# Patient Record
Sex: Male | Born: 1950
Health system: Southern US, Community
[De-identification: ages and names within clinical notes are randomized; demographics above are authoritative.]

## PROBLEM LIST (undated history)

## (undated) DIAGNOSIS — H409 Unspecified glaucoma: Secondary | ICD-10-CM

## (undated) DIAGNOSIS — T7840XA Allergy, unspecified, initial encounter: Secondary | ICD-10-CM

## (undated) DIAGNOSIS — M1990 Unspecified osteoarthritis, unspecified site: Secondary | ICD-10-CM

## (undated) DIAGNOSIS — I10 Essential (primary) hypertension: Secondary | ICD-10-CM

## (undated) DIAGNOSIS — H269 Unspecified cataract: Secondary | ICD-10-CM

## (undated) DIAGNOSIS — F1910 Other psychoactive substance abuse, uncomplicated: Secondary | ICD-10-CM

## (undated) DIAGNOSIS — G44009 Cluster headache syndrome, unspecified, not intractable: Secondary | ICD-10-CM

## (undated) DIAGNOSIS — N183 Chronic kidney disease, stage 3 (moderate): Secondary | ICD-10-CM

## (undated) HISTORY — DX: Allergy, unspecified, initial encounter: T78.40XA

## (undated) HISTORY — DX: Unspecified glaucoma: H40.9

## (undated) HISTORY — DX: Essential (primary) hypertension: I10

## (undated) HISTORY — DX: Other psychoactive substance abuse, uncomplicated: F19.10

## (undated) HISTORY — DX: Unspecified cataract: H26.9

## (undated) HISTORY — DX: Chronic kidney disease, stage 3 (moderate): N18.3

## (undated) HISTORY — DX: Unspecified osteoarthritis, unspecified site: M19.90

## (undated) HISTORY — PX: WISDOM TOOTH EXTRACTION: SHX21

---

## 2002-07-29 HISTORY — PX: OTHER SURGICAL HISTORY: SHX169

## 2008-04-22 ENCOUNTER — Ambulatory Visit: Payer: Self-pay | Admitting: Occupational Medicine

## 2008-04-22 DIAGNOSIS — I10 Essential (primary) hypertension: Secondary | ICD-10-CM | POA: Insufficient documentation

## 2008-04-22 HISTORY — DX: Essential (primary) hypertension: I10

## 2008-04-26 ENCOUNTER — Encounter (INDEPENDENT_AMBULATORY_CARE_PROVIDER_SITE_OTHER): Payer: Self-pay | Admitting: Occupational Medicine

## 2008-05-02 ENCOUNTER — Ambulatory Visit: Payer: Self-pay | Admitting: Family Medicine

## 2008-05-16 ENCOUNTER — Encounter: Payer: Self-pay | Admitting: Family Medicine

## 2008-05-17 LAB — CONVERTED CEMR LAB
Alkaline Phosphatase: 59 units/L (ref 39–117)
Cholesterol: 205 mg/dL — ABNORMAL HIGH (ref 0–200)
Glucose, Bld: 95 mg/dL (ref 70–99)
HDL: 39 mg/dL — ABNORMAL LOW (ref >39)
LDL Cholesterol: 137 mg/dL — ABNORMAL HIGH (ref 0–99)
LDL Goal: 130 mg/dL
Sodium: 140 meq/L (ref 135–145)
Total Bilirubin: 0.6 mg/dL (ref 0.3–1.2)
Total Protein: 7.5 g/dL (ref 6.0–8.3)
Triglycerides: 144 mg/dL (ref <150)
VLDL: 29 mg/dL (ref 0–40)

## 2009-04-26 ENCOUNTER — Ambulatory Visit: Payer: Self-pay | Admitting: Family Medicine

## 2009-04-27 LAB — CONVERTED CEMR LAB
BUN: 25 mg/dL — ABNORMAL HIGH (ref 6–23)
CO2: 21 meq/L (ref 19–32)
Calcium: 10 mg/dL (ref 8.4–10.5)
Chloride: 99 meq/L (ref 96–112)
Cholesterol: 208 mg/dL — ABNORMAL HIGH (ref 0–200)
Creatinine, Ser: 1.52 mg/dL — ABNORMAL HIGH (ref 0.40–1.50)
HDL: 44 mg/dL (ref >39)
LDL Cholesterol: 141 mg/dL — ABNORMAL HIGH (ref 0–99)
Total CHOL/HDL Ratio: 4.7
Triglycerides: 117 mg/dL (ref <150)

## 2009-05-04 ENCOUNTER — Encounter: Payer: Self-pay | Admitting: Family Medicine

## 2009-06-09 ENCOUNTER — Encounter: Payer: Self-pay | Admitting: Family Medicine

## 2009-06-09 HISTORY — PX: COLONOSCOPY: SHX174

## 2009-11-08 ENCOUNTER — Ambulatory Visit: Payer: Self-pay | Admitting: Family Medicine

## 2009-11-08 DIAGNOSIS — K219 Gastro-esophageal reflux disease without esophagitis: Secondary | ICD-10-CM

## 2009-11-08 HISTORY — DX: Gastro-esophageal reflux disease without esophagitis: K21.9

## 2010-08-28 NOTE — Assessment & Plan Note (Signed)
Summary: GERD   Vital Signs:  Patient profile:   60 year old male Height:      66.5 inches Weight:      209 pounds Pulse rate:   110 / minute BP sitting:   102 / 70  (left arm) Cuff size:   large  Vitals Entered By: Jose Waller (November 08, 2009 11:02 AM) CC: chest pain, heartburn, pain in left arm, sweat, nausea, flatulence happens alot after eating. Symptoms for 1 month now   Primary Care Provider:  Nani Gasser MD  CC:  chest pain, heartburn, pain in left arm, sweat, nausea, and flatulence happens alot after eating. Symptoms for 1 month now.  History of Present Illness: chest pain, heartburn, pain in left arm, sweat, nausea, flatulence happens alot after eating. Symptoms for 1 month now.  Cough when lasys down.  Occ gets pain down his Left arm.  Started a month ago.  Still very active and has not bothered him with activity. Often bothers him at night. No vomiting. Wrose  after eating pork skins last week.  Jose Waller had a cup of coffee to try to help himself burp.  Hx of reflux, but never this severe. Eats TUMS daily like they are candy. Do help but very brief. Also tried some Zantac but felt it didn't really work.  No hx of heart dz.    Current Medications (verified): 1)  Lisinopril-Hydrochlorothiazide 10-12.5 Mg Tabs (Lisinopril-Hydrochlorothiazide) .... Take One Tab Once Daily 2)  Pravastatin Sodium 20 Mg Tabs (Pravastatin Sodium) .... Take 1 Tablet By Mouth Once A Day At Bedtime  Allergies (verified): 1)  ! Prednisone  Comments:  Nurse/Medical Assistant: The patient's medications and allergies were reviewed with the patient and were updated in the Medication and Allergy Lists. Jose Waller (November 08, 2009 11:03 AM)  Family History: Reviewed history from 05/02/2008 and no changes required. alcholism,  Hi cholesterol  Father died Lung Ca, miner HTN Mom with dementia  Social History: Reviewed history from 04/26/2009 and no changes required. Married to Jose Waller with 2  kids. ON disability.   Former Smoker Alcohol use-no Regular exercise-yes  Physical Exam  General:  Well-developed,well-nourished,in no acute distress; alert,appropriate and cooperative throughout examination Head:  Normocephalic and atraumatic without obvious abnormalities. No apparent alopecia or balding. Mouth:  Oral mucosa and oropharynx without lesions or exudates.  Teeth in good repair. Neck:  No deformities, masses, or tenderness noted. Lungs:  Normal respiratory effort, chest expands symmetrically. Lungs are clear to auscultation, no crackles or wheezes. Heart:  Normal rate and regular rhythm. S1 and S2 normal without gallop, murmur, click, rub or other extra sounds. No carotid or abdominal bruits.   Abdomen:  Bowel sounds positive,abdomen soft and non-tender without masses, organomegaly or hernias noted. Skin:  no rashes.   Cervical Nodes:  No lymphadenopathy noted Psych:  Cognition and judgment appear intact. Alert and cooperative with normal attention span and concentration. No apparent delusions, illusions, hallucinations   Impression & Recommendations:  Problem # 1:  GERD (ICD-530.81) Unlikely cardiac but since was having pain radiating into his left arm did to an EKG. EKG shows rate 102 bpm, no acute changes.  Decreased amplitude in Lead 3, AVF.  This is reassuring.,  Discussed likley Severe GERD and his sxs are classic and eating certain foods make it worse.  He did feel relief after Gi cocktail here in teh office.  Dsicussed trial of PPI. Sample of Protonix given for 5 days and rx sent for omeprazole 40mg .  Dsicussed reflux measures. I not much better in one week then call back and will get labs and  consider stress test for his heart but this is less likely.  If does well on the PPI, then recommend tx for 8 weeks and then try to wean. Can use TUMS ore malloxx for acute reiief for the next few days.   His updated medication list for this problem includes:    Omeprazole 40 Mg  Cpdr (Omeprazole) .Marland Kitchen... Take 1 tablet by mouth once a day about 20-30 min before breakfast.  Problem # 2:  CHEST PAIN, ATYPICAL (ICD-786.59) See above.  Orders: EKG w/ Interpretation (93000)  Complete Medication List: 1)  Lisinopril-hydrochlorothiazide 10-12.5 Mg Tabs (Lisinopril-hydrochlorothiazide) .... Take one tab once daily 2)  Pravastatin Sodium 20 Mg Tabs (Pravastatin sodium) .... Take 1 tablet by mouth once a day at bedtime 3)  Omeprazole 40 Mg Cpdr (Omeprazole) .... Take 1 tablet by mouth once a day about 20-30 min before breakfast.  Patient Instructions: 1)  Avoid caffeine, greasy, spicey foods. Also avoid soda and carbonated beverages.  2)  Don't eat before bedtime.  Dont' overeat.  3)  Trial of omeprazole. CAll if not better in one week.  Prescriptions: OMEPRAZOLE 40 MG CPDR (OMEPRAZOLE) Take 1 tablet by mouth once a day about 20-30 min before breakfast.  #30 x 2   Entered and Authorized by:   Jose Gasser MD   Signed by:   Jose Gasser MD on 11/08/2009   Method used:   Electronically to        [facility] [phone]* (retail)       [address] [place], [place]  [address]       Ph: [phone]       Fax: [phone]   RxID:   [id number] LISINOPRIL-HYDROCHLOROTHIAZIDE 10-12.5 MG TABS (LISINOPRIL-HYDROCHLOROTHIAZIDE) take one tab once daily  #90 Each x 2   Entered by:   Jose Waller   Authorized by:   Jose Gasser MD   Signed by:   Jose Gasser MD on 11/08/2009   Method used:   Electronically to        [facility] [phone]* (retail)       [address] [place], [place]  [address]       Ph: [phone]       Fax: [phone]   RxID:   [id number] PRAVASTATIN SODIUM 20 MG TABS (PRAVASTATIN SODIUM) Take 1 tablet by mouth once a day at bedtime  #30 x 2   Entered by:   Jose Waller   Authorized by:   Jose Gasser MD   Signed by:   Jose Gasser MD on 11/08/2009   Method used:   Electronically to        [facility] [phone]* (retail)       [address] [place], [place]  [address]       Ph: [phone]       Fax: [phone]   RxID:   [id number]

## 2010-09-14 ENCOUNTER — Telehealth: Payer: Self-pay | Admitting: Family Medicine

## 2010-09-25 NOTE — Progress Notes (Signed)
Summary: Appt for bp med refill?  Phone Note Call from Patient Call back at [phone]   Caller: Patient Summary of Call: pt needs a refill for his bp meds, does he need to make an appt? Initial call taken by: Lannette Donath,  September 14, 2010 8:10 AM  Follow-up for Phone Call        Yes, will refill time one but needs to get in to office in the next couple of weeks.  Follow-up by: Nani Gasser MD,  September 14, 2010 1:08 PM  Additional Follow-up for Phone Call Additional follow up Details #1::        SW pt, advised meds are ready to be picked up, pt will CB to make appt, advised pt appt needs to be made for next refill Additional Follow-up by: Lannette Donath,  September 14, 2010 5:04 PM  New Problems: HYPERTENSION (ICD-401.1)   New Problems: HYPERTENSION (ICD-401.1)

## 2010-12-30 ENCOUNTER — Encounter: Payer: Self-pay | Admitting: Family Medicine

## 2010-12-31 ENCOUNTER — Ambulatory Visit (INDEPENDENT_AMBULATORY_CARE_PROVIDER_SITE_OTHER): Payer: Medicare Other | Admitting: Family Medicine

## 2010-12-31 ENCOUNTER — Encounter: Payer: Self-pay | Admitting: Family Medicine

## 2010-12-31 DIAGNOSIS — E785 Hyperlipidemia, unspecified: Secondary | ICD-10-CM

## 2010-12-31 DIAGNOSIS — I10 Essential (primary) hypertension: Secondary | ICD-10-CM

## 2010-12-31 HISTORY — DX: Hyperlipidemia, unspecified: E78.5

## 2010-12-31 MED ORDER — LISINOPRIL-HYDROCHLOROTHIAZIDE 10-12.5 MG PO TABS: 1.0000 | ORAL_TABLET | Freq: Every day | ORAL | Status: DC

## 2010-12-31 MED ORDER — PRAVASTATIN SODIUM 20 MG PO TABS: 20.0000 mg | ORAL_TABLET | Freq: Every day | ORAL | Status: DC

## 2010-12-31 NOTE — Assessment & Plan Note (Signed)
Not well controlled. Off of meds for 3 months. Reminded to get back on diet and stop drinking soda. Will restart meds and f/u in 1 month. He has joined a study in [facility] for HTN so they are drawing labs there and he will have them sent over.

## 2010-12-31 NOTE — Progress Notes (Signed)
  Subjective:    Patient ID: CAITLIN HILLMER, male    DOB: 11/17/1950, 60 y.o.   MRN: [id number]  Hypertension This is a chronic problem. The current episode started more than 1 year ago. The problem has been gradually worsening since onset. The problem is uncontrolled. Agents associated with hypertension include NSAIDs. Risk factors for coronary artery disease include no known risk factors. Past treatments include nothing. Compliance problems: out of meds for about 3 months.     Has been taking zyrtec for seasonal allergies.    Review of Systems     Objective:   Physical Exam  Constitutional: He is oriented to person, place, and time. He appears well-developed and well-nourished.  HENT:  Head: Normocephalic and atraumatic.  Eyes: Conjunctivae are normal. Pupils are equal, round, and reactive to light.  Cardiovascular: Normal rate, regular rhythm and normal heart sounds.   Pulmonary/Chest: Effort normal and breath sounds normal.  Musculoskeletal: Normal range of motion. He exhibits no edema.  Neurological: He is alert and oriented to person, place, and time.  Skin: Skin is warm and dry.  Psychiatric: He has a normal mood and affect.          Assessment & Plan:

## 2010-12-31 NOTE — Patient Instructions (Signed)
Please schedule a physical in one month.

## 2010-12-31 NOTE — Assessment & Plan Note (Signed)
Discussed restarting med and have them send me copy of labs from the research study.

## 2011-02-01 ENCOUNTER — Other Ambulatory Visit: Payer: Self-pay | Admitting: Family Medicine

## 2011-02-11 ENCOUNTER — Telehealth: Payer: Self-pay | Admitting: Family Medicine

## 2011-02-11 NOTE — Telephone Encounter (Signed)
I got his chol numbers from teh reasearch study. His chol was up.  Has he restarted his pravastatin?

## 2011-02-12 ENCOUNTER — Ambulatory Visit (INDEPENDENT_AMBULATORY_CARE_PROVIDER_SITE_OTHER): Payer: Medicare Other | Admitting: Family Medicine

## 2011-02-12 ENCOUNTER — Encounter: Payer: Self-pay | Admitting: Family Medicine

## 2011-02-12 DIAGNOSIS — N400 Enlarged prostate without lower urinary tract symptoms: Secondary | ICD-10-CM

## 2011-02-12 DIAGNOSIS — Z2911 Encounter for prophylactic immunotherapy for respiratory syncytial virus (RSV): Secondary | ICD-10-CM

## 2011-02-12 DIAGNOSIS — Z125 Encounter for screening for malignant neoplasm of prostate: Secondary | ICD-10-CM

## 2011-02-12 DIAGNOSIS — R350 Frequency of micturition: Secondary | ICD-10-CM

## 2011-02-12 DIAGNOSIS — Z23 Encounter for immunization: Secondary | ICD-10-CM

## 2011-02-12 DIAGNOSIS — Z0000 Encounter for general adult medical examination without abnormal findings: Secondary | ICD-10-CM

## 2011-02-12 NOTE — Progress Notes (Signed)
Addended by: Ellsworth Lennox on: 02/12/2011 09:32 AM   Modules accepted: Orders

## 2011-02-12 NOTE — Progress Notes (Signed)
Subjective:    Patient ID: Jose Waller, male    DOB: 12/19/1950, 60 y.o.   MRN: [id number]  HPI Here for CPE today. No specific complaints.  He is involved in a research study for HTN and is now on lisinopril. Says like the change in his BP med as he is not having to urinate less frequently.   Bp looks great today.  I just reviewed his lab results last night from the study. Chol was high.    Review of Systems Comprehensive ROS is neg.    BP 117/76  Pulse 80  Ht 5\' 7"  (1.702 m)  Wt 216 lb (97.977 kg)  BMI 33.83 kg/m2  SpO2 96%    Allergies  Allergen Reactions  . Prednisone     REACTION: hick ups    No past medical history on file.  Past Surgical History  Procedure Date  . Back lumbar 2004    fusion     History   Social History  . Marital Status: Married    Spouse Name: N/A    Number of Children: 2  . Years of Education: N/A   Occupational History  . Not on file.   Social History Main Topics  . Smoking status: Former Smoker    Quit date: 07/29/2001  . Smokeless tobacco: Not on file  . Alcohol Use: No  . Drug Use: Not on file     Former cocaine user, quit 1998  . Sexually Active: Not on file     married, 2 kids, on disability, regularly exercises.   Other Topics Concern  . Not on file   Social History Narrative   On disability.  Some exercise.     Family History  Problem Relation Age of Onset  . Lung cancer Father     lung/ was a Hydrologist  . Alcohol abuse Other   . Hypertension Brother   . Dementia Mother   . Coronary artery disease Brother     Jose Waller had no medications administered during this visit.     Objective:   Physical Exam  Constitutional: He is oriented to person, place, and time. He appears well-developed and well-nourished.  HENT:  Head: Normocephalic and atraumatic.  Right Ear: External ear normal.  Left Ear: External ear normal.  Nose: Nose normal.  Mouth/Throat: Oropharynx is clear and moist.  Eyes: Conjunctivae and EOM  are normal. Pupils are equal, round, and reactive to light.  Neck: Normal range of motion. Neck supple. No thyromegaly present.  Cardiovascular: Normal rate, regular rhythm, normal heart sounds and intact distal pulses.   Pulmonary/Chest: Effort normal and breath sounds normal.  Abdominal: Soft. Bowel sounds are normal. He exhibits no distension and no mass. There is no tenderness. There is no rebound and no guarding.  Genitourinary: Prostate is enlarged.       2+ enlarged. No nodules or bogginess.   Musculoskeletal: Normal range of motion.  Lymphadenopathy:    He has no cervical adenopathy.  Neurological: He is alert and oriented to person, place, and time. He has normal reflexes.  Skin: Skin is warm and dry.  Psychiatric: He has a normal mood and affect. His behavior is normal. Judgment and thought content normal.          Assessment & Plan:  CPE: Exam is nl except enlaged prostate Start a regular exercise program and make sure you are eating a healthy diet Your vaccines are up to date. Zostavax given today.  Need to restart  chole med and then recheck in about 2 months.

## 2011-02-12 NOTE — Patient Instructions (Signed)
Start a regular exercise program and make sure you are eating a healthy diet Please restart your cholesterol medicatoin.  Your vaccines are up to date.

## 2011-02-13 ENCOUNTER — Telehealth: Payer: Self-pay | Admitting: Family Medicine

## 2011-02-13 NOTE — Telephone Encounter (Signed)
Pt contacted and given recent lab result from the research study and told chol elev.  Asked the pt had he restarted the pravastatin and he said, "yes."  He restarted last night, and wasn't on the pravastatin when he had the study. Jarvis Newcomer, LPN [facility]

## 2011-02-13 NOTE — Telephone Encounter (Signed)
LMOM for pt that PSA normal and to recheck in 1 year. Jose Newcomer, LPN [facility]

## 2011-02-13 NOTE — Telephone Encounter (Signed)
Call pt: PSA is OK. Recheck in one year.

## 2011-04-17 ENCOUNTER — Other Ambulatory Visit: Payer: Self-pay | Admitting: Family Medicine

## 2011-12-30 ENCOUNTER — Other Ambulatory Visit: Payer: Self-pay | Admitting: Family Medicine

## 2012-07-16 ENCOUNTER — Other Ambulatory Visit: Payer: Self-pay | Admitting: Family Medicine

## 2012-07-16 NOTE — Telephone Encounter (Signed)
Must make appointment 

## 2013-01-17 ENCOUNTER — Emergency Department (INDEPENDENT_AMBULATORY_CARE_PROVIDER_SITE_OTHER)
Admission: EM | Admit: 2013-01-17 | Discharge: 2013-01-17 | Disposition: A | Discharge status: Home / Self Care | Payer: Medicare Other | Source: Home / Self Care | Attending: Emergency Medicine | Admitting: Emergency Medicine

## 2013-01-17 DIAGNOSIS — G44019 Episodic cluster headache, not intractable: Secondary | ICD-10-CM

## 2013-01-17 HISTORY — DX: Cluster headache syndrome, unspecified, not intractable: G44.009

## 2013-01-17 MED ORDER — METHYLPREDNISOLONE 4 MG PO KIT: PACK | ORAL | Status: DC

## 2013-01-17 MED ORDER — KETOROLAC TROMETHAMINE 60 MG/2ML IM SOLN
60.0000 mg | Freq: Once | INTRAMUSCULAR | Status: AC
  Administered 2013-01-17: 60 mg via INTRAMUSCULAR

## 2013-01-17 MED ORDER — IBUPROFEN 800 MG PO TABS: 800.0000 mg | ORAL_TABLET | Freq: Three times a day (TID) | ORAL | Status: DC | PRN

## 2013-01-17 NOTE — ED Provider Notes (Addendum)
History     CSN: [id number]  Arrival date & time 01/17/13  1153   First MD Initiated Contact with Patient-Sunday 01/17/13 1209      Chief Complaint  Patient presents with  . Migraine   Chief complaint: Acute Cluster migraine right side head  Patient is a 62 y.o. male presenting with migraines. The history is provided by the patient.  Migraine This is a recurrent problem. The current episode started 2 days ago. The problem occurs constantly. The problem has been gradually improving. Associated symptoms include headaches. Pertinent negatives include no chest pain, no abdominal pain and no shortness of breath. Associated symptoms comments: Other pertinent negatives: No blurred vision, scotomas, vision change, photophobia, phonophobia, nausea, vomiting, fever, numbness or focal weakness. No syncope. Exacerbated by: Eating chocolate, and cheese. And being outside in hot humid weather. Relieved by: Ibuprofen 200 mg helped somewhat, he reports Motrin 800 mg has worked well in the past.--Tried warm compresses and caffeinated coffee, and that helped somewhat. He has tried a warm compress for the symptoms. The treatment provided mild relief.   Acute onset of the deep, boring, right-sided parietal and orbital and maxillary headache. He awoke with this yesterday morning, which he states is typical for his prior cluster headaches in the past.--Last cluster headache was 8 years ago. He's gotten these episodically throughout his life every few years. He feels it was triggered by eating at a cookout, some foods containing tyramine as well as cheese and chocolate, and that's triggered his cluster headaches in the past. Pain reached a maximum 10 out of 10 when it awoke him from sleep early yesterday morning, but is now down to a 5/10 after using warm compresses and OTC ibuprofen. Associated symptoms: See above. Also, has congestion right nostril without discharge. He states this is typical for prior cluster  headaches .  Of note, he does not use alcohol,. He mentions cocaine addiction 20 years ago, and he quit over 10 years ago, so he requests to avoid any controlled, potentially addictive substances.  Past Medical History  Diagnosis Date  . Cluster headache     Past Surgical History  Procedure Laterality Date  . Back lumbar  2004    fusion     Family History  Problem Relation Age of Onset  . Lung cancer Father     lung/ was a Hydrologist  . Alcohol abuse Other   . Hypertension Brother   . Dementia Mother   . Coronary artery disease Brother     History  Substance Use Topics  . Smoking status: Former Smoker    Quit date: 07/29/2001  . Smokeless tobacco: Not on file  . Alcohol Use: No      Review of Systems  Constitutional: Negative for fever, chills and diaphoresis.  HENT: Positive for congestion. Negative for ear pain, sore throat, facial swelling, rhinorrhea, sneezing, trouble swallowing, neck pain, neck stiffness and postnasal drip.   Eyes: Positive for pain (Right). Negative for photophobia and visual disturbance.  Respiratory: Negative.  Negative for shortness of breath.   Cardiovascular: Negative.  Negative for chest pain.  Gastrointestinal: Negative.  Negative for nausea, vomiting and abdominal pain.  Genitourinary: Negative.   Allergic/Immunologic: Negative.   Neurological: Positive for headaches. Negative for dizziness, tremors, seizures, syncope, facial asymmetry, weakness and numbness.  Hematological: Negative.   Psychiatric/Behavioral: Negative.  Negative for hallucinations.    Allergies  Prednisone Associated with hiccoughs in the past. He specifically denies any history of allergic reaction, hives, swelling,  or itch when he took prednisone years ago.  Home Medications   Current Outpatient Rx  Name  Route  Sig  Dispense  Refill  . ibuprofen (ADVIL,MOTRIN) 800 MG tablet   Oral   Take 1 tablet (800 mg total) by mouth every 8 (eight) hours as needed for  pain. Take with food   21 tablet   0   . lisinopril (PRINIVIL,ZESTRIL) 5 MG tablet   Oral   Take 5 mg by mouth daily.           . methylPREDNISolone (MEDROL DOSEPAK) 4 MG tablet      Take as directed for 6 days   21 tablet   0   . omeprazole (PRILOSEC) 40 MG capsule      TAKE ONE CAPSULE BY MOUTH EVERY DAY ABOUT 20-30 MINUTES BEFORE BREAKFAST   30 capsule   0     Must make appointment before any further refills   . pravastatin (PRAVACHOL) 20 MG tablet      TAKE ONE TABLET BY MOUTH AT BEDTIME   30 tablet   3     BP 131/85  Pulse 96  Temp(Src) 98.1 F (36.7 C)  Ht 5' 6.5" (1.689 m)  Wt 222 lb (100.699 kg)  BMI 35.3 kg/m2  SpO2 97%  Physical Exam  Constitutional: He is oriented to person, place, and time. Vital signs are normal. He appears well-developed and well-nourished. He appears distressed (mild distress from R sided headache).  HENT:  Head: Normocephalic and atraumatic. Head is without contusion.  Right Ear: Hearing, tympanic membrane and ear canal normal.  Left Ear: Hearing, tympanic membrane and ear canal normal.  Nose: Nose normal.  Mouth/Throat: Oropharynx is clear and moist and mucous membranes are normal.  Eyes: EOM are normal. Pupils are equal, round, and reactive to light. Right eye exhibits no discharge. Left eye exhibits no discharge. No scleral icterus.  Fundoscopic exam:      The right eye shows no hemorrhage and no papilledema.       The left eye shows no hemorrhage and no papilledema.  Neck: Full passive range of motion without pain. Neck supple. No Brudzinski's sign and no Kernig's sign noted.  Cardiovascular: Normal rate and regular rhythm.   Pulmonary/Chest: Effort normal and breath sounds normal. No respiratory distress.  Neurological: He is alert and oriented to person, place, and time. He has normal strength and normal reflexes. No cranial nerve deficit or sensory deficit. Gait normal. GCS eye subscore is 4. GCS verbal subscore is 5. GCS  motor subscore is 6.  Psychiatric: He has a normal mood and affect. His speech is normal and behavior is normal. Thought content normal.    ED Course  Procedures (including critical care time)  Labs Reviewed - No data to display No results found.   1. Episodic cluster headache       MDM  Classical presentation for right-sided cluster headache. Consistent with prior diagnosis of cluster headaches in the past. No clinical evidence of any acute intracranial process. We discussed treatment options. Were avoiding any narcotic pain medication with his prior history of addiction. He mentions that Imitrex in the past cause chest pain, so we're avoiding Imitrex or other Tryptans at this time. After risks, benefits, alternatives discussed, he agrees with the following plans: Toradol 60 mg IM stat.--he was observed for over 15 min., without any side effects, and headache improved somewhat. Given that we're avoiding Tryptans, I explained that prednisone or similar corticosteroid in  a tapering dose is the first-line treatment to break a cluster headache.-We reviewed that he associated prednisone with hiccoughs in the past but no real allergic reaction.-- Therefore, after risk, benefits, alternatives discussed, I prescribed 6-day Medrol dose pack taper. I prescribed Motrin 800 mg Q8 hours PC. Prn headache, as this dosage worked in the past.-Precautions discussed. Red flags discussed. Other symptomatic care discussed.--discussed foods to avoid. Follow-up with PCP within 3 days, or go to ER sooner if any worsening or new symptoms. He voiced understanding and agreement.        Lajean Manes, MD 01/17/13 1534  Lajean Manes, MD 01/17/13 1539

## 2013-01-17 NOTE — ED Notes (Signed)
Hx of cluster headaches, this episode of headaches started about one week ago.  States it mimics a sinus infection.

## 2013-09-06 ENCOUNTER — Telehealth: Payer: Self-pay | Admitting: *Deleted

## 2013-09-06 NOTE — Telephone Encounter (Signed)
Pt informed that Dr. Linford ArnoldMetheney has been out of the office and will not be back until later on in the week. He stated that he needed a form filled out stating that he is disabled. I looked back in his chart to have another provider complete this for him and noticed that pt hasn't been seen by Dr. Linford ArnoldMetheney since 2012 which would mean that he will need to re-establish care. I informed him that he would need to make an appt either way due to the his circumstance we could not possibly complete this form w/o seeing him first. Pt voiced understanding and I offered him an appt w/ Dr. Benjamin Stainhekkekandam on 2/12 @ 1045. He will be seen then and told that we can complete the form at that time. Laureen Ochs.Mauria Asquith, Viann Shoveonya Lynetta

## 2013-09-08 ENCOUNTER — Ambulatory Visit (INDEPENDENT_AMBULATORY_CARE_PROVIDER_SITE_OTHER): Payer: Medicare Other | Admitting: Family Medicine

## 2013-09-08 ENCOUNTER — Encounter: Payer: Self-pay | Admitting: Family Medicine

## 2013-09-08 VITALS — BP 137/91 | HR 84 | Ht 67.0 in | Wt 229.0 lb

## 2013-09-08 DIAGNOSIS — G43909 Migraine, unspecified, not intractable, without status migrainosus: Secondary | ICD-10-CM

## 2013-09-08 DIAGNOSIS — K219 Gastro-esophageal reflux disease without esophagitis: Secondary | ICD-10-CM

## 2013-09-08 DIAGNOSIS — M545 Low back pain, unspecified: Secondary | ICD-10-CM

## 2013-09-08 DIAGNOSIS — I10 Essential (primary) hypertension: Secondary | ICD-10-CM

## 2013-09-08 DIAGNOSIS — E785 Hyperlipidemia, unspecified: Secondary | ICD-10-CM

## 2013-09-08 HISTORY — DX: Low back pain, unspecified: M54.50

## 2013-09-08 HISTORY — DX: Migraine, unspecified, not intractable, without status migrainosus: G43.909

## 2013-09-08 MED ORDER — IBUPROFEN 800 MG PO TABS: 800.0000 mg | ORAL_TABLET | Freq: Three times a day (TID) | ORAL | Status: DC | PRN

## 2013-09-08 MED ORDER — OMEPRAZOLE 40 MG PO CPDR: 40.0000 mg | DELAYED_RELEASE_CAPSULE | Freq: Every day | ORAL | Status: DC

## 2013-09-08 MED ORDER — PRAVASTATIN SODIUM 20 MG PO TABS: ORAL_TABLET | ORAL | Status: DC

## 2013-09-08 NOTE — Progress Notes (Signed)
CC: Jose Waller is a 63 y.o. male is here for form for disability   Subjective: HPI:  Patient is requesting property tax exclusion paperwork to be filled out for his total and permanent disability of low back pain despite multilevel lumbar fusion since 2005 he has been receiving benefits.  Requesting refills on omeprazole. States that his gastric reflux is 100% resolved when taking his medication on a daily basis. He states occasionally will skip a dose for a few days and notices symptoms returned described as moderate epigastric discomfort radiating up the back of the sternum without any exertional component.  Requesting refills on ibuprofen which she takes for migraines which he gets approximately "a few times"a month these are always precipitated due to chocolate or other cocoa containing products. Denies any motor or sensory disturbances during after or prior history migraines they are 100% alleviated soon after he takes ibuprofen.  Requesting refills on pravastatin for hyperlipidemia which is not had an LDL checked for over 3 months that he knows of he also has not been taking his medication for greater than 3-6 months. Denies any known side effects we'll he was taken his medication  He has a history of essential hypertension he is seeing nephrology at [facility] for a blood pressure study on chart review his blood pressures have been normotensive at all recent visits. He believes he gets his kidney function checked regularly however I cannot find documentation of this. Denies chest pain, shortness of breath, orthopnea nor peripheral edema     Review Of Systems Outlined In HPI  Past Medical History  Diagnosis Date  . Cluster headache     Past Surgical History  Procedure Laterality Date  . Back lumbar  2004    fusion    Family History  Problem Relation Age of Onset  . Lung cancer Father     lung/ was a Glass blower/designer  . Alcohol abuse Other   . Hypertension Brother   .  Dementia Mother   . Coronary artery disease Brother     History   Social History  . Marital Status: Married    Spouse Name: N/A    Number of Children: 2  . Years of Education: N/A   Occupational History  . Not on file.   Social History Main Topics  . Smoking status: Former Smoker    Quit date: 07/29/2001  . Smokeless tobacco: Not on file  . Alcohol Use: No  . Drug Use: Not on file     Comment: Former cocaine user, quit 1998  . Sexual Activity: Not on file     Comment: married, 2 kids, on disability, regularly exercises.   Other Topics Concern  . Not on file   Social History Narrative   On disability.  Some exercise.      Objective: BP 137/91  Pulse 84  Ht 5' 7"  (1.702 m)  Wt 229 lb (103.874 kg)  BMI 35.86 kg/m2  General: Alert and Oriented, No Acute Distress HEENT: Pupils equal, round, reactive to light. Conjunctivae clear.  Moist mucous membranes pharynx unremarkable Lungs: Clear to auscultation bilaterally, no wheezing/ronchi/rales.  Comfortable work of breathing. Good air movement. Cardiac: Regular rate and rhythm. Normal S1/S2.  No murmurs, rubs, nor gallops.   Abdomen: Soft nontender Extremities: No peripheral edema.  Strong peripheral pulses.  Mental Status: No depression, anxiety, nor agitation. Skin: Warm and dry.  Assessment & Plan: Jose Waller was seen today for form for disability.  Diagnoses and associated orders  for this visit:  GERD - omeprazole (PRILOSEC) 40 MG capsule; Take 1 capsule (40 mg total) by mouth daily.  HYPERTENSION  Migraine - ibuprofen (ADVIL,MOTRIN) 800 MG tablet; Take 1 tablet (800 mg total) by mouth every 8 (eight) hours as needed. Take with food  Low back pain  Hyperlipidemia  Other Orders - pravastatin (PRAVACHOL) 20 MG tablet; TAKE ONE TABLET BY MOUTH AT BEDTIME    GERD: Controlled when taking omeprazole therefore restart daily 40 mg Hypertension: Well controlled, if he cannot provide [place] with a basic metabolic panel at  his next visit would be wise for her office to obtain this since I cannot see it in care everywhere Migraine: Controlled continue as needed ibuprofen Hyperlipidemia: Restart pravastatin and return in 3 months for fasting cholesterol His property tax form requires a physician that has been caring for the patient prior to January 1 of this year in order for completion since this is the first time I've met this patient will leave the form for his PCP upon arrival.    Return in about 3 months (around 12/06/2013) for Cholesterol FU.

## 2013-09-09 ENCOUNTER — Ambulatory Visit: Payer: Medicare Other | Admitting: Sports Medicine

## 2013-09-22 ENCOUNTER — Telehealth: Payer: Self-pay | Admitting: *Deleted

## 2013-09-22 NOTE — Telephone Encounter (Signed)
Called and informed pt that form is complete he requested that it be mailed to him. I told him that this will take a little longer. Pt voiced understanding .Loralee PacasBarkley, Cheyann [place]

## 2013-12-20 ENCOUNTER — Other Ambulatory Visit: Payer: Self-pay | Admitting: Family Medicine

## 2014-09-29 DIAGNOSIS — R197 Diarrhea, unspecified: Secondary | ICD-10-CM | POA: Diagnosis not present

## 2014-09-29 DIAGNOSIS — R112 Nausea with vomiting, unspecified: Secondary | ICD-10-CM | POA: Diagnosis not present

## 2014-09-29 DIAGNOSIS — D72828 Other elevated white blood cell count: Secondary | ICD-10-CM | POA: Diagnosis not present

## 2014-09-29 DIAGNOSIS — I10 Essential (primary) hypertension: Secondary | ICD-10-CM | POA: Diagnosis not present

## 2014-09-29 DIAGNOSIS — R000 Tachycardia, unspecified: Secondary | ICD-10-CM | POA: Diagnosis not present

## 2014-09-29 DIAGNOSIS — Z888 Allergy status to other drugs, medicaments and biological substances status: Secondary | ICD-10-CM | POA: Diagnosis not present

## 2014-09-29 DIAGNOSIS — K5730 Diverticulosis of large intestine without perforation or abscess without bleeding: Secondary | ICD-10-CM | POA: Diagnosis not present

## 2014-09-29 DIAGNOSIS — K429 Umbilical hernia without obstruction or gangrene: Secondary | ICD-10-CM | POA: Diagnosis not present

## 2015-04-19 ENCOUNTER — Ambulatory Visit (INDEPENDENT_AMBULATORY_CARE_PROVIDER_SITE_OTHER): Payer: Medicare Other | Admitting: Family Medicine

## 2015-04-19 ENCOUNTER — Encounter: Payer: Self-pay | Admitting: Family Medicine

## 2015-04-19 VITALS — BP 136/86 | HR 86 | Temp 98.4°F | Ht 67.0 in | Wt 230.0 lb

## 2015-04-19 DIAGNOSIS — K219 Gastro-esophageal reflux disease without esophagitis: Secondary | ICD-10-CM | POA: Diagnosis not present

## 2015-04-19 DIAGNOSIS — Z125 Encounter for screening for malignant neoplasm of prostate: Secondary | ICD-10-CM | POA: Diagnosis not present

## 2015-04-19 DIAGNOSIS — I10 Essential (primary) hypertension: Secondary | ICD-10-CM

## 2015-04-19 DIAGNOSIS — E785 Hyperlipidemia, unspecified: Secondary | ICD-10-CM

## 2015-04-19 DIAGNOSIS — Z0000 Encounter for general adult medical examination without abnormal findings: Secondary | ICD-10-CM | POA: Diagnosis not present

## 2015-04-19 LAB — COMPLETE METABOLIC PANEL WITH GFR
ALK PHOS: 67 U/L (ref 40–115)
ALT: 11 U/L (ref 9–46)
AST: 15 U/L (ref 10–35)
Albumin: 4.2 g/dL (ref 3.6–5.1)
BUN: 17 mg/dL (ref 7–25)
CHLORIDE: 105 mmol/L (ref 98–110)
CO2: 26 mmol/L (ref 20–31)
Calcium: 9.2 mg/dL (ref 8.6–10.3)
Creat: 1.3 mg/dL — ABNORMAL HIGH (ref 0.70–1.25)
GFR, EST NON AFRICAN AMERICAN: 57 mL/min — AB (ref >=60)
GFR, Est African American: 66 mL/min (ref >=60)
GLUCOSE: 79 mg/dL (ref 65–99)
Potassium: 4.6 mmol/L (ref 3.5–5.3)
SODIUM: 138 mmol/L (ref 135–146)
Total Bilirubin: 0.4 mg/dL (ref 0.2–1.2)
Total Protein: 6.7 g/dL (ref 6.1–8.1)

## 2015-04-19 LAB — LIPID PANEL
Cholesterol: 178 mg/dL (ref 125–200)
HDL: 36 mg/dL — AB (ref >=40)
LDL Cholesterol: 125 mg/dL (ref <130)
Total CHOL/HDL Ratio: 4.9 Ratio (ref <=5.0)
Triglycerides: 84 mg/dL (ref <150)
VLDL: 17 mg/dL (ref <30)

## 2015-04-19 MED ORDER — LISINOPRIL 5 MG PO TABS: 5.0000 mg | ORAL_TABLET | Freq: Every day | ORAL | Status: DC

## 2015-04-19 MED ORDER — OMEPRAZOLE 40 MG PO CPDR: 40.0000 mg | DELAYED_RELEASE_CAPSULE | Freq: Every day | ORAL | Status: DC

## 2015-04-19 NOTE — Patient Instructions (Signed)
Keep up a regular exercise program and make sure you are eating a healthy diet Try to eat 4 servings of dairy a day, or if you are lactose intolerant take a calcium with vitamin D daily.  Your vaccines are up to date.   

## 2015-04-19 NOTE — Progress Notes (Signed)
Subjective:    Patient ID: Jose Waller, male    DOB: 10/13/1950, 64 y.o.   MRN: [id number]  HPI  for complete physical today. He has not been here for several years. He was actively part of a research study through [facility] where he was actually getting regular care including regular checkups blood work etc. He does still take blood pressure medication as well as reflux medication. Unfortunately his wife was killed in a head-on car accident back in January. He is still going to grief counseling and therapy. He is now raising his 2 daughters by himself. One is in college at [employer] and is getting a marketing degree and doing well. The second is in high school at the [employer]. He also recently joined [employer] so that he can learn to speak more publicly about grieving and loss.   Review of Systems  hypertensive review of systems is negative.    Objective:   Physical Exam  Constitutional: He is oriented to person, place, and time. He appears well-developed and well-nourished.  HENT:  Head: Normocephalic and atraumatic.  Right Ear: External ear normal.  Left Ear: External ear normal.  Nose: Nose normal.  Mouth/Throat: Oropharynx is clear and moist.  Eyes: Conjunctivae and EOM are normal. Pupils are equal, round, and reactive to light.  Neck: Normal range of motion. Neck supple. No thyromegaly present.  Cardiovascular: Normal rate, regular rhythm, normal heart sounds and intact distal pulses.   No carotid bruits.   Pulmonary/Chest: Effort normal and breath sounds normal.  Abdominal: Soft. Bowel sounds are normal. He exhibits no distension and no mass. There is no tenderness. There is no rebound and no guarding.  Musculoskeletal: Normal range of motion.  Lymphadenopathy:    He has no cervical adenopathy.  Neurological: He is alert and oriented to person, place, and time. He has normal reflexes.  Skin: Skin is warm and dry.  Psychiatric: He has a normal mood  and affect. His behavior is normal. Judgment and thought content normal.          Assessment & Plan:   complete physical-  He is doing well. Due for fasting CMP and lipid panel as well as a PSA.  Declined pneumonia vaccination today. He says he will think about it.  Tetanus and shingles vaccie is up-to-date.   Hypertension-well-controlled continue current regimen. Follow-up in 6 months. Next  Hyperlipidemia-due to recheck lipid panel.  GERD-refilled his reflux medication.  Subjective:    [name] is a 64 y.o. male who presents for Medicare Annual/Subsequent preventive examination.   Preventive Screening-Counseling & Management  Tobacco History  Smoking status  . Former Smoker  . Quit date: 07/29/2001  Smokeless tobacco  . Not on file    Problems Prior to Visit 1.   Current Problems (verified) Patient Active Problem List   Diagnosis Date Noted  . Migraine 09/08/2013  . Low back pain 09/08/2013  . Hyperlipidemia 12/31/2010  . GERD 11/08/2009  . HYPERTENSION 04/22/2008    Medications Prior to Visit No current outpatient prescriptions on file prior to visit.   No current facility-administered medications on file prior to visit.    Current Medications (verified) Current Outpatient Prescriptions  Medication Sig Dispense Refill  . lisinopril (PRINIVIL,ZESTRIL) 5 MG tablet Take 1 tablet (5 mg total) by mouth daily. 90 tablet 1  . omeprazole (PRILOSEC) 40 MG capsule Take 1 capsule (40 mg total) by mouth daily. 90 capsule 1   No  current facility-administered medications for this visit.     Allergies (verified) Prednisone   PAST HISTORY  Family History Family History  Problem Relation Age of Onset  . Lung cancer Father     lung/ was a Hydrologist  . Alcohol abuse Other   . Hypertension Brother   . Dementia Mother   . Coronary artery disease Brother     Social History Social History  Substance Use Topics  . Smoking status: Former Smoker    Quit  date: 07/29/2001  . Smokeless tobacco: Not on file  . Alcohol Use: No    Are there smokers in your home (other than you)?  No  Risk Factors Current exercise habits: The patient does not participate in regular exercise at present.  Dietary issues discussed: None   Cardiac risk factors: advanced age (older than 39 for men, 59 for women), hypertension, male gender, obesity (BMI >= 30 kg/m2) and sedentary lifestyle.  Depression Screen (Note: if answer to either of the following is "Yes", a more complete depression screening is indicated)   Q1: Over the past two weeks, have you felt down, depressed or hopeless? No  Q2: Over the past two weeks, have you felt little interest or pleasure in doing things? No  Have you lost interest or pleasure in daily life? No  Do you often feel hopeless? No  Do you cry easily over simple problems? No  Activities of Daily Living In your present state of health, do you have any difficulty performing the following activities?:  Driving? No Managing money?  No Feeding yourself? No Getting from bed to chair? No Climbing a flight of stairs? No Preparing food and eating?: No Bathing or showering? No Getting dressed: No Getting to the toilet? No Using the toilet:No Moving around from place to place: No In the past year have you fallen or had a near fall?:No   Are you sexually active?  No  Do you have more than one partner?  No  Hearing Difficulties: No Do you often ask people to speak up or repeat themselves? No Do you experience ringing or noises in your ears? No Do you have difficulty understanding soft or whispered voices? No   Do you feel that you have a problem with memory? No  Do you often misplace items? No  Do you feel safe at home?  Yes  Cognitive Testing  Alert? Yes  Normal Appearance?Yes  Oriented to person? Yes  Place? Yes   Time? Yes  Recall of three objects?  Yes  Can perform simple calculations? Yes  Displays appropriate  judgment?Yes  Can read the correct time from a watch face?Yes   Advanced Directives have been discussed with the patient? Yes, given additional info.    List the Names of Other Physician/Practitioners you currently use: 1.    Indicate any recent Medical Services you may have received from other than [facility] providers in the past year (date may be approximate).  Immunization History  Administered Date(s) Administered  . Td 05/02/2008  . Zoster 02/12/2011    Screening Tests Health Maintenance  Topic Date Due  . Hepatitis C Screening  05-02-1951  . INFLUENZA VACCINE  04/18/2016 (Originally 02/27/2015)  . PNA vac Low Risk Adult (1 of 2 - PCV13) 04/18/2016 (Originally 12/13/2013)  . TETANUS/TDAP  05/02/2018  . COLONOSCOPY  06/10/2019  . ZOSTAVAX  Completed    All answers were reviewed with the patient and necessary referrals were made:  METHENEY,CATHERINE, MD   04/19/2015  History reviewed: allergies, current medications, past family history, past medical history, past social history, past surgical history and problem list  Review of Systems A comprehensive review of systems was negative.    Objective:     Vision by Snellen chart: right eye:20/25, left eye:20/20 Blood pressure 136/86, pulse 86, temperature 98.4 F (36.9 C), height  (1.702 m), weight 230 lb (104.327 kg), SpO2 96 %. Body mass index is 36.01 kg/(m^2).  BP 136/86 mmHg  Pulse 86  Temp(Src) 98.4 F (36.9 C)  Ht  (1.702 m)  Wt 230 lb (104.327 kg)  BMI 36.01 kg/m2  SpO2 96%  General Appearance:    Alert, cooperative, no distress, appears stated age  Head:    Normocephalic, without obvious abnormality, atraumatic  Eyes:    PERRL, conjunctiva/corneas clear, EOM's intact, benign, both eyes       Ears:    Normal TM's and external ear canals, both ears  Nose:   Nares normal, septum midline, mucosa normal, no drainage    or sinus tenderness  Throat:   Lips, mucosa, and tongue normal; teeth and gums normal   Neck:   Supple, symmetrical, trachea midline, no adenopathy;       thyroid:  No enlargement/tenderness/nodules; no carotid   bruit or JVD  Back:     Symmetric, no curvature, ROM normal, no CVA tenderness  Lungs:     Clear to auscultation bilaterally, respirations unlabored  Chest wall:    No tenderness or deformity  Heart:    Regular rate and rhythm, S1 and S2 normal, no murmur, rub   or gallop, no carotid bruits.   Abdomen:     Soft, non-tender, bowel sounds active all four quadrants,    no masses, no organomegaly  Genitalia:    Not examined  Rectal:    Not examined  Extremities:   Extremities normal, atraumatic, no cyanosis or edema  Pulses:   2+ and symmetric all extremities  Skin:   Skin color, texture, turgor normal, no rashes or lesions  Lymph nodes:   Cervical, supraclavicular,nodes normal  Neurologic:   CNII-XII intact. Normal strength, sensation and reflexes      throughout       Assessment:   Annual Medicare Wellness       Plan:     During the course of the visit the patient was educated and counseled about appropriate screening and preventive services including:    Pneumococcal vaccine   Influenza vaccine  Prostate cancer screening  Diet review for nutrition referral? Yes ____  Not Indicated _X__   Patient Instructions (the written plan) was given to the patient.  Medicare Attestation I have personally reviewed: The patient's medical and social history Their use of alcohol, tobacco or illicit drugs Their current medications and supplements The patient's functional ability including ADLs,fall risks, home safety risks, cognitive, and hearing and visual impairment Diet and physical activities Evidence for depression or mood disorders  The patient's weight, height, BMI, and visual acuity have been recorded in the chart.  I have made referrals, counseling, and provided education to the patient based on review of the above and I have provided the patient with  a written personalized care plan for preventive services.     METHENEY,CATHERINE, MD   04/19/2015

## 2015-04-20 LAB — PSA: PSA: 1.57 ng/mL (ref <=4.00)

## 2015-08-28 ENCOUNTER — Other Ambulatory Visit: Payer: Self-pay | Admitting: Family Medicine

## 2015-10-18 ENCOUNTER — Ambulatory Visit: Payer: Self-pay | Admitting: Family Medicine

## 2015-10-19 ENCOUNTER — Encounter: Payer: Self-pay | Admitting: Family Medicine

## 2015-10-19 ENCOUNTER — Ambulatory Visit (INDEPENDENT_AMBULATORY_CARE_PROVIDER_SITE_OTHER): Payer: Medicare Other | Admitting: Family Medicine

## 2015-10-19 VITALS — BP 107/79 | HR 101 | Wt 226.0 lb

## 2015-10-19 DIAGNOSIS — Z1159 Encounter for screening for other viral diseases: Secondary | ICD-10-CM

## 2015-10-19 DIAGNOSIS — K219 Gastro-esophageal reflux disease without esophagitis: Secondary | ICD-10-CM

## 2015-10-19 DIAGNOSIS — I10 Essential (primary) hypertension: Secondary | ICD-10-CM

## 2015-10-19 DIAGNOSIS — M545 Low back pain, unspecified: Secondary | ICD-10-CM

## 2015-10-19 MED ORDER — LISINOPRIL 5 MG PO TABS: 5.0000 mg | ORAL_TABLET | Freq: Every day | ORAL | Status: DC

## 2015-10-19 MED ORDER — OMEPRAZOLE 40 MG PO CPDR: 40.0000 mg | DELAYED_RELEASE_CAPSULE | Freq: Every day | ORAL | Status: DC

## 2015-10-19 MED ORDER — IBUPROFEN 800 MG PO TABS: 800.0000 mg | ORAL_TABLET | Freq: Three times a day (TID) | ORAL | Status: DC | PRN

## 2015-10-19 NOTE — Progress Notes (Signed)
   Subjective:    Patient ID: Jose Waller, male    DOB: 1950-10-16, 65 y.o.   MRN: [id number]  HPI Hypertension- Pt denies chest pain, SOB, dizziness, or heart palpitations.  Taking meds as directed w/o problems.  Denies medication side effects.    GERD - restarted his PPI a year ago.  He does have that that seems to control his reflux symptoms. He has been having more allergy symptoms recently but says he's been taking some over-the-counter MSM and that really seems to help his symptoms.  Low Back pain - He would like a refill on his ibuprofen. It does provide relief for him.  Review of Systems     Objective:   Physical Exam  Constitutional: He is oriented to person, place, and time. He appears well-developed and well-nourished.  HENT:  Head: Normocephalic and atraumatic.  Cardiovascular: Normal rate, regular rhythm and normal heart sounds.   Pulmonary/Chest: Effort normal and breath sounds normal.  Neurological: He is alert and oriented to person, place, and time.  Skin: Skin is warm and dry.  Psychiatric: He has a normal mood and affect. His behavior is normal.          Assessment & Plan:  HTN - Due for BMP. Well controlled. Continue current regimen. Follow up in 6 mo.   GERD- continue with PPI for symptom relief.  Low Back pain - did refill his ibuprofen today. Stop immediately if any GI upset or irritation.

## 2015-10-20 LAB — BASIC METABOLIC PANEL
BUN: 15 mg/dL (ref 7–25)
CHLORIDE: 104 mmol/L (ref 98–110)
CO2: 28 mmol/L (ref 20–31)
Calcium: 9.5 mg/dL (ref 8.6–10.3)
Creat: 1.31 mg/dL — ABNORMAL HIGH (ref 0.70–1.25)
Glucose, Bld: 82 mg/dL (ref 65–99)
Potassium: 4.5 mmol/L (ref 3.5–5.3)
Sodium: 139 mmol/L (ref 135–146)

## 2015-10-20 LAB — HEPATITIS C ANTIBODY: HCV AB: NEGATIVE

## 2016-04-22 ENCOUNTER — Encounter: Payer: Self-pay | Admitting: Family Medicine

## 2016-05-08 ENCOUNTER — Encounter: Payer: Self-pay | Admitting: Family Medicine

## 2016-05-08 ENCOUNTER — Ambulatory Visit (INDEPENDENT_AMBULATORY_CARE_PROVIDER_SITE_OTHER): Payer: Self-pay | Admitting: Family Medicine

## 2016-05-08 VITALS — BP 135/74 | HR 88 | Wt 226.0 lb

## 2016-05-08 DIAGNOSIS — I10 Essential (primary) hypertension: Secondary | ICD-10-CM

## 2016-05-08 DIAGNOSIS — Z125 Encounter for screening for malignant neoplasm of prostate: Secondary | ICD-10-CM

## 2016-05-08 DIAGNOSIS — K219 Gastro-esophageal reflux disease without esophagitis: Secondary | ICD-10-CM

## 2016-05-08 DIAGNOSIS — E785 Hyperlipidemia, unspecified: Secondary | ICD-10-CM

## 2016-05-08 MED ORDER — IBUPROFEN 800 MG PO TABS: 800.0000 mg | ORAL_TABLET | Freq: Three times a day (TID) | ORAL | 1 refills | Status: DC | PRN

## 2016-05-08 MED ORDER — OMEPRAZOLE 40 MG PO CPDR: 40.0000 mg | DELAYED_RELEASE_CAPSULE | Freq: Every day | ORAL | 1 refills | Status: DC

## 2016-05-08 MED ORDER — LISINOPRIL 5 MG PO TABS: 5.0000 mg | ORAL_TABLET | Freq: Every day | ORAL | 1 refills | Status: DC

## 2016-05-08 NOTE — Progress Notes (Signed)
Subjective:    CC: HTN  HPI:  Hypertension- Pt denies chest pain, SOB, dizziness, or heart palpitations.  Taking meds as directed w/o problems.  Denies medication side effects.    GERD- on prilosec 40mg  daily. Needs refill. Sxs well controlled.   Past medical history, Surgical history, Family history not pertinant except as noted below, Social history, Allergies, and medications have been entered into the medical record, reviewed, and corrections made.   Review of Systems: No fevers, chills, night sweats, weight loss, chest pain, or shortness of breath.   Objective:    General: Well Developed, well nourished, and in no acute distress.  Neuro: Alert and oriented x3, extra-ocular muscles intact, sensation grossly intact.  HEENT: Normocephalic, atraumatic  Skin: Warm and dry, no rashes. Cardiac: Regular rate and rhythm, no murmurs rubs or gallops, no lower extremity edema.  Respiratory: Clear to auscultation bilaterally. Not using accessory muscles, speaking in full sentences.   Impression and Recommendations:    HTN - Well controlled. Continue current regimen. Follow up in  6 mo.  Due for CMP and lipids.  GERD- refill med.   Hyperlipidemia-due to recheck lipid levels.  Due for PSA for prostate cancer screening.

## 2016-05-15 DIAGNOSIS — E785 Hyperlipidemia, unspecified: Secondary | ICD-10-CM | POA: Diagnosis not present

## 2016-05-15 DIAGNOSIS — Z125 Encounter for screening for malignant neoplasm of prostate: Secondary | ICD-10-CM | POA: Diagnosis not present

## 2016-05-15 DIAGNOSIS — I10 Essential (primary) hypertension: Secondary | ICD-10-CM | POA: Diagnosis not present

## 2016-05-16 LAB — LIPID PANEL
Cholesterol: 186 mg/dL (ref 125–200)
HDL: 37 mg/dL — ABNORMAL LOW (ref >=40)
LDL CALC: 131 mg/dL — AB (ref <130)
TRIGLYCERIDES: 90 mg/dL (ref <150)
Total CHOL/HDL Ratio: 5 Ratio (ref <=5.0)
VLDL: 18 mg/dL (ref <30)

## 2016-05-16 LAB — COMPLETE METABOLIC PANEL WITH GFR
ALBUMIN: 4.1 g/dL (ref 3.6–5.1)
ALT: 10 U/L (ref 9–46)
AST: 15 U/L (ref 10–35)
Alkaline Phosphatase: 71 U/L (ref 40–115)
BILIRUBIN TOTAL: 0.4 mg/dL (ref 0.2–1.2)
BUN: 19 mg/dL (ref 7–25)
CALCIUM: 9.3 mg/dL (ref 8.6–10.3)
CHLORIDE: 107 mmol/L (ref 98–110)
CO2: 24 mmol/L (ref 20–31)
Creat: 1.36 mg/dL — ABNORMAL HIGH (ref 0.70–1.25)
GFR, EST NON AFRICAN AMERICAN: 53 mL/min — AB (ref >=60)
GFR, Est African American: 62 mL/min (ref >=60)
Glucose, Bld: 93 mg/dL (ref 65–99)
POTASSIUM: 4.1 mmol/L (ref 3.5–5.3)
SODIUM: 140 mmol/L (ref 135–146)
Total Protein: 6.9 g/dL (ref 6.1–8.1)

## 2016-05-16 LAB — PSA: PSA: 1.5 ng/mL (ref <=4.0)

## 2016-06-05 ENCOUNTER — Telehealth: Payer: Self-pay | Admitting: Family Medicine

## 2016-06-05 NOTE — Telephone Encounter (Signed)
Pt stated he needs refills on BP MEDS

## 2016-06-05 NOTE — Telephone Encounter (Signed)
Called and informed pt that this was sent on 05/08/16 for #[id number].Jose PacasBarkley, [name]

## 2016-08-08 ENCOUNTER — Ambulatory Visit: Payer: Self-pay | Admitting: Family Medicine

## 2016-09-16 ENCOUNTER — Other Ambulatory Visit: Payer: Self-pay | Admitting: Family Medicine

## 2016-10-16 ENCOUNTER — Ambulatory Visit (INDEPENDENT_AMBULATORY_CARE_PROVIDER_SITE_OTHER): Payer: Medicare Other | Admitting: Family Medicine

## 2016-10-16 ENCOUNTER — Encounter: Payer: Self-pay | Admitting: Family Medicine

## 2016-10-16 VITALS — BP 137/87 | HR 95 | Ht 67.0 in | Wt 219.0 lb

## 2016-10-16 DIAGNOSIS — I10 Essential (primary) hypertension: Secondary | ICD-10-CM

## 2016-10-16 DIAGNOSIS — Z6834 Body mass index (BMI) 34.0-34.9, adult: Secondary | ICD-10-CM

## 2016-10-16 LAB — BASIC METABOLIC PANEL WITH GFR
BUN: 17 mg/dL (ref 7–25)
CALCIUM: 9.8 mg/dL (ref 8.6–10.3)
CO2: 28 mmol/L (ref 20–31)
CREATININE: 1.34 mg/dL — AB (ref 0.70–1.25)
Chloride: 105 mmol/L (ref 98–110)
GFR, EST NON AFRICAN AMERICAN: 54 mL/min — AB (ref >=60)
GFR, Est African American: 63 mL/min (ref >=60)
Glucose, Bld: 88 mg/dL (ref 65–99)
Potassium: 4.6 mmol/L (ref 3.5–5.3)
SODIUM: 140 mmol/L (ref 135–146)

## 2016-10-16 MED ORDER — LISINOPRIL 10 MG PO TABS: 10.0000 mg | ORAL_TABLET | Freq: Every day | ORAL | 1 refills | Status: DC

## 2016-10-16 NOTE — Progress Notes (Signed)
Subjective:    CC: HTN  HPI: Hypertension- Pt denies chest pain, SOB, dizziness, or heart palpitations.  Taking meds as directed w/o problems.  Denies medication side effects.    Obesity/BMI 34-he has actually lost about 7 pounds since I last saw him in October which is absolutely fantastic. Continue work on [employer] diet and regular exercise.  Past medical history, Surgical history, Family history not pertinant except as noted below, Social history, Allergies, and medications have been entered into the medical record, reviewed, and corrections made.   Review of Systems: No fevers, chills, night sweats, weight loss, chest pain, or shortness of breath.   Objective:    General: Well Developed, well nourished, and in no acute distress.  Neuro: Alert and oriented x3, extra-ocular muscles intact, sensation grossly intact.  HEENT: Normocephalic, atraumatic  Skin: Warm and dry, no rashes. Cardiac: Regular rate and rhythm, no murmurs rubs or gallops, no lower extremity edema.  Respiratory: Clear to auscultation bilaterally. Not using accessory muscles, speaking in full sentences.   Impression and Recommendations:   HTN - Well controlled overall but blood pressures have been running in the 130s. Like to increase his Toprol to 10 mg just to get that pressure pushed down just a little bit more. Recheck BMP today. Follow-up in 6 months.  BMI 34-great job on weight loss.

## 2016-11-07 ENCOUNTER — Ambulatory Visit: Payer: Medicare Other | Admitting: Family Medicine

## 2016-12-02 ENCOUNTER — Other Ambulatory Visit: Payer: Self-pay | Admitting: Family Medicine

## 2016-12-02 DIAGNOSIS — K219 Gastro-esophageal reflux disease without esophagitis: Secondary | ICD-10-CM

## 2017-02-23 ENCOUNTER — Other Ambulatory Visit: Payer: Self-pay | Admitting: Family Medicine

## 2017-04-09 ENCOUNTER — Other Ambulatory Visit: Payer: Self-pay | Admitting: Family Medicine

## 2017-04-09 ENCOUNTER — Ambulatory Visit: Payer: Self-pay | Admitting: Family Medicine

## 2017-04-09 DIAGNOSIS — I10 Essential (primary) hypertension: Secondary | ICD-10-CM

## 2017-04-22 ENCOUNTER — Ambulatory Visit (INDEPENDENT_AMBULATORY_CARE_PROVIDER_SITE_OTHER): Payer: Medicare Other | Admitting: Family Medicine

## 2017-04-22 ENCOUNTER — Encounter: Payer: Self-pay | Admitting: Family Medicine

## 2017-04-22 VITALS — BP 136/82 | HR 80 | Temp 98.3°F | Ht 67.0 in | Wt 223.0 lb

## 2017-04-22 DIAGNOSIS — E785 Hyperlipidemia, unspecified: Secondary | ICD-10-CM

## 2017-04-22 DIAGNOSIS — I10 Essential (primary) hypertension: Secondary | ICD-10-CM

## 2017-04-22 DIAGNOSIS — R0981 Nasal congestion: Secondary | ICD-10-CM | POA: Diagnosis not present

## 2017-04-22 NOTE — Progress Notes (Signed)
Subjective:    Patient ID: Jose Waller, male    DOB: 19-Sep-1950, 66 y.o.   MRN: [id number]  HPI Hypertension- Pt denies chest pain, SOB, dizziness, or heart palpitations.  Taking meds as directed w/o problems.  Denies medication side effects.    Hyperlipidemia - Not currently on statin.   Has had some sinus sxs for one days. Has had eye pressure and has been taking zyrtec. He says the Zyrtec makes him feel little groggy when he takes it. No cough. No fever, chills ore sweats.   Review of Systems  BP 136/82   Pulse 80   Temp 98.3 F (36.8 C)   Ht  (1.702 m)   Wt 223 lb (101.2 kg)   SpO2 98%   BMI 34.93 kg/m     Allergies  Allergen Reactions  . Prednisone     REACTION: hick ups    Past Medical History:  Diagnosis Date  . Cluster headache     Past Surgical History:  Procedure Laterality Date  . back lumbar  2004   fusion     Social History   Social History  . Marital status: Widowed    Spouse name: N/A  . Number of children: 2  . Years of education: N/A   Occupational History  . Not on file.   Social History Main Topics  . Smoking status: Former Smoker    Quit date: 07/29/2001  . Smokeless tobacco: Never Used  . Alcohol use No  . Drug use: Unknown     Comment: Former cocaine user, quit 1998  . Sexual activity: Not on file     Comment: married, 2 kids, on disability, regularly exercises.   Other Topics Concern  . Not on file   Social History Narrative   On disability.  Some exercise. Wife passed away in a motor vehicle accident. He is raising 2 daughters by himself.    Family History  Problem Relation Age of Onset  . Lung cancer Father        lung/ was a Hydrologist  . Alcohol abuse Other   . Hypertension Brother   . Dementia Mother   . Coronary artery disease Brother     Outpatient Encounter Prescriptions as of 04/22/2017  Medication Sig  . ibuprofen (ADVIL,MOTRIN) 800 MG tablet TAKE ONE TABLET BY MOUTH EVERY 8 HOURS WITH FOOD AS NEEDED  .  lisinopril (PRINIVIL,ZESTRIL) 10 MG tablet TAKE 1 TABLET BY MOUTH ONCE DAILY  . omeprazole (PRILOSEC) 40 MG capsule TAKE ONE CAPSULE BY MOUTH ONCE DAILY  . [DISCONTINUED] ibuprofen (ADVIL,MOTRIN) 800 MG tablet TAKE ONE TABLET BY MOUTH EVERY 8 HOURS WITH FOOD AS NEEDED   No facility-administered encounter medications on file as of 04/22/2017.          Objective:   Physical Exam  Constitutional: He is oriented to person, place, and time. He appears well-developed and well-nourished.  HENT:  Head: Normocephalic and atraumatic.  Right Ear: External ear normal.  Left Ear: External ear normal.  Nose: Nose normal.  Mouth/Throat: Oropharynx is clear and moist.  TMs and canals are clear.   Eyes: Pupils are equal, round, and reactive to light. Conjunctivae and EOM are normal.  Neck: Neck supple. No thyromegaly present.  Cardiovascular: Normal rate, regular rhythm and normal heart sounds.   Pulmonary/Chest: Effort normal and breath sounds normal.  Lymphadenopathy:    He has no cervical adenopathy.  Neurological: He is alert and oriented to person, place, and time.  Skin: Skin is warm and dry.  Psychiatric: He has a normal mood and affect. His behavior is normal.          Assessment & Plan:  HTN - Well controlled. Continue current regimen. Follow up in  6 months.   Hyperlipidemia - due for labs next months. Slit printed.  Sinus congestion-either viral or allergic. Okay to continue with Zyrtec. Call if not better in one week.

## 2017-06-25 ENCOUNTER — Other Ambulatory Visit: Payer: Self-pay | Admitting: Family Medicine

## 2017-06-25 DIAGNOSIS — K219 Gastro-esophageal reflux disease without esophagitis: Secondary | ICD-10-CM

## 2017-09-17 ENCOUNTER — Other Ambulatory Visit: Payer: Self-pay | Admitting: *Deleted

## 2017-09-17 DIAGNOSIS — I10 Essential (primary) hypertension: Secondary | ICD-10-CM

## 2017-09-17 MED ORDER — LISINOPRIL 10 MG PO TABS: 10.0000 mg | ORAL_TABLET | Freq: Every day | ORAL | 1 refills | Status: DC

## 2017-10-20 ENCOUNTER — Ambulatory Visit (INDEPENDENT_AMBULATORY_CARE_PROVIDER_SITE_OTHER): Payer: Medicare Other | Admitting: Family Medicine

## 2017-10-20 ENCOUNTER — Encounter: Payer: Self-pay | Admitting: Family Medicine

## 2017-10-20 VITALS — BP 124/77 | HR 94 | Ht 67.0 in | Wt 219.0 lb

## 2017-10-20 DIAGNOSIS — Z125 Encounter for screening for malignant neoplasm of prostate: Secondary | ICD-10-CM | POA: Diagnosis not present

## 2017-10-20 DIAGNOSIS — I10 Essential (primary) hypertension: Secondary | ICD-10-CM

## 2017-10-20 NOTE — Progress Notes (Signed)
Subjective:    CC: BP  HPI:  Hypertension- Pt denies chest pain, SOB, dizziness, or heart palpitations.  Taking meds as directed w/o problems.  Denies medication side effects.  Forgot to go for labs last time.    Also c/o of bilateral knee pain.  Says it alternates.  Says it will feel ike a dull ache. Worse in cold weather.   Past medical history, Surgical history, Family history not pertinant except as noted below, Social history, Allergies, and medications have been entered into the medical record, reviewed, and corrections made.   Review of Systems: No fevers, chills, night sweats, weight loss, chest pain, or shortness of breath.   Objective:    General: Well Developed, well nourished, and in no acute distress.  Neuro: Alert and oriented x3, extra-ocular muscles intact, sensation grossly intact.  HEENT: Normocephalic, atraumatic  Skin: Warm and dry, no rashes. Cardiac: Regular rate and rhythm, no murmurs rubs or gallops, no lower extremity edema.  Respiratory: Clear to auscultation bilaterally. Not using accessory muscles, speaking in full sentences.   Impression and Recommendations:    HTN - Well controlled. Continue current regimen. Follow up in  6 months.    Bilat knee pain - certainly could get in with one of our sports med docs if his pain is persistant.

## 2017-10-21 LAB — COMPLETE METABOLIC PANEL WITH GFR
AG RATIO: 1.6 (calc) (ref 1.0–2.5)
ALT: 11 U/L (ref 9–46)
AST: 15 U/L (ref 10–35)
Albumin: 4.4 g/dL (ref 3.6–5.1)
Alkaline phosphatase (APISO): 78 U/L (ref 40–115)
BILIRUBIN TOTAL: 0.4 mg/dL (ref 0.2–1.2)
BUN/Creatinine Ratio: 12 (calc) (ref 6–22)
BUN: 17 mg/dL (ref 7–25)
CHLORIDE: 106 mmol/L (ref 98–110)
CO2: 27 mmol/L (ref 20–32)
Calcium: 9.7 mg/dL (ref 8.6–10.3)
Creat: 1.45 mg/dL — ABNORMAL HIGH (ref 0.70–1.25)
GFR, Est African American: 57 mL/min/{1.73_m2} — ABNORMAL LOW (ref >=60)
GFR, Est Non African American: 49 mL/min/{1.73_m2} — ABNORMAL LOW (ref >=60)
GLUCOSE: 95 mg/dL (ref 65–99)
Globulin: 2.8 g/dL (calc) (ref 1.9–3.7)
POTASSIUM: 4.5 mmol/L (ref 3.5–5.3)
Sodium: 140 mmol/L (ref 135–146)
TOTAL PROTEIN: 7.2 g/dL (ref 6.1–8.1)

## 2017-10-21 LAB — LIPID PANEL
Cholesterol: 186 mg/dL (ref <200)
HDL: 41 mg/dL (ref >40)
LDL Cholesterol (Calc): 127 mg/dL (calc) — ABNORMAL HIGH
NON-HDL CHOLESTEROL (CALC): 145 mg/dL — AB (ref <130)
TRIGLYCERIDES: 82 mg/dL (ref <150)
Total CHOL/HDL Ratio: 4.5 (calc) (ref <5.0)

## 2017-10-21 LAB — PSA: PSA: 2.4 ng/mL (ref <=4.0)

## 2018-02-12 ENCOUNTER — Other Ambulatory Visit: Payer: Self-pay | Admitting: Family Medicine

## 2018-02-12 DIAGNOSIS — I10 Essential (primary) hypertension: Secondary | ICD-10-CM

## 2018-04-06 ENCOUNTER — Ambulatory Visit: Payer: Medicare Other | Admitting: Family Medicine

## 2018-06-04 ENCOUNTER — Ambulatory Visit (INDEPENDENT_AMBULATORY_CARE_PROVIDER_SITE_OTHER): Payer: Medicare Other | Admitting: Family Medicine

## 2018-06-04 ENCOUNTER — Encounter: Payer: Self-pay | Admitting: Family Medicine

## 2018-06-04 VITALS — BP 136/83 | HR 93 | Ht 66.24 in | Wt 228.0 lb

## 2018-06-04 DIAGNOSIS — G43909 Migraine, unspecified, not intractable, without status migrainosus: Secondary | ICD-10-CM | POA: Diagnosis not present

## 2018-06-04 DIAGNOSIS — K219 Gastro-esophageal reflux disease without esophagitis: Secondary | ICD-10-CM

## 2018-06-04 DIAGNOSIS — I10 Essential (primary) hypertension: Secondary | ICD-10-CM

## 2018-06-04 LAB — BASIC METABOLIC PANEL WITH GFR
BUN / CREAT RATIO: 14 (calc) (ref 6–22)
BUN: 19 mg/dL (ref 7–25)
CO2: 24 mmol/L (ref 20–32)
CREATININE: 1.35 mg/dL — AB (ref 0.70–1.25)
Calcium: 9.7 mg/dL (ref 8.6–10.3)
Chloride: 107 mmol/L (ref 98–110)
GFR, EST AFRICAN AMERICAN: 62 mL/min/{1.73_m2} (ref >=60)
GFR, EST NON AFRICAN AMERICAN: 53 mL/min/{1.73_m2} — AB (ref >=60)
Glucose, Bld: 95 mg/dL (ref 65–99)
POTASSIUM: 4.3 mmol/L (ref 3.5–5.3)
SODIUM: 140 mmol/L (ref 135–146)

## 2018-06-04 MED ORDER — AMBULATORY NON FORMULARY MEDICATION: 0 refills | Status: DC

## 2018-06-04 MED ORDER — OMEPRAZOLE 40 MG PO CPDR: 40.0000 mg | DELAYED_RELEASE_CAPSULE | Freq: Every day | ORAL | 3 refills | Status: DC

## 2018-06-04 MED ORDER — IBUPROFEN 800 MG PO TABS: ORAL_TABLET | ORAL | 3 refills | Status: DC

## 2018-06-04 NOTE — Progress Notes (Signed)
Subjective:    Patient ID: Jose Waller, male    DOB: 1950-10-11, 67 y.o.   MRN: [id number]  HPI  Hypertension- Pt denies chest pain, SOB, dizziness, or heart palpitations.  Taking meds as directed w/o problems.  Denies medication side effects.    Migraine headaches-overall he is doing really well.  Once in a while he will get a headache triggered by certain smells etc.  He says more recently he went out to eat and thinks that he ate some MSG and that is usually a big trigger for him as well.  He says usually if he can take his ibuprofen and put a hot towel on his head and rest he can usually get it under control.  If not then it usually winded lasting 2 to 3 days.  GERD/reflux-he just uses his omeprazole as needed.  He tries not to take it every day.   Review of Systems  BP 136/83   Pulse 93   Ht 5' 6.24" (1.682 m)   Wt 228 lb (103.4 kg)   SpO2 99%   BMI 36.53 kg/m     Allergies  Allergen Reactions  . Prednisone     REACTION: hick ups    Past Medical History:  Diagnosis Date  . Cluster headache     Past Surgical History:  Procedure Laterality Date  . back lumbar  2004   fusion     Social History   Socioeconomic History  . Marital status: Widowed    Spouse name: Not on file  . Number of children: 2  . Years of education: Not on file  . Highest education level: Not on file  Occupational History  . Not on file  Social Needs  . Financial resource strain: Not on file  . Food insecurity:    Worry: Not on file    Inability: Not on file  . Transportation needs:    Medical: Not on file    Non-medical: Not on file  Tobacco Use  . Smoking status: Former Smoker    Last attempt to quit: 07/29/2001    Years since quitting: 16.8  . Smokeless tobacco: Never Used  Substance and Sexual Activity  . Alcohol use: No  . Drug use: Not on file    Comment: Former cocaine user, quit 1998  . Sexual activity: Not on file    Comment: married, 2 kids, on disability, regularly  exercises.  Lifestyle  . Physical activity:    Days per week: Not on file    Minutes per session: Not on file  . Stress: Not on file  Relationships  . Social connections:    Talks on phone: Not on file    Gets together: Not on file    Attends religious service: Not on file    Active member of club or organization: Not on file    Attends meetings of clubs or organizations: Not on file    Relationship status: Not on file  . Intimate partner violence:    Fear of current or ex partner: Not on file    Emotionally abused: Not on file    Physically abused: Not on file    Forced sexual activity: Not on file  Other Topics Concern  . Not on file  Social History Narrative   On disability.  Some exercise. Wife passed away in a motor vehicle accident. He is raising 2 daughters by himself.    Family History  Problem Relation Age of Onset  .  Lung cancer Father        lung/ was a Hydrologist  . Alcohol abuse Other   . Hypertension Brother   . Dementia Mother   . Coronary artery disease Brother     Outpatient Encounter Medications as of 06/04/2018  Medication Sig  . ibuprofen (ADVIL,MOTRIN) 800 MG tablet TAKE 1 TABLET BY MOUTH EVERY 8 HOURS WITH FOOD AS NEEDED  . lisinopril (PRINIVIL,ZESTRIL) 10 MG tablet TAKE 1 TABLET BY MOUTH ONCE DAILY  . omeprazole (PRILOSEC) 40 MG capsule Take 1 capsule (40 mg total) by mouth daily.  . [DISCONTINUED] ibuprofen (ADVIL,MOTRIN) 800 MG tablet TAKE 1 TABLET BY MOUTH EVERY 8 HOURS WITH FOOD AS NEEDED  . [DISCONTINUED] omeprazole (PRILOSEC) 40 MG capsule TAKE 1 CAPSULE BY MOUTH ONCE DAILY  . AMBULATORY NON FORMULARY MEDICATION Medication Name: Tdap  1x IM   No facility-administered encounter medications on file as of 06/04/2018.          Objective:   Physical Exam  Constitutional: He is oriented to person, place, and time. He appears well-developed and well-nourished.  HENT:  Head: Normocephalic and atraumatic.  Cardiovascular: Normal rate, regular rhythm  and normal heart sounds.  Pulmonary/Chest: Effort normal and breath sounds normal.  Neurological: He is alert and oriented to person, place, and time.  Skin: Skin is warm and dry.  Psychiatric: He has a normal mood and affect. His behavior is normal.        Assessment & Plan:  HTN - Well controlled. Continue current regimen. Follow up in  6 months.    GERD - Uses PRN.    Migraine HA - Uses IBU prn.

## 2018-06-05 ENCOUNTER — Encounter: Payer: Self-pay | Admitting: Family Medicine

## 2018-06-05 DIAGNOSIS — N1831 Chronic kidney disease, stage 3a: Secondary | ICD-10-CM

## 2018-06-05 DIAGNOSIS — N183 Chronic kidney disease, stage 3 unspecified: Secondary | ICD-10-CM | POA: Insufficient documentation

## 2018-06-05 HISTORY — DX: Chronic kidney disease, stage 3a: N18.31

## 2018-06-05 HISTORY — DX: Chronic kidney disease, stage 3 unspecified: N18.30

## 2018-08-12 NOTE — Progress Notes (Deleted)
Subjective:   Brayton LaymanJimmy M Stumph is a 68 y.o. male who presents for Medicare Annual (Subsequent) preventive examination.  Review of Systems:  No ROS.  Medicare Wellness Visit. Additional risk factors are reflected in the social history.     Sleep patterns:   Home Safety/Smoke Alarms: Feels safe in home. Smoke alarms in place.  Living environment;  Seat Belt Safety/Bike Helmet: Wears seat belt.  Male:   CCS- utd    PSA- utd Lab Results  Component Value Date   PSA 2.4 10/20/2017   PSA 1.5 05/15/2016   PSA 1.57 04/19/2015        Objective:     Vitals: There were no vitals taken for this visit.  There is no height or weight on file to calculate BMI.  Advanced Directives 04/19/2015  Does Patient Have a Medical Advance Directive? No  Would patient like information on creating a medical advance directive? Yes - Transport plannerducational materials given    Tobacco Social History   Tobacco Use  Smoking Status Former Smoker  . Last attempt to quit: 07/29/2001  . Years since quitting: 17.0  Smokeless Tobacco Never Used     Counseling given: Not Answered   Clinical Intake:                       Past Medical History:  Diagnosis Date  . Cluster headache    Past Surgical History:  Procedure Laterality Date  . back lumbar  2004   fusion    Family History  Problem Relation Age of Onset  . Lung cancer Father        lung/ was a Hydrologistminer  . Alcohol abuse Other   . Hypertension Brother   . Dementia Mother   . Coronary artery disease Brother    Social History   Socioeconomic History  . Marital status: Widowed    Spouse name: Not on file  . Number of children: 2  . Years of education: Not on file  . Highest education level: Not on file  Occupational History  . Not on file  Social Needs  . Financial resource strain: Not on file  . Food insecurity:    Worry: Not on file    Inability: Not on file  . Transportation needs:    Medical: Not on file    Non-medical: Not  on file  Tobacco Use  . Smoking status: Former Smoker    Last attempt to quit: 07/29/2001    Years since quitting: 17.0  . Smokeless tobacco: Never Used  Substance and Sexual Activity  . Alcohol use: No  . Drug use: Not on file    Comment: Former cocaine user, quit 1998  . Sexual activity: Not on file    Comment: married, 2 kids, on disability, regularly exercises.  Lifestyle  . Physical activity:    Days per week: Not on file    Minutes per session: Not on file  . Stress: Not on file  Relationships  . Social connections:    Talks on phone: Not on file    Gets together: Not on file    Attends religious service: Not on file    Active member of club or organization: Not on file    Attends meetings of clubs or organizations: Not on file    Relationship status: Not on file  Other Topics Concern  . Not on file  Social History Narrative   On disability.  Some exercise. Wife passed away  in a motor vehicle accident. He is raising 2 daughters by himself.    Outpatient Encounter Medications as of 08/19/2018  Medication Sig  . AMBULATORY NON FORMULARY MEDICATION Medication Name: Tdap  1x IM  . ibuprofen (ADVIL,MOTRIN) 800 MG tablet TAKE 1 TABLET BY MOUTH EVERY 8 HOURS WITH FOOD AS NEEDED  . lisinopril (PRINIVIL,ZESTRIL) 10 MG tablet TAKE 1 TABLET BY MOUTH ONCE DAILY  . omeprazole (PRILOSEC) 40 MG capsule Take 1 capsule (40 mg total) by mouth daily.   No facility-administered encounter medications on file as of 08/19/2018.     Activities of Daily Living No flowsheet data found.  Patient Care Team: Agapito Games, MD as PCP - General    Assessment:   This is a routine wellness examination for Alexus.Physical assessment deferred to PCP.   Exercise Activities and Dietary recommendations   Diet  Breakfast: Lunch:  Dinner:       Goals   None     Fall Risk Fall Risk  06/04/2018 10/20/2017 10/16/2016 05/08/2016  Falls in the past year? 0 No No No   Is the patient's home  free of loose throw rugs in walkways, pet beds, electrical cords, etc?   {Blank single:19197::"yes","no"}      Grab bars in the bathroom? {Blank single:19197::"yes","no"}      Handrails on the stairs?   {Blank single:19197::"yes","no"}      Adequate lighting?   {Blank single:19197::"yes","no"}  Depression Screen PHQ 2/9 Scores 06/04/2018 10/20/2017 04/22/2017 10/16/2016  PHQ - 2 Score 0 0 0 0     Cognitive Function        Immunization History  Administered Date(s) Administered  . Td 05/02/2008  . Zoster 02/12/2011    Screening Tests Health Maintenance  Topic Date Due  . INFLUENZA VACCINE  07/29/2019 (Originally 02/26/2018)  . PNA vac Low Risk Adult (1 of 2 - PCV13) 07/29/2019 (Originally 12/13/2013)  . TETANUS/TDAP  07/28/2020 (Originally 05/02/2018)  . COLONOSCOPY  06/10/2019  . Hepatitis C Screening  Completed      Plan:   ***   I have personally reviewed and noted the following in the patient's chart:   . Medical and social history . Use of alcohol, tobacco or illicit drugs  . Current medications and supplements . Functional ability and status . Nutritional status . Physical activity . Advanced directives . List of other physicians . Hospitalizations, surgeries, and ER visits in previous 12 months . Vitals . Screenings to include cognitive, depression, and falls . Referrals and appointments  In addition, I have reviewed and discussed with patient certain preventive protocols, quality metrics, and best practice recommendations. A written personalized care plan for preventive services as well as general preventive health recommendations were provided to patient.     Normand Sloop, LPN  1/61/0960

## 2018-08-19 ENCOUNTER — Ambulatory Visit: Payer: Medicare Other

## 2018-08-21 ENCOUNTER — Other Ambulatory Visit: Payer: Self-pay | Admitting: Family Medicine

## 2018-08-21 DIAGNOSIS — I10 Essential (primary) hypertension: Secondary | ICD-10-CM

## 2018-08-24 ENCOUNTER — Encounter: Payer: Self-pay | Admitting: Family Medicine

## 2018-08-24 ENCOUNTER — Ambulatory Visit (INDEPENDENT_AMBULATORY_CARE_PROVIDER_SITE_OTHER): Payer: Medicare Other | Admitting: Family Medicine

## 2018-08-24 ENCOUNTER — Ambulatory Visit (INDEPENDENT_AMBULATORY_CARE_PROVIDER_SITE_OTHER): Payer: Medicare Other | Admitting: *Deleted

## 2018-08-24 VITALS — BP 130/90 | HR 60 | Ht 66.0 in | Wt 231.0 lb

## 2018-08-24 DIAGNOSIS — Z0000 Encounter for general adult medical examination without abnormal findings: Secondary | ICD-10-CM

## 2018-08-24 DIAGNOSIS — N529 Male erectile dysfunction, unspecified: Secondary | ICD-10-CM | POA: Diagnosis not present

## 2018-08-24 DIAGNOSIS — M2242 Chondromalacia patellae, left knee: Secondary | ICD-10-CM | POA: Diagnosis not present

## 2018-08-24 MED ORDER — SILDENAFIL CITRATE 20 MG PO TABS: 40.0000 mg | ORAL_TABLET | Freq: Every day | ORAL | 0 refills | Status: DC | PRN

## 2018-08-24 NOTE — Progress Notes (Signed)
Acute Office Visit  Subjective:    Patient ID: Jose LaymanJimmy M Waller, male    DOB: 21-Aug-1950, 68 y.o.   MRN: [id number]  Chief Complaint  Patient presents with  . Knee Pain    HPI Patient is in today for left knee pain.  He denies any injury or trauma.  No swelling.  He says he notices it more when he is been sitting for long period of time such as at a movie and then tries to get up he will have pain in that left knee.  No giving out popping or locking.  He denies any known injury or trauma to that knee.  He says sometimes once he gets up and feel sore the more he walks the better and actually feels.  He describes the pain is all over the knee and not just located in one area.  He will occ apply heat. He hasn't notice pain with stair but says he really avoids stairs.    Also wanted to discuss erectile dysfunction.  Since his wife's death he has started dating again.  In the past he has taken Viagra but says it caused nasal congestion and he tried Cialis but it gave him a severe headache he wants to know what his other options are.  He would like to try something that is not expensive.  Past Medical History:  Diagnosis Date  . Cluster headache     Past Surgical History:  Procedure Laterality Date  . back lumbar  2004   fusion     Family History  Problem Relation Age of Onset  . Lung cancer Father        lung/ was a Hydrologistminer  . Alcohol abuse Other   . Hypertension Brother   . Dementia Mother   . Coronary artery disease Brother     Social History   Socioeconomic History  . Marital status: Widowed    Spouse name: Not on file  . Number of children: 2  . Years of education: 4711  . Highest education level: 11th grade  Occupational History  . Occupation: retired    Comment: Technical sales engineerpest control technician  Social Needs  . Financial resource strain: Not hard at all  . Food insecurity:    Worry: Never true    Inability: Never true  . Transportation needs:    Medical: No    Non-medical:  No  Tobacco Use  . Smoking status: Former Smoker    Last attempt to quit: 07/29/2001    Years since quitting: 17.0  . Smokeless tobacco: Never Used  Substance and Sexual Activity  . Alcohol use: No  . Drug use: Not Currently    Comment: Former cocaine user, quit 1998  . Sexual activity: Yes    Comment: married, 2 kids, on disability, regularly exercises.  Lifestyle  . Physical activity:    Days per week: 0 days    Minutes per session: 0 min  . Stress: Not at all  Relationships  . Social connections:    Talks on phone: More than three times a week    Gets together: Never    Attends religious service: More than 4 times per year    Active member of club or organization: No    Attends meetings of clubs or organizations: Never    Relationship status: Widowed  . Intimate partner violence:    Fear of current or ex partner: No    Emotionally abused: No    Physically abused: No  Forced sexual activity: No  Other Topics Concern  . Not on file  Social History Narrative   On disability.  Some exercise. Wife passed away in a motor vehicle accident. He is raising 2 daughters by himself. Former drug addict. Likes to stay busy. 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 ibuprofen (ADVIL,MOTRIN) 800 MG tablet TAKE 1 TABLET BY MOUTH EVERY 8 HOURS WITH FOOD AS NEEDED 90 tablet 3  . lisinopril (PRINIVIL,ZESTRIL) 10 MG tablet TAKE 1 TABLET BY MOUTH ONCE DAILY 90 tablet 3  . omeprazole (PRILOSEC) 40 MG capsule Take 1 capsule (40 mg total) by mouth daily. 90 capsule 3  . AMBULATORY NON FORMULARY MEDICATION Medication Name: Tdap  1x IM (Patient not taking: Reported on 08/24/2018) 1 vial 0   No facility-administered medications prior to visit.     Allergies  Allergen Reactions  . Prednisone     REACTION: hick ups    ROS     Objective:    Physical Exam  Constitutional: He is oriented to person, place, and  time. He appears well-developed and well-nourished.  HENT:  Head: Normocephalic and atraumatic.  Eyes: Conjunctivae and EOM are normal.  Cardiovascular: Normal rate.  Pulmonary/Chest: Effort normal.  Musculoskeletal:     Comments: Left knee is nontender on exam.  Normal flexion and extension.  No significant crepitus.  Normal anterior drawer test.  Negative McMurray's.  Neurological: He is alert and oriented to person, place, and time.  Skin: Skin is dry. No pallor.  Psychiatric: He has a normal mood and affect. His behavior is normal.  Vitals reviewed.   BP 130/90   Pulse 60   Ht 5\' 6"  (1.676 m)   Wt 231 lb (104.8 kg)   SpO2 99%   BMI 37.28 kg/m  Wt Readings from Last 3 Encounters:  08/24/18 231 lb (104.8 kg)  08/24/18 231 lb (104.8 kg)  06/04/18 228 lb (103.4 kg)    There are no preventive care reminders to display for this patient.  There are no preventive care reminders to display for this patient.   Lab Results  Component Value Date   TSH 0.776 05/16/2008   No results found for: WBC, HGB, HCT, MCV, PLT Lab Results  Component Value Date   NA 140 06/04/2018   K 4.3 06/04/2018   CO2 24 06/04/2018   GLUCOSE 95 06/04/2018   BUN 19 06/04/2018   CREATININE 1.35 (H) 06/04/2018   BILITOT 0.4 10/20/2017   ALKPHOS 71 05/15/2016   AST 15 10/20/2017   ALT 11 10/20/2017   PROT 7.2 10/20/2017   ALBUMIN 4.1 05/15/2016   CALCIUM 9.7 06/04/2018   Lab Results  Component Value Date   CHOL 186 10/20/2017   Lab Results  Component Value Date   HDL 41 10/20/2017   Lab Results  Component Value Date   LDLCALC 127 (H) 10/20/2017   Lab Results  Component Value Date   TRIG 82 10/20/2017   Lab Results  Component Value Date   CHOLHDL 4.5 10/20/2017   No results found for: HGBA1C     Assessment & Plan:   Problem List Items Addressed This Visit    None    Visit Diagnoses    Chondromalacia of left patella    -  Primary   Erectile dysfunction, unspecified erectile  dysfunction type          Chondromalacia of left patella-given exercises for  patellofemoral syndrome to do on his own at home.  If not improving then please let us know and can always consider x-ray for further work-up though I suspect he probably has some mild arthritis in that knee at the age of 67. Recommend NSAID and Ice prn.    Erectile dysfunction-discussed options.  We could certainly try a low-dose of sildenafil which would also be cost savings compared to some of the other options on the market.  He can see if a low dose is effective without causing significant nasal congestion.  I explained that because it does dilate the blood vessels that it is not uncommon to cause nasal congestion.  We could also consider a trial of Levitra if the low-dose sildenafil is not effective or causes side effects.   Meds ordered this encounter  Medications  . sildenafil (REVATIO) 20 MG tablet    Sig: Take 2-5 tablets (40-100 mg total) by mouth daily as needed.    Dispense:  20 tablet    Refill:  0     Nani Gasser, MD

## 2018-08-24 NOTE — Patient Instructions (Signed)
Mr. Koltun , Thank you for taking time to come for your Medicare Wellness Visit. I appreciate your ongoing commitment to your health goals. Please review the following plan we discussed and let me know if I can assist you in the future.  Please schedule your next medicare wellness visit with me in 1 yr. Bring a copy of your living will and/or healthcare power of attorney to your next office visit. These are the goals we discussed: Goals    . Exercise 3x per week (30 min per time)     Try to at least exercise 3 times a week for at least 30 minutes a day

## 2018-08-24 NOTE — Progress Notes (Signed)
Subjective:   Jose Waller is a 68 y.o. male who presents for Medicare Annual/Subsequent preventive examination. Additional risk factors are reflected in the social history.   Cardiac Risk Factors include: advanced age (>13men, >73 women);hypertension;dyslipidemia  Sleep patterns: Getting 11 hours a night.Wakes up during the night 2-3 times to go to the bathroom   Home Safety/Smoke Alarms: Feels safe in home. Smoke alarms in place.  Living environment;Lives alone in 2 story home and handrails are in place on the stairs. Walk in shower with grab bars in place Seat Belt Safety/Bike Helmet: Wears seat belt.     Male:   CCS- utd    PSA- utd Lab Results  Component Value Date   PSA 2.4 10/20/2017   PSA 1.5 05/15/2016   PSA 1.57 04/19/2015        Objective:    Vitals: BP 130/90 (BP Location: Left Arm, Patient Position: Sitting, Cuff Size: Large)   Pulse 60   Ht 5\' 6"  (1.676 m)   Wt 231 lb (104.8 kg)   SpO2 97%   BMI 37.28 kg/m   Body mass index is 37.28 kg/m.  Advanced Directives 08/24/2018 04/19/2015  Does Patient Have a Medical Advance Directive? No No  Would patient like information on creating a medical advance directive? Yes (MAU/Ambulatory/Procedural Areas - Information given) Yes - Educational materials given    Tobacco Social History   Tobacco Use  Smoking Status Former Smoker  . Last attempt to quit: 07/29/2001  . Years since quitting: 17.0  Smokeless Tobacco Never Used     Counseling given: Not Answered   Clinical Intake:  Pre-visit preparation completed: Yes  Pain : No/denies pain     Nutritional Risks: None Diabetes: No  How often do you need to have someone help you when you read instructions, pamphlets, or other written materials from your doctor or pharmacy?: 1 - Never What is the last grade level you completed in school?: 11  Interpreter Needed?: No  Information entered by :: Alfred Levins, LPN  Past Medical History:  Diagnosis Date  .  Cluster headache    Past Surgical History:  Procedure Laterality Date  . back lumbar  2004   fusion    Family History  Problem Relation Age of Onset  . Lung cancer Father        lung/ was a Hydrologist  . Alcohol abuse Other   . Hypertension Brother   . Dementia Mother   . Coronary artery disease Brother    Social History   Socioeconomic History  . Marital status: Widowed    Spouse name: Not on file  . Number of children: 2  . Years of education: 63  . Highest education level: 11th grade  Occupational History  . Occupation: retired    Comment: Technical sales engineer  Social Needs  . Financial resource strain: Not hard at all  . Food insecurity:    Worry: Never true    Inability: Never true  . Transportation needs:    Medical: No    Non-medical: No  Tobacco Use  . Smoking status: Former Smoker    Last attempt to quit: 07/29/2001    Years since quitting: 17.0  . Smokeless tobacco: Never Used  Substance and Sexual Activity  . Alcohol use: No  . Drug use: Not Currently    Comment: Former cocaine user, quit 1998  . Sexual activity: Yes    Comment: married, 2 kids, on disability, regularly exercises.  Lifestyle  .  Physical activity:    Days per week: 0 days    Minutes per session: 0 min  . Stress: Not at all  Relationships  . Social connections:    Talks on phone: More than three times a week    Gets together: Never    Attends religious service: More than 4 times per year    Active member of club or organization: No    Attends meetings of clubs or organizations: Never    Relationship status: Widowed  Other Topics Concern  . Not on file  Social History Narrative   On disability.  Some exercise. Wife passed away in a motor vehicle accident. He is raising 2 daughters by himself. Former drug addict. Likes to stay busy. 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 ibuprofen (ADVIL,MOTRIN) 800 MG  tablet TAKE 1 TABLET BY MOUTH EVERY 8 HOURS WITH FOOD AS NEEDED  . lisinopril (PRINIVIL,ZESTRIL) 10 MG tablet TAKE 1 TABLET BY MOUTH ONCE DAILY  . omeprazole (PRILOSEC) 40 MG capsule Take 1 capsule (40 mg total) by mouth daily.  . AMBULATORY NON FORMULARY MEDICATION Medication Name: Tdap  1x IM (Patient not taking: Reported on 08/24/2018)  . [DISCONTINUED] lisinopril (PRINIVIL,ZESTRIL) 10 MG tablet TAKE 1 TABLET BY MOUTH ONCE DAILY   No facility-administered encounter medications on file as of 08/24/2018.     Activities of Daily Living In your present state of health, do you have any difficulty performing the following activities: 08/24/2018  Hearing? N  Vision? N  Difficulty concentrating or making decisions? N  Walking or climbing stairs? N  Dressing or bathing? N  Doing errands, shopping? N  Preparing Food and eating ? N  Using the Toilet? N  In the past six months, have you accidently leaked urine? N  Comment has noticed that when he has to go he needs to go right then  Do you have problems with loss of bowel control? N  Managing your Medications? N  Managing your Finances? N  Housekeeping or managing your Housekeeping? N  Some recent data might be hidden    Patient Care Team: Agapito GamesMetheney, Catherine D, MD as PCP - General   Assessment:   This is a routine wellness examination for Jose Waller.Physical assessment deferred to PCP.    Exercise Activities and Dietary recommendations Current Exercise Habits: The patient does not participate in regular exercise at present, Exercise limited by: None identified Diet Patient states eats out a lot because its just him Breakfast:sausage biscuit and coffee Lunch: meat and vegetables from [employer] Dinner: Meat and a vegetable        Goals    . Exercise 3x per week (30 min per time)     Try to at least exercise 3 times a week for at least 30 minutes a day       Fall Risk Fall Risk  08/24/2018 06/04/2018 10/20/2017 10/16/2016  05/08/2016  Falls in the past year? 0 0 No No No  Follow up Falls prevention discussed - - - -   Is the patient's home free of loose throw rugs in walkways, pet beds, electrical cords, etc?   yes      Grab bars in the bathroom? yes      Handrails on the stairs?   yes      Adequate lighting?   yes    Depression Screen PHQ 2/9 Scores 08/24/2018 06/04/2018 10/20/2017 04/22/2017  PHQ -  2 Score 0 0 0 0    Cognitive Function     6CIT Screen 08/24/2018  What Year? 0 points  What month? 0 points  What time? 0 points  Count back from 20 0 points  Months in reverse 0 points  Repeat phrase 0 points  Total Score 0    Immunization History  Administered Date(s) Administered  . Td 05/02/2008  . Zoster 02/12/2011      Screening Tests Health Maintenance  Topic Date Due  . INFLUENZA VACCINE  07/29/2019 (Originally 02/26/2018)  . PNA vac Low Risk Adult (1 of 2 - PCV13) 07/29/2019 (Originally 12/13/2013)  . TETANUS/TDAP  07/28/2020 (Originally 05/02/2018)  . COLONOSCOPY  06/10/2019  . Hepatitis C Screening  Completed        Plan:      Jose Waller , Thank you for taking time to come for your Medicare Wellness Visit. I appreciate your ongoing commitment to your health goals. Please review the following plan we discussed and let me know if I can assist you in the future.  Please schedule your next medicare wellness visit with me in 1 yr. Bring a copy of your living will and/or healthcare power of attorney to your next office visit.    These are the goals we discussed: Goals    . Exercise 3x per week (30 min per time)     Try to at least exercise 3 times a week for at least 30 minutes a day       This is a list of the screening recommended for you and due dates:  Health Maintenance  Topic Date Due  . Flu Shot  07/29/2019*  . Pneumonia vaccines (1 of 2 - PCV13) 07/29/2019*  . Tetanus Vaccine  07/28/2020*  . Colon Cancer Screening  06/10/2019  .  Hepatitis C: One time screening is  recommended by Center for Disease Control  (CDC) for  adults born from [birth year] through 1967.   Completed  *Topic was postponed. The date shown is not the original due date.     I have personally reviewed and noted the following in the patient's chart:   . Medical and social history . Use of alcohol, tobacco or illicit drugs  . Current medications and supplements . Functional ability and status . Nutritional status . Physical activity . Advanced directives . List of other physicians . Hospitalizations, surgeries, and ER visits in previous 12 months . Vitals . Screenings to include cognitive, depression, and falls . Referrals and appointments  In addition, I have reviewed and discussed with patient certain preventive protocols, quality metrics, and best practice recommendations. A written personalized care plan for preventive services as well as general preventive health recommendations were provided to patient.     Normand Sloop, LPN  11/30/6977

## 2018-12-02 NOTE — Progress Notes (Signed)
Virtual Visit via Video Note  I connected with Jose Waller on 12/03/18 at  8:30 AM EDT by a video enabled telemedicine application and verified that I am speaking with the correct person using two identifiers.   I discussed the limitations of evaluation and management by telemedicine and the availability of in person appointments. The patient expressed understanding and agreed to proceed.  Pt was at home and I was in my office for the virtual visit.     Subjective:    CC: F/u BP  HPI: Hypertension- Pt denies chest pain, SOB, dizziness, or heart palpitations.  Taking meds as directed w/o problems.  Denies medication side effects.     F/U CKD - no changes to urination. He is doing well.   Left knee pain - I had evluated his knee in Jan and diagnosed him with chondromalacia patella.  Worse after prolonged sitting.  It is better but still not resolved. He is only 60% better. Still doing his exercises. He owuld like to consider an xray at this point if gets worse.   Past medical history, Surgical history, Family history not pertinant except as noted below, Social history, Allergies, and medications have been entered into the medical record, reviewed, and corrections made.   Review of Systems: No fevers, chills, night sweats, weight loss, chest pain, or shortness of breath.   Objective:    General: Speaking clearly in complete sentences without any shortness of breath.  Alert and oriented x3.  Normal judgment. No apparent acute distress. WEll groomed.    Impression and Recommendations:    HTN - Asymptomatic. Unable to check BP at home. Will f/U in 6 mo.  Due for labs.   CKD 3 - due to recheck renal function Q 6 mo.   Left knee pain - consider xrays. Order placed. Hecan go anytime.   Hyperlpidemia - Due to recheck lipids.    Due for prostate cancer screening. Ordered PSA.      I discussed the assessment and treatment plan with the patient. The patient was provided an  opportunity to ask questions and all were answered. The patient agreed with the plan and demonstrated an understanding of the instructions.   The patient was advised to call back or seek an in-person evaluation if the symptoms worsen or if the condition fails to improve as anticipated.   Nani Gasser, MD

## 2018-12-03 ENCOUNTER — Ambulatory Visit (INDEPENDENT_AMBULATORY_CARE_PROVIDER_SITE_OTHER): Payer: Medicare Other | Admitting: Family Medicine

## 2018-12-03 ENCOUNTER — Encounter: Payer: Self-pay | Admitting: Family Medicine

## 2018-12-03 VITALS — Ht 66.0 in

## 2018-12-03 DIAGNOSIS — M25562 Pain in left knee: Secondary | ICD-10-CM

## 2018-12-03 DIAGNOSIS — N183 Chronic kidney disease, stage 3 unspecified: Secondary | ICD-10-CM

## 2018-12-03 DIAGNOSIS — I10 Essential (primary) hypertension: Secondary | ICD-10-CM

## 2018-12-03 DIAGNOSIS — Z125 Encounter for screening for malignant neoplasm of prostate: Secondary | ICD-10-CM | POA: Diagnosis not present

## 2018-12-03 DIAGNOSIS — E785 Hyperlipidemia, unspecified: Secondary | ICD-10-CM | POA: Diagnosis not present

## 2018-12-04 ENCOUNTER — Encounter: Payer: Self-pay | Admitting: Family Medicine

## 2018-12-04 LAB — MICROALBUMIN / CREATININE URINE RATIO
Creatinine, Urine: 135 mg/dL (ref 20–320)
Microalb Creat Ratio: 19 mcg/mg creat (ref <30)
Microalb, Ur: 2.5 mg/dL

## 2018-12-04 LAB — COMPLETE METABOLIC PANEL WITH GFR
AG Ratio: 1.6 (calc) (ref 1.0–2.5)
ALT: 12 U/L (ref 9–46)
AST: 15 U/L (ref 10–35)
Albumin: 4.4 g/dL (ref 3.6–5.1)
Alkaline phosphatase (APISO): 68 U/L (ref 35–144)
BUN/Creatinine Ratio: 13 (calc) (ref 6–22)
BUN: 19 mg/dL (ref 7–25)
CO2: 27 mmol/L (ref 20–32)
Calcium: 9.6 mg/dL (ref 8.6–10.3)
Chloride: 104 mmol/L (ref 98–110)
Creat: 1.48 mg/dL — ABNORMAL HIGH (ref 0.70–1.25)
GFR, Est African American: 55 mL/min/{1.73_m2} — ABNORMAL LOW (ref >=60)
GFR, Est Non African American: 48 mL/min/{1.73_m2} — ABNORMAL LOW (ref >=60)
Globulin: 2.8 g/dL (calc) (ref 1.9–3.7)
Glucose, Bld: 102 mg/dL — ABNORMAL HIGH (ref 65–99)
Potassium: 4.6 mmol/L (ref 3.5–5.3)
Sodium: 138 mmol/L (ref 135–146)
Total Bilirubin: 0.4 mg/dL (ref 0.2–1.2)
Total Protein: 7.2 g/dL (ref 6.1–8.1)

## 2018-12-04 LAB — LIPID PANEL
Cholesterol: 194 mg/dL (ref <200)
HDL: 42 mg/dL (ref >=40)
LDL Cholesterol (Calc): 128 mg/dL (calc) — ABNORMAL HIGH
Non-HDL Cholesterol (Calc): 152 mg/dL (calc) — ABNORMAL HIGH (ref <130)
Total CHOL/HDL Ratio: 4.6 (calc) (ref <5.0)
Triglycerides: 125 mg/dL (ref <150)

## 2018-12-04 LAB — PSA: PSA: 2.1 ng/mL (ref <=4.0)

## 2018-12-22 ENCOUNTER — Other Ambulatory Visit: Payer: Self-pay | Admitting: Family Medicine

## 2019-01-27 ENCOUNTER — Emergency Department (INDEPENDENT_AMBULATORY_CARE_PROVIDER_SITE_OTHER): Payer: Medicare Other

## 2019-01-27 ENCOUNTER — Other Ambulatory Visit: Payer: Self-pay

## 2019-01-27 ENCOUNTER — Encounter: Payer: Self-pay | Admitting: Emergency Medicine

## 2019-01-27 ENCOUNTER — Emergency Department (INDEPENDENT_AMBULATORY_CARE_PROVIDER_SITE_OTHER)
Admission: EM | Admit: 2019-01-27 | Discharge: 2019-01-27 | Disposition: A | Discharge status: Home / Self Care | Payer: Medicare Other | Source: Home / Self Care | Attending: Family Medicine | Admitting: Family Medicine

## 2019-01-27 ENCOUNTER — Telehealth: Payer: Self-pay | Admitting: Emergency Medicine

## 2019-01-27 DIAGNOSIS — R0989 Other specified symptoms and signs involving the circulatory and respiratory systems: Secondary | ICD-10-CM | POA: Diagnosis not present

## 2019-01-27 DIAGNOSIS — R531 Weakness: Secondary | ICD-10-CM

## 2019-01-27 DIAGNOSIS — R062 Wheezing: Secondary | ICD-10-CM | POA: Diagnosis not present

## 2019-01-27 DIAGNOSIS — R5383 Other fatigue: Secondary | ICD-10-CM

## 2019-01-27 LAB — POCT CBC W AUTO DIFF (K'VILLE URGENT CARE)

## 2019-01-27 LAB — POCT FASTING CBG KUC MANUAL ENTRY: POCT Glucose (KUC): 113 mg/dL — AB (ref 70–99)

## 2019-01-27 NOTE — ED Provider Notes (Signed)
[facility]    CSN: [id number] Arrival date & time: 01/27/19  1146     History   Chief Complaint Chief Complaint  Patient presents with   Fatigue    HPI Jose Waller is a 68 y.o. male.   HPI Jose Waller is a 68 y.o. male presenting to UC with c/o fatigue and generalized weakness he believes it due to taking OTC extra strength acetaminophen for arthritis for his Left knee.  Pt notes his family was concerned because they felt his speech was slurred this morning but no concern for confusion or atypical behavior.  Pt denies one sided weakness or numbness. Denies chest pain, SOB, fever, chills, vomiting or diarrhea.  He reports being a drug addict in the past so he tries to avoid any medications when possible but his knee was hurting more than usual the other day due to going "antiquiing" the last 4-5 days. Something he use to do weekly prior to Covid-19 shutdowns. This past weekend was the first time he has gone in over 3 months.  He notes he has had similar reactions of severe fatigue and feeling "worn out" after taking OTC Tylenol and Motrin in the past.  He has not taken any medication today. Pt believes his family thought his speech was slurred due to his dentures not fitting properly this morning. Denies HA or dizziness. Denies change in vision. No prior hx of stroke. He takes his medications daily as prescribed. His last f/u with his PCP was a tele-visit on 12/03/2018. He did have labs drawn and was advised everything was looking good at that time. Pt denies concern for heart problems, pneumonia, or Covid.    Past Medical History:  Diagnosis Date   CKD (chronic kidney disease) stage 3, GFR 30-59 ml/min (HCC) 06/05/2018   Cluster headache     Patient Active Problem List   Diagnosis Date Noted   CKD stage G3a/A1, GFR 45-59 and albumin creatinine ratio <30 mg/g (HCC) 06/05/2018   Migraine 09/08/2013   Low back pain 09/08/2013   Hyperlipidemia 12/31/2010   GERD  11/08/2009   HYPERTENSION 04/22/2008    Past Surgical History:  Procedure Laterality Date   back lumbar  2004   fusion        Home Medications    Prior to Admission medications   Medication Sig Start Date End Date Taking? Authorizing Provider  ibuprofen (ADVIL,MOTRIN) 800 MG tablet TAKE 1 TABLET BY MOUTH EVERY 8 HOURS WITH FOOD AS NEEDED 06/04/18   Agapito GamesMetheney, Catherine D, MD  lisinopril (PRINIVIL,ZESTRIL) 10 MG tablet TAKE 1 TABLET BY MOUTH ONCE DAILY 08/24/18   Agapito GamesMetheney, Catherine D, MD  omeprazole (PRILOSEC) 40 MG capsule Take 1 capsule (40 mg total) by mouth daily. 06/04/18   Agapito GamesMetheney, Catherine D, MD  sildenafil (REVATIO) 20 MG tablet TAKE 2-5 TABLETS (40-100 MG TOTAL) BY MOUTH DAILY AS NEEDED. 12/22/18   Agapito GamesMetheney, Catherine D, MD    Family History Family History  Problem Relation Age of Onset   Lung cancer Father        lung/ was a miner   Alcohol abuse Other    Hypertension Brother    Dementia Mother    Coronary artery disease Brother     Social History Social History   Tobacco Use   Smoking status: Former Smoker    Quit date: 07/29/2001    Years since quitting: 17.5   Smokeless tobacco: Never Used  Substance Use Topics   Alcohol use: No  Drug use: Not Currently    Comment: Former cocaine user, quit 1998     Allergies   Prednisone   Review of Systems Review of Systems  Constitutional: Positive for fatigue. Negative for chills and fever.  HENT: Negative for congestion, ear pain, sore throat, trouble swallowing and voice change.   Respiratory: Negative for cough and shortness of breath.   Cardiovascular: Negative for chest pain and palpitations.  Gastrointestinal: Negative for abdominal pain, diarrhea, nausea and vomiting.  Musculoskeletal: Negative for arthralgias, back pain and myalgias.  Skin: Negative for rash.  Neurological: Positive for weakness (generalized). Negative for dizziness, tremors, seizures, syncope, facial asymmetry, speech  difficulty, light-headedness, numbness and headaches.     Physical Exam Triage Vital Signs ED Triage Vitals  Enc Vitals Group     BP      Pulse      Resp      Temp      Temp src      SpO2      Weight      Height      Head Circumference      Peak Flow      Pain Score      Pain Loc      Pain Edu?      Excl. in [place]?    Orthostatic VS for the past 24 hrs:  BP- Lying Pulse- Lying BP- Sitting Pulse- Sitting BP- Standing at 0 minutes Pulse- Standing at 0 minutes  01/27/19 1303 (!) 143/97 96 134/89 102 122/87 106    Updated Vital Signs BP (!) 146/89 (BP Location: Right Arm)    Pulse (!) 102    Temp 98.3 F (36.8 C)    Resp 18    Ht 5\' 6"  (1.676 m)    Wt 230 lb (104.3 kg)    SpO2 96%    BMI 37.12 kg/m   Visual Acuity Right Eye Distance:   Left Eye Distance:   Bilateral Distance:    Right Eye Near:   Left Eye Near:    Bilateral Near:     Physical Exam Vitals signs and nursing note reviewed.  Constitutional:      Appearance: Normal appearance. He is well-developed.     Comments: Pt sitting on exam bed, appears well, NAD  HENT:     Head: Normocephalic and atraumatic.     Right Ear: Tympanic membrane normal.     Left Ear: Tympanic membrane normal.     Nose: Nose normal.     Right Sinus: No maxillary sinus tenderness or frontal sinus tenderness.     Left Sinus: No maxillary sinus tenderness or frontal sinus tenderness.     Mouth/Throat:     Lips: Pink.     Mouth: Mucous membranes are moist.     Pharynx: Oropharynx is clear. Uvula midline.  Eyes:     Extraocular Movements: Extraocular movements intact.     Pupils: Pupils are equal, round, and reactive to light.  Neck:     Musculoskeletal: Normal range of motion.  Cardiovascular:     Rate and Rhythm: Normal rate and regular rhythm.     Comments: Mild tachycardia in triage, regular rate and rhythm on exam. Pulmonary:     Effort: Pulmonary effort is normal. No respiratory distress.     Breath sounds: No stridor.  Wheezing and rhonchi present.     Comments: Diffuse wheeze and rhonchi but no respiratory distress Musculoskeletal: Normal range of motion.  Skin:    General: Skin  is warm and dry.     Capillary Refill: Capillary refill takes less than 2 seconds.  Neurological:     General: No focal deficit present.     Mental Status: He is alert and oriented to person, place, and time.     Cranial Nerves: No cranial nerve deficit.     Sensory: No sensory deficit.     Motor: No weakness.     Coordination: Coordination normal.     Gait: Gait normal.     Comments: Speech is clear. Alert to person, place and time. Normal finger to nose coordination. Negative pronator drift. Normal strength bilaterally. Normal gait.   Psychiatric:        Mood and Affect: Mood normal.        Behavior: Behavior normal.      UC Treatments / Results  Labs (all labs ordered are listed, but only abnormal results are displayed) Labs Reviewed  POCT FASTING CBG [facility] MANUAL ENTRY - Abnormal; Notable for the following components:      Result Value   POCT Glucose ([facility]) 113 (*)    All other components within normal limits  COMPLETE METABOLIC PANEL WITH GFR  POCT CBC W AUTO DIFF ([facility])    EKG None  Radiology Dg Chest 2 View  Result Date: 01/27/2019 CLINICAL DATA:  Congestion.  Diffuse wheezing. EXAM: CHEST - 2 VIEW COMPARISON:  None. FINDINGS: The heart size is normal. There is no large pleural effusion or infiltrate. There is bibasilar atelectasis or scarring. Increased attenuation of the midthoracic vertebral bodies on the lateral view is favored to be secondary to the overlapping descending aorta. There are degenerative changes throughout the visualized thoracic spine. IMPRESSION: No active cardiopulmonary disease. Electronically Signed   By: Katherine Mantlehristopher  Green M.D.   On: 01/27/2019 13:29    Procedures Procedures (including critical care time)  Medications Ordered in UC Medications - No data to  display  Initial Impression / Assessment and Plan / UC Course  I have reviewed the triage vital signs and the nursing notes.  Pertinent labs & imaging results that were available during my care of the patient were reviewed by me and considered in my medical decision making (see chart for details).     Pt does have diffuse wheeze and rhonchi, otherwise normal exam.  Pt denies chest pain or SOB despite lung sounds. CXR: normal Normal neuro exam. No evidence of emergent process taking place at his time Encouraged good hydration, plenty of rest F/u with PCP tomorrow or Friday for recheck of symptoms if needed. Advised to no long take OTC pain medication, f/u with PCP for direction  Discussed symptoms that warrant emergent care in the ED.   Final Clinical Impressions(s) / UC Diagnoses   Final diagnoses:  Fatigue, unspecified type  Generalized weakness     Discharge Instructions      Please be sure to stay well hydrated and get at least 8 hours of sleep at night.  Avoid taking over the counter medications that you know can cause drowsiness and do not drive while drowsy.  Please call your family doctor or send her a MyChart message to schedule a follow up appointment tomorrow or Friday for recheck of symptoms.  Call 911 or go to the hospital if symptoms worsening.     ED Prescriptions    None     Controlled Substance Prescriptions [place] Controlled Substance Registry consulted? Not Applicable   Rolla Platehelps, Merrie Epler O, PA-C 01/27/19 1358

## 2019-01-27 NOTE — Telephone Encounter (Signed)
Called to check on pt. Pt states he was able to rest today and is feeling better. Encouraged to f/u with his PCP or go to the hospital if symptoms worsen again.

## 2019-01-27 NOTE — Discharge Instructions (Signed)
°  Please be sure to stay well hydrated and get at least 8 hours of sleep at night.  Avoid taking over the counter medications that you know can cause drowsiness and do not drive while drowsy.  Please call your family doctor or send her a MyChart message to schedule a follow up appointment tomorrow or Friday for recheck of symptoms.  Call 911 or go to the hospital if symptoms worsening.

## 2019-01-27 NOTE — ED Triage Notes (Signed)
Patient here because his family said his speech was slurred today. He is walking and talking and laughing; said his denture might not have been in tight this morning. He has been sleepy and a little nauseous over past 2 days attributing it to OTC arthritis med.  He has not travelled past 4 weeks.

## 2019-01-28 ENCOUNTER — Ambulatory Visit (INDEPENDENT_AMBULATORY_CARE_PROVIDER_SITE_OTHER): Payer: Medicare Other | Admitting: Family Medicine

## 2019-01-28 ENCOUNTER — Encounter: Payer: Self-pay | Admitting: Family Medicine

## 2019-01-28 ENCOUNTER — Telehealth: Payer: Self-pay

## 2019-01-28 VITALS — BP 160/129 | HR 110 | Temp 98.4°F | Ht 66.0 in | Wt 233.0 lb

## 2019-01-28 DIAGNOSIS — Z888 Allergy status to other drugs, medicaments and biological substances status: Secondary | ICD-10-CM | POA: Diagnosis not present

## 2019-01-28 DIAGNOSIS — R3915 Urgency of urination: Secondary | ICD-10-CM

## 2019-01-28 DIAGNOSIS — R29810 Facial weakness: Secondary | ICD-10-CM | POA: Diagnosis not present

## 2019-01-28 DIAGNOSIS — G43809 Other migraine, not intractable, without status migrainosus: Secondary | ICD-10-CM

## 2019-01-28 DIAGNOSIS — I10 Essential (primary) hypertension: Secondary | ICD-10-CM

## 2019-01-28 DIAGNOSIS — Z79899 Other long term (current) drug therapy: Secondary | ICD-10-CM | POA: Diagnosis not present

## 2019-01-28 DIAGNOSIS — R4781 Slurred speech: Secondary | ICD-10-CM | POA: Insufficient documentation

## 2019-01-28 DIAGNOSIS — R9431 Abnormal electrocardiogram [ECG] [EKG]: Secondary | ICD-10-CM | POA: Diagnosis not present

## 2019-01-28 HISTORY — DX: Slurred speech: R47.81

## 2019-01-28 HISTORY — DX: Urgency of urination: R39.15

## 2019-01-28 LAB — COMPLETE METABOLIC PANEL WITH GFR
AG Ratio: 1.6 (calc) (ref 1.0–2.5)
ALT: 15 U/L (ref 9–46)
AST: 18 U/L (ref 10–35)
Albumin: 4.5 g/dL (ref 3.6–5.1)
Alkaline phosphatase (APISO): 72 U/L (ref 35–144)
BUN/Creatinine Ratio: 11 (calc) (ref 6–22)
BUN: 16 mg/dL (ref 7–25)
CO2: 22 mmol/L (ref 20–32)
Calcium: 9.8 mg/dL (ref 8.6–10.3)
Chloride: 105 mmol/L (ref 98–110)
Creat: 1.43 mg/dL — ABNORMAL HIGH (ref 0.70–1.18)
GFR, Est African American: 57 mL/min/{1.73_m2} — ABNORMAL LOW (ref >=60)
GFR, Est Non African American: 49 mL/min/{1.73_m2} — ABNORMAL LOW (ref >=60)
Globulin: 2.8 g/dL (calc) (ref 1.9–3.7)
Glucose, Bld: 96 mg/dL (ref 65–99)
Potassium: 4.6 mmol/L (ref 3.5–5.3)
Sodium: 139 mmol/L (ref 135–146)
Total Bilirubin: 0.3 mg/dL (ref 0.2–1.2)
Total Protein: 7.3 g/dL (ref 6.1–8.1)

## 2019-01-28 MED ORDER — GENERIC EXTERNAL MEDICATION
Status: DC
Start: ? — End: 2019-01-28

## 2019-01-28 MED ORDER — SODIUM CHLORIDE 0.9 % IV SOLN
10.00 | INTRAVENOUS | Status: DC
Start: ? — End: 2019-01-28

## 2019-01-28 MED ORDER — SULFAMETHOXAZOLE-TRIMETHOPRIM 800-160 MG PO TABS: 1.0000 | ORAL_TABLET | Freq: Two times a day (BID) | ORAL | 0 refills | Status: DC

## 2019-01-28 NOTE — Telephone Encounter (Signed)
Spoke with patient, feeling better.  Will follow up with Dr Madilyn Fireman as needed.

## 2019-01-28 NOTE — Progress Notes (Signed)
Acute Office Visit  Subjective:    Patient ID: Jose Waller, male    DOB: Jan 11, 1951, 68 y.o.   MRN: [id number]  Chief Complaint  Patient presents with  . Aphasia    HPI Patient is in today for fatigue and weakness and slurred speech.  He is here today with his middle daughter.  He complains that on Tuesday he actually took a Tylenol arthritis mostly for knee pain.  By the next day he was speaking to a family member, his sister, who felt like he was slurring his words.  He said the Tylenol arthritis was in a quite brand and he had just opened a new bottle when he took it.  He says the several days prior he had been working long hours and going antiquing.  He was actually seen in our urgent care 2 days ago and evaluated.  He had a CBC and a chest x-ray at that time because he had some abnormal lung sounds.  No prior history of stroke.  Chest x-ray was normal.  The time he complained more of just fatigue and generalized weakness.  He then woke up this morning and speech was still slurred so he actually went to the emergency department.  He had some family with him.  The daughter who is with him today said that the speech seemed to be waxing and waning.  Sometimes it seems very clear and that other times it seems like he really was slurring more.  He does wear dentures but feels like they are in proper place.  He did a CMP and kidney function was 1.32.  Did do a CTA as well which was essentially negative showing no sign of blockage.  He also complains of some urinary frequency that is been going on for about 2 weeks.  He is also been having some urgency where he feels like sometimes he is not going to make it.  This was not there before.  They did do a urinalysis in the emergency department yesterday and it did show some positive leukocytes as well as some trace bacteria.  As far as I can tell I do not see that a culture was sent point.  ABC and urine drug screen were normal.  Also reports that  he is not of nasal congestion and headaches over the last 2-week.  In fact his daughter says he is been taking a lot of BC powders multiple times a day and she is concerned about that.  He does report more increased stress recently.  Today he is still noting some slightly slurred speech and his daughter points out that he is had a little bit of facial droop as well..  Past Medical History:  Diagnosis Date  . CKD (chronic kidney disease) stage 3, GFR 30-59 ml/min (HCC) 06/05/2018  . Cluster headache     Past Surgical History:  Procedure Laterality Date  . back lumbar  2004   fusion     Family History  Problem Relation Age of Onset  . Lung cancer Father        lung/ was a Glass blower/designer  . Alcohol abuse Other   . Hypertension Brother   . Dementia Mother   . Coronary artery disease Brother     Social History   Socioeconomic History  . Marital status: Widowed    Spouse name: Not on file  . Number of children: 2  . Years of education: 47  . Highest education level: 11th grade  Occupational History  . Occupation: retired    Comment: Technical sales engineerpest control technician  Social Needs  . Financial resource strain: Not hard at all  . Food insecurity    Worry: Never true    Inability: Never true  . Transportation needs    Medical: No    Non-medical: No  Tobacco Use  . Smoking status: Former Smoker    Quit date: 07/29/2001    Years since quitting: 17.5  . Smokeless tobacco: Never Used  Substance and Sexual Activity  . Alcohol use: No  . Drug use: Not Currently    Comment: Former cocaine user, quit 1998  . Sexual activity: Yes    Comment: married, 2 kids, on disability, regularly exercises.  Lifestyle  . Physical activity    Days per week: 0 days    Minutes per session: 0 min  . Stress: Not at all  Relationships  . Social connections    Talks on phone: More than three times a week    Gets together: Never    Attends religious service: More than 4 times per year    Active member of club  or organization: No    Attends meetings of clubs or organizations: Never    Relationship status: Widowed  . Intimate partner violence    Fear of current or ex partner: No    Emotionally abused: No    Physically abused: No    Forced sexual activity: No  Other Topics Concern  . Not on file  Social History Narrative   On disability.  Some exercise. Wife passed away in a motor vehicle accident. He is raising 2 daughters by himself. Former drug addict. Likes to stay busy. 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 ibuprofen (ADVIL,MOTRIN) 800 MG tablet TAKE 1 TABLET BY MOUTH EVERY 8 HOURS WITH FOOD AS NEEDED 90 tablet 3  . lisinopril (PRINIVIL,ZESTRIL) 10 MG tablet TAKE 1 TABLET BY MOUTH ONCE DAILY 90 tablet 3  . omeprazole (PRILOSEC) 40 MG capsule Take 1 capsule (40 mg total) by mouth daily. 90 capsule 3  . sildenafil (REVATIO) 20 MG tablet TAKE 2-5 TABLETS (40-100 MG TOTAL) BY MOUTH DAILY AS NEEDED. 20 tablet 0   No facility-administered medications prior to visit.     Allergies  Allergen Reactions  . Prednisone     REACTION: hick ups    ROS     Objective:    Physical Exam  Constitutional: He is oriented to person, place, and time. He appears well-developed and well-nourished.  He seemed a little bit more short of breath in the room than his norm but he was also wearing a facial mass so it is difficult to tell if that was causing him to feel like he needed to breathe more heavily.  Encouraged his daughter to keep an eye on this and let me know if it gets worse.  HENT:  Head: Normocephalic and atraumatic.  Right Ear: External ear normal.  Left Ear: External ear normal.  Eyes: Conjunctivae are normal. Right eye exhibits no discharge. Left eye exhibits no discharge.  Neck: Neck supple. No thyromegaly present.  Cardiovascular: Normal rate, regular rhythm and normal heart sounds.  Pulmonary/Chest:  Effort normal.  Crackles and rhonchi at the bases bilaterally.  Proved after cough.  Lymphadenopathy:    He has no cervical adenopathy.  Neurological: He is alert and oriented to person, place, and time.  Skin: Skin  is warm and dry.  Psychiatric: He has a normal mood and affect. His behavior is normal.    BP (!) 160/129   Pulse (!) 110   Temp 98.4 F (36.9 C) (Oral)   Ht 5\' 6"  (1.676 m)   Wt 233 lb (105.7 kg)   SpO2 98%   BMI 37.61 kg/m  Wt Readings from Last 3 Encounters:  01/28/19 233 lb (105.7 kg)  01/27/19 230 lb (104.3 kg)  08/24/18 231 lb (104.8 kg)    There are no preventive care reminders to display for this patient.  There are no preventive care reminders to display for this patient.   Lab Results  Component Value Date   TSH 0.776 05/16/2008   No results found for: WBC, HGB, HCT, MCV, PLT Lab Results  Component Value Date   NA 139 01/27/2019   K 4.6 01/27/2019   CO2 22 01/27/2019   GLUCOSE 96 01/27/2019   BUN 16 01/27/2019   CREATININE 1.43 (H) 01/27/2019   BILITOT 0.3 01/27/2019   ALKPHOS 71 05/15/2016   AST 18 01/27/2019   ALT 15 01/27/2019   PROT 7.3 01/27/2019   ALBUMIN 4.1 05/15/2016   CALCIUM 9.8 01/27/2019   Lab Results  Component Value Date   CHOL 194 12/03/2018   Lab Results  Component Value Date   HDL 42 12/03/2018   Lab Results  Component Value Date   LDLCALC 128 (H) 12/03/2018   Lab Results  Component Value Date   TRIG 125 12/03/2018   Lab Results  Component Value Date   CHOLHDL 4.6 12/03/2018   No results found for: HGBA1C     Assessment & Plan:   Problem List Items Addressed This Visit      Cardiovascular and Mediastinum   Migraine    Does have a history of migraines and has been taking a lot of BC powders because of more frequent headaches over the last couple weeks he is also had a lot of nasal congestion.  I did go back and look at the CT report they did not note any evidence of sinusitis.  I meant nasal saline  irrigation or Nettie pot.      HYPERTENSION    Blood pressure is not well controlled today so we discussed increasing his lisinopril to 20 mg daily.  Have him follow-up on Monday for a nurse visit for blood pressure check.        Other   Urinary urgency    Possible UTI.  Urinalysis done in emergency department showed bacteria and leukocytes.  Over the holiday weekend because we are going to be closed tomorrow get a go ahead and put him on an antibiotic and see if he improves.  When I see him back in the office on Monday to recheck his blood pressure and to see if the urinary symptoms improve if not then consider medication for overactive bladder.      Slurred speech - Primary    Thus far work-up was negative including CTA, EKG and chest x-ray as well as labs.  Unclear etiology can.  Consider Bell's palsy as he also has some left-sided facial droop.       Other Visit Diagnoses    Facial droop         Just to be on the safe side encouraged him not to drive until he is feeling better on Monday.  Meds ordered this encounter  Medications  . sulfamethoxazole-trimethoprim (BACTRIM DS) 800-160 MG tablet  Sig: Take 1 tablet by mouth 2 (two) times daily.    Dispense:  10 tablet    Refill:  0     Nani Gasseratherine , MD

## 2019-01-28 NOTE — Assessment & Plan Note (Signed)
Does have a history of migraines and has been taking a lot of BC powders because of more frequent headaches over the last couple weeks he is also had a lot of nasal congestion.  I did go back and look at the CT report they did not note any evidence of sinusitis.  I meant nasal saline irrigation or Nettie pot.

## 2019-01-28 NOTE — Assessment & Plan Note (Signed)
Thus far work-up was negative including CTA, EKG and chest x-ray as well as labs.  Unclear etiology can.  Consider Bell's palsy as he also has some left-sided facial droop.

## 2019-01-28 NOTE — Patient Instructions (Addendum)
Increase your lisinopril to 2 tabs daily. Bell Palsy, Adult  Bell palsy is a short-term inability to move muscles in part of the face. The inability to move (paralysis) results from inflammation or compression of the facial nerve, which travels along the skull and under the ear to the side of the face (7th cranial nerve). This nerve is responsible for facial movements that include blinking, closing the eyes, smiling, and frowning. What are the causes? The exact cause of this condition is not known. It may be caused by an infection from a virus, such as the chickenpox (herpes zoster), Epstein-Barr, or mumps virus. What increases the risk? You are more likely to develop this condition if:  You are pregnant.  You have diabetes.  You have had a recent infection in your nose, throat, or airways (upper respiratory infection).  You have a weakened body defense system (immune system).  You have had a facial injury, such as a fracture.  You have a family history of Bell palsy. What are the signs or symptoms? Symptoms of this condition include:  Weakness on one side of the face.  Drooping eyelid and corner of the mouth.  Excessive tearing in one eye.  Difficulty closing the eyelid.  Dry eye.  Drooling.  Dry mouth.  Changes in taste.  Change in facial appearance.  Pain behind one ear.  Ringing in one or both ears.  Sensitivity to sound in one ear.  Facial twitching.  Headache.  Impaired speech.  Dizziness.  Difficulty eating or drinking. Most of the time, only one side of the face is affected. Rarely, Bell palsy affects the whole face. How is this diagnosed? This condition is diagnosed based on:  Your symptoms.  Your medical history.  A physical exam. You may also have to see health care providers who specialize in disorders of the nerves (neurologist) or diseases and conditions of the eye (ophthalmologist). You may have tests, such as:  A test to check for  nerve damage (electromyogram).  Imaging studies, such as CT or MRI scans.  Blood tests. How is this treated? This condition affects every person differently. Sometimes symptoms go away without treatment within a couple weeks. If treatment is needed, it varies from person to person. The goal of treatment is to reduce inflammation and protect the eye from damage. Treatment for Bell palsy may include:  Medicines, such as: ? Steroids to reduce swelling and inflammation. ? Antiviral drugs. ? Pain relievers, including aspirin, acetaminophen, or ibuprofen.  Eye drops or ointment to keep your eye moist.  Eye protection, if you cannot close your eye.  Exercises or massage to regain muscle strength and function (physical therapy). Follow these instructions at home:   Take over-the-counter and prescription medicines only as told by your health care provider.  If your eye is affected: ? Keep your eye moist with eye drops or ointment as told by your health care provider. ? Follow instructions for eye care and protection as told by your health care provider.  Do any physical therapy exercises as told by your health care provider.  Keep all follow-up visits as told by your health care provider. This is important. Contact a health care provider if:  You have a fever.  Your symptoms do not get better within 2-3 weeks, or your symptoms get worse.  Your eye is red, irritated, or painful.  You have new symptoms. Get help right away if:  You have weakness or numbness in a part of your body  other than your face.  You have trouble swallowing.  You develop neck pain or stiffness.  You develop dizziness or shortness of breath. Summary  Bell palsy is a short-term inability to move muscles in part of the face. The inability to move (paralysis) results from inflammation or compression of the facial nerve.  This condition affects every person differently. Sometimes symptoms go away without  treatment within a couple weeks.  If treatment is needed, it varies from person to person. The goal of treatment is to reduce inflammation and protect the eye from damage.  Contact your health care provider if your symptoms do not get better within 2-3 weeks, or your symptoms get worse. This information is not intended to replace advice given to you by your health care provider. Make sure you discuss any questions you have with your health care provider. Document Released: 07/15/2005 Document Revised: 06/27/2017 Document Reviewed: 09/17/2016 Elsevier Patient Education  2020 [employer]

## 2019-01-28 NOTE — Assessment & Plan Note (Signed)
Blood pressure is not well controlled today so we discussed increasing his lisinopril to 20 mg daily.  Have him follow-up on Monday for a nurse visit for blood pressure check.

## 2019-01-28 NOTE — Assessment & Plan Note (Signed)
Possible UTI.  Urinalysis done in emergency department showed bacteria and leukocytes.  Over the holiday weekend because we are going to be closed tomorrow get a go ahead and put him on an antibiotic and see if he improves.  When I see him back in the office on Monday to recheck his blood pressure and to see if the urinary symptoms improve if not then consider medication for overactive bladder.

## 2019-02-01 ENCOUNTER — Ambulatory Visit (INDEPENDENT_AMBULATORY_CARE_PROVIDER_SITE_OTHER): Payer: Medicare Other | Admitting: Family Medicine

## 2019-02-01 ENCOUNTER — Other Ambulatory Visit: Payer: Self-pay

## 2019-02-01 ENCOUNTER — Encounter: Payer: Self-pay | Admitting: Family Medicine

## 2019-02-01 VITALS — BP 116/79 | HR 110 | Wt 219.0 lb

## 2019-02-01 DIAGNOSIS — I10 Essential (primary) hypertension: Secondary | ICD-10-CM | POA: Diagnosis not present

## 2019-02-01 DIAGNOSIS — R0689 Other abnormalities of breathing: Secondary | ICD-10-CM | POA: Diagnosis not present

## 2019-02-01 DIAGNOSIS — J019 Acute sinusitis, unspecified: Secondary | ICD-10-CM

## 2019-02-01 MED ORDER — LISINOPRIL 20 MG PO TABS: 20.0000 mg | ORAL_TABLET | Freq: Every day | ORAL | 0 refills | Status: DC

## 2019-02-01 MED ORDER — AZITHROMYCIN 250 MG PO TABS: ORAL_TABLET | ORAL | 0 refills | Status: AC

## 2019-02-01 NOTE — Progress Notes (Signed)
Established Patient Office Visit  Subjective:  Patient ID: Jose Waller, male    DOB: 1951/07/26  Age: 68 y.o. MRN: [id number]  CC:  Chief Complaint  Patient presents with  . Blood Pressure Check    HPI Jose Waller presents for Nurse visit to recheck BP.  We increased his lisinopril to 20mg  last week.  Please see previous note.  He also seemed to be breathing more heavily. He felt it was from his mask but had him do a walk test as well.    Also continue to c/o of sinus pressure and haeaches. Uses Occ BC powder.    Past Medical History:  Diagnosis Date  . CKD (chronic kidney disease) stage 3, GFR 30-59 ml/min (HCC) 06/05/2018  . Cluster headache     Past Surgical History:  Procedure Laterality Date  . back lumbar  2004   fusion     Family History  Problem Relation Age of Onset  . Lung cancer Father        lung/ was a Hydrologistminer  . Alcohol abuse Other   . Hypertension Brother   . Dementia Mother   . Coronary artery disease Brother     Social History   Socioeconomic History  . Marital status: Widowed    Spouse name: Not on file  . Number of children: 2  . Years of education: 3311  . Highest education level: 11th grade  Occupational History  . Occupation: retired    Comment: Technical sales engineerpest control technician  Social Needs  . Financial resource strain: Not hard at all  . Food insecurity    Worry: Never true    Inability: Never true  . Transportation needs    Medical: No    Non-medical: No  Tobacco Use  . Smoking status: Former Smoker    Quit date: 07/29/2001    Years since quitting: 17.5  . Smokeless tobacco: Never Used  Substance and Sexual Activity  . Alcohol use: No  . Drug use: Not Currently    Comment: Former cocaine user, quit 1998  . Sexual activity: Yes    Comment: married, 2 kids, on disability, regularly exercises.  Lifestyle  . Physical activity    Days per week: 0 days    Minutes per session: 0 min  . Stress: Not at all  Relationships  . Social  connections    Talks on phone: More than three times a week    Gets together: Never    Attends religious service: More than 4 times per year    Active member of club or organization: No    Attends meetings of clubs or organizations: Never    Relationship status: Widowed  . Intimate partner violence    Fear of current or ex partner: No    Emotionally abused: No    Physically abused: No    Forced sexual activity: No  Other Topics Concern  . Not on file  Social History Narrative   On disability.  Some exercise. Wife passed away in a motor vehicle accident. He is raising 2 daughters by himself. Former drug addict. Likes to stay busy. 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 ibuprofen (ADVIL,MOTRIN) 800 MG tablet TAKE 1 TABLET BY MOUTH EVERY 8 HOURS WITH FOOD AS NEEDED 90 tablet 3  . omeprazole (PRILOSEC) 40 MG capsule Take 1 capsule (40 mg total) by mouth daily. 90 capsule 3  .  sildenafil (REVATIO) 20 MG tablet TAKE 2-5 TABLETS (40-100 MG TOTAL) BY MOUTH DAILY AS NEEDED. 20 tablet 0  . sulfamethoxazole-trimethoprim (BACTRIM DS) 800-160 MG tablet Take 1 tablet by mouth 2 (two) times daily. 10 tablet 0  . lisinopril (PRINIVIL,ZESTRIL) 10 MG tablet TAKE 1 TABLET BY MOUTH ONCE DAILY 90 tablet 3   No facility-administered medications prior to visit.     Allergies  Allergen Reactions  . Prednisone     REACTION: hick ups    ROS Review of Systems    Objective:    Physical Exam  BP 116/79   Pulse (!) 110   Wt 219 lb (99.3 kg)   SpO2 97%   BMI 35.35 kg/m  Wt Readings from Last 3 Encounters:  02/01/19 219 lb (99.3 kg)  01/28/19 233 lb (105.7 kg)  01/27/19 230 lb (104.3 kg)     There are no preventive care reminders to display for this patient.  There are no preventive care reminders to display for this patient.  Lab Results  Component Value Date   TSH 0.776 05/16/2008   No results found  for: WBC, HGB, HCT, MCV, PLT Lab Results  Component Value Date   NA 139 01/27/2019   K 4.6 01/27/2019   CO2 22 01/27/2019   GLUCOSE 96 01/27/2019   BUN 16 01/27/2019   CREATININE 1.43 (H) 01/27/2019   BILITOT 0.3 01/27/2019   ALKPHOS 71 05/15/2016   AST 18 01/27/2019   ALT 15 01/27/2019   PROT 7.3 01/27/2019   ALBUMIN 4.1 05/15/2016   CALCIUM 9.8 01/27/2019   Lab Results  Component Value Date   CHOL 194 12/03/2018   Lab Results  Component Value Date   HDL 42 12/03/2018   Lab Results  Component Value Date   LDLCALC 128 (H) 12/03/2018   Lab Results  Component Value Date   TRIG 125 12/03/2018   Lab Results  Component Value Date   CHOLHDL 4.6 12/03/2018   No results found for: HGBA1C    Assessment & Plan:   Problem List Items Addressed This Visit      Cardiovascular and Mediastinum   HYPERTENSION - Primary    Bp better today though pulse is still high.        Relevant Medications   lisinopril (ZESTRIL) 20 MG tablet    Other Visit Diagnoses    Heavy breathing       Acute non-recurrent sinusitis, unspecified location       Relevant Medications   azithromycin (ZITHROMAX) 250 MG tablet     Heavy breathing - walk test was normal today.    Sinus congestion/HA > 1 week - will tx for sinusitis with zpack.  Call if nto better in one week.   Meds ordered this encounter  Medications  . lisinopril (ZESTRIL) 20 MG tablet    Sig: Take 1 tablet (20 mg total) by mouth daily.    Dispense:  90 tablet    Refill:  0    D/C lisinopril 10 rx.  Marland Kitchen azithromycin (ZITHROMAX) 250 MG tablet    Sig: 2 Ttabs PO on Day 1, then one a day x 4 days.    Dispense:  6 tablet    Refill:  0    Follow-up: No follow-ups on file.    Beatrice Lecher, MD

## 2019-02-01 NOTE — Assessment & Plan Note (Signed)
Bp better today though pulse is still high.

## 2019-02-26 ENCOUNTER — Telehealth: Payer: Self-pay

## 2019-02-26 MED ORDER — OXYBUTYNIN CHLORIDE ER 5 MG PO TB24: 5.0000 mg | ORAL_TABLET | Freq: Every day | ORAL | 1 refills | Status: DC

## 2019-02-26 NOTE — Telephone Encounter (Signed)
Okay, let us try a low-dose of oxybutynin.  Prescription sent to [facility].  Call if we need to increase the dose.

## 2019-02-26 NOTE — Telephone Encounter (Signed)
Jose Waller states he did finish the antibiotic. He is still having urinary frequency. He would like something to control the frequent urination. Please advise.

## 2019-03-01 NOTE — Telephone Encounter (Signed)
Called patient and notified that med sent to pharmacy and to let us know if dose needs to be increased. Patient verbalized understanding. KG LPN

## 2019-03-29 DIAGNOSIS — Z711 Person with feared health complaint in whom no diagnosis is made: Secondary | ICD-10-CM | POA: Diagnosis not present

## 2019-04-22 ENCOUNTER — Encounter: Payer: Self-pay | Admitting: Family Medicine

## 2019-04-22 ENCOUNTER — Ambulatory Visit (INDEPENDENT_AMBULATORY_CARE_PROVIDER_SITE_OTHER): Payer: Medicare Other | Admitting: Family Medicine

## 2019-04-22 VITALS — BP 135/71 | HR 88 | Ht 66.0 in | Wt 228.0 lb

## 2019-04-22 DIAGNOSIS — M170 Bilateral primary osteoarthritis of knee: Secondary | ICD-10-CM | POA: Insufficient documentation

## 2019-04-22 DIAGNOSIS — M1712 Unilateral primary osteoarthritis, left knee: Secondary | ICD-10-CM | POA: Diagnosis not present

## 2019-04-22 DIAGNOSIS — R3915 Urgency of urination: Secondary | ICD-10-CM

## 2019-04-22 DIAGNOSIS — H1032 Unspecified acute conjunctivitis, left eye: Secondary | ICD-10-CM

## 2019-04-22 HISTORY — DX: Bilateral primary osteoarthritis of knee: M17.0

## 2019-04-22 MED ORDER — ERYTHROMYCIN 5 MG/GM OP OINT: 1.0000 "application " | TOPICAL_OINTMENT | Freq: Three times a day (TID) | OPHTHALMIC | 0 refills | Status: DC

## 2019-04-22 MED ORDER — OXYBUTYNIN CHLORIDE ER 10 MG PO TB24: 10.0000 mg | ORAL_TABLET | Freq: Every day | ORAL | 3 refills | Status: DC

## 2019-04-22 MED ORDER — SILDENAFIL CITRATE 20 MG PO TABS: 40.0000 mg | ORAL_TABLET | Freq: Every day | ORAL | 5 refills | Status: DC | PRN

## 2019-04-22 NOTE — Assessment & Plan Note (Signed)
Ok to increase dose oxybutynin to 10 mg.  I did warm about potential side effects including dry mouth and constipation if he increases his dose and to look out for those if he has any problems we can always go back down but we can certainly try it for a month

## 2019-04-22 NOTE — Progress Notes (Signed)
Acute Office Visit  Subjective:    Patient ID: Jose Waller, male    DOB: [birth year], 68 y.o.   MRN: [id number]  Chief Complaint  Patient presents with  . Conjunctivitis    HPI Patient is in today eye sxs x 2-3 days. Says it has been red and now it is crusty when he woke up this morning.  He says yesterday it felt a little blurry but it actually had a most like a film on it.  Today he feels like he can see fine out of it.  He denies any pain or irritation or scratching.  Denies any respiratory or cold symptoms.  No fevers chills or sweats.  He also wanted to discuss going up on his oxybutynin.  He still having significant urgency with urination and would like to at least try a higher dose.  He also let me know that he is enrolled in a research study for arthritis of the knees.  He says he had an injection of his left knee which has been bothering him for years he last week.  He says instantly he got relief and has not had any pain in that knee since then.  He says that they will consider repeating the injection in 6 months if he still having for symptoms.  He also asked for a  refill for his sildenafil today.  Past Medical History:  Diagnosis Date  . CKD (chronic kidney disease) stage 3, GFR 30-59 ml/min (HCC) 06/05/2018  . Cluster headache     Past Surgical History:  Procedure Laterality Date  . back lumbar  2004   fusion     Family History  Problem Relation Age of Onset  . Lung cancer Father        lung/ was a Hydrologistminer  . Alcohol abuse Other   . Hypertension Brother   . Dementia Mother   . Coronary artery disease Brother     Social History   Socioeconomic History  . Marital status: Widowed    Spouse name: Not on file  . Number of children: 2  . Years of education: 5811  . Highest education level: 11th grade  Occupational History  . Occupation: retired    Comment: Technical sales engineerpest control technician  Social Needs  . Financial resource strain: Not hard at all  . Food  insecurity    Worry: Never true    Inability: Never true  . Transportation needs    Medical: No    Non-medical: No  Tobacco Use  . Smoking status: Former Smoker    Quit date: 07/29/2001    Years since quitting: 17.7  . Smokeless tobacco: Never Used  Substance and Sexual Activity  . Alcohol use: No  . Drug use: Not Currently    Comment: Former cocaine user, quit 1998  . Sexual activity: Yes    Comment: married, 2 kids, on disability, regularly exercises.  Lifestyle  . Physical activity    Days per week: 0 days    Minutes per session: 0 min  . Stress: Not at all  Relationships  . Social connections    Talks on phone: More than three times a week    Gets together: Never    Attends religious service: More than 4 times per year    Active member of club or organization: No    Attends meetings of clubs or organizations: Never    Relationship status: Widowed  . Intimate partner violence    Fear of current  or ex partner: No    Emotionally abused: No    Physically abused: No    Forced sexual activity: No  Other Topics Concern  . Not on file  Social History Narrative   On disability.  Some exercise. Wife passed away in a motor vehicle accident. He is raising 2 daughters by himself. Former drug addict. Likes to stay busy. 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 ibuprofen (ADVIL,MOTRIN) 800 MG tablet TAKE 1 TABLET BY MOUTH EVERY 8 HOURS WITH FOOD AS NEEDED 90 tablet 3  . lisinopril (ZESTRIL) 20 MG tablet Take 1 tablet (20 mg total) by mouth daily. 90 tablet 0  . omeprazole (PRILOSEC) 40 MG capsule Take 1 capsule (40 mg total) by mouth daily. 90 capsule 3  . oxybutynin (DITROPAN XL) 5 MG 24 hr tablet Take 1 tablet (5 mg total) by mouth at bedtime. 30 tablet 1  . sildenafil (REVATIO) 20 MG tablet TAKE 2-5 TABLETS (40-100 MG TOTAL) BY MOUTH DAILY AS NEEDED. 20 tablet 0   No facility-administered  medications prior to visit.     Allergies  Allergen Reactions  . Prednisone     REACTION: hick ups    ROS     Objective:    Physical Exam  Constitutional: He is oriented to person, place, and time. He appears well-developed and well-nourished.  HENT:  Head: Normocephalic and atraumatic.  Right Ear: External ear normal.  Left Ear: External ear normal.  Nose: Nose normal.  Mouth/Throat: Oropharynx is clear and moist.  TMs and canals are clear.  Left eye with mildly injected sclera. Hard time telling his his Pupils were reacting normally.    Eyes: Conjunctivae and EOM are normal.  Neck: Neck supple. No thyromegaly present.  Cardiovascular: Normal rate and normal heart sounds.  Pulmonary/Chest: Effort normal and breath sounds normal.  Lymphadenopathy:    He has no cervical adenopathy.  Neurological: He is alert and oriented to person, place, and time.  Skin: Skin is warm and dry.  Psychiatric: He has a normal mood and affect.    BP 135/71   Pulse 88   Ht 5\' 6"  (1.676 m)   Wt 228 lb (103.4 kg)   SpO2 98%   BMI 36.80 kg/m  Wt Readings from Last 3 Encounters:  04/22/19 228 lb (103.4 kg)  02/01/19 219 lb (99.3 kg)  01/28/19 233 lb (105.7 kg)    There are no preventive care reminders to display for this patient.  There are no preventive care reminders to display for this patient.   Lab Results  Component Value Date   TSH 0.776 05/16/2008   No results found for: WBC, HGB, HCT, MCV, PLT Lab Results  Component Value Date   NA 139 01/27/2019   K 4.6 01/27/2019   CO2 22 01/27/2019   GLUCOSE 96 01/27/2019   BUN 16 01/27/2019   CREATININE 1.43 (H) 01/27/2019   BILITOT 0.3 01/27/2019   ALKPHOS 71 05/15/2016   AST 18 01/27/2019   ALT 15 01/27/2019   PROT 7.3 01/27/2019   ALBUMIN 4.1 05/15/2016   CALCIUM 9.8 01/27/2019   Lab Results  Component Value Date   CHOL 194 12/03/2018   Lab Results  Component Value Date   HDL 42 12/03/2018   Lab Results   Component Value Date   LDLCALC 128 (H) 12/03/2018   Lab Results  Component Value Date   TRIG 125 12/03/2018  Lab Results  Component Value Date   CHOLHDL 4.6 12/03/2018   No results found for: HGBA1C     Assessment & Plan:   Problem List Items Addressed This Visit      Musculoskeletal and Integument   Primary osteoarthritis of left knee    Entered into research study.          Other   Urinary urgency - Primary    Ok to increase dose oxybutynin to 10 mg.  I did warm about potential side effects including dry mouth and constipation if he increases his dose and to look out for those if he has any problems we can always go back down but we can certainly try it for a month       Other Visit Diagnoses    Acute conjunctivitis of left eye, unspecified acute conjunctivitis type         Acute conjunctivitis-he actually feels like it is a little better today.  So it may just be a viral infection versus bacterial but I did go ahead and give him some erythromycin ophthalmic ointment to start in the next 24 hours if is not improving.  He denies any visual changes.   Meds ordered this encounter  Medications  . sildenafil (REVATIO) 20 MG tablet    Sig: Take 2-5 tablets (40-100 mg total) by mouth daily as needed.    Dispense:  20 tablet    Refill:  5  . erythromycin ophthalmic ointment    Sig: Place 1 application into the left eye 3 (three) times daily. X 5 days    Dispense:  3.5 g    Refill:  0  . oxybutynin (DITROPAN-XL) 10 MG 24 hr tablet    Sig: Take 1 tablet (10 mg total) by mouth at bedtime.    Dispense:  90 tablet    Refill:  3     Beatrice Lecher, MD

## 2019-04-22 NOTE — Assessment & Plan Note (Signed)
Entered into research study.

## 2019-05-19 ENCOUNTER — Encounter: Payer: Self-pay | Admitting: Family Medicine

## 2019-05-19 ENCOUNTER — Ambulatory Visit (INDEPENDENT_AMBULATORY_CARE_PROVIDER_SITE_OTHER): Payer: Medicare Other | Admitting: Family Medicine

## 2019-05-19 ENCOUNTER — Other Ambulatory Visit: Payer: Self-pay

## 2019-05-19 VITALS — BP 136/102 | HR 97 | Temp 98.1°F | Wt 226.0 lb

## 2019-05-19 DIAGNOSIS — H5789 Other specified disorders of eye and adnexa: Secondary | ICD-10-CM | POA: Diagnosis not present

## 2019-05-19 NOTE — Patient Instructions (Addendum)
Thank you for coming in today.  Use artifical tears like Systme  Also use over-the-counter Zaditor eyedrops (Ketotifen) twice daily as needed for itching.  If not better let Dr Linford Arnold know and next step is referral to the eye doctor.  [facility] Eye Surgeon   STOP Naphcon-A.  If not getting better in about 2 weeks let us know.    Dry Eye  Dry eye, also called keratoconjunctivitis sicca, is dryness of the membranes surrounding the eye. It happens when there are not enough healthy, natural tears in the eyes. The eyes must remain moist at all times. A small amount of tears is constantly produced by the tear glands (lacrimal glands). These glands are located under the outside part of the upper eyelids. Dry eye can happen on its own or be a symptom of several conditions, such as rheumatoid arthritis, lupus, or Sjgren's syndrome. Dry eye may be mild to severe. What are the causes? This condition may be caused by:  Not making enough tears (aqueous tear-deficient dry eyes).  Tears evaporating from the eyes too quickly (evaporative dry eyes). This is when there is an abnormality in the quality of your tears. This abnormality causes your tears to evaporate so quickly that the eyes cannot be kept moist. What increases the risk? You are more likely to develop this condition if you:  Are a woman, especially if you have gone through menopause.  Live in a dry climate.  Live in a dusty or smoky area.  Take certain medicines, such as: ? Anti-allergy medicines (antihistamines). ? Blood pressure medicines (antihypertensives). ? Birth control pills (oral contraceptives). ? Laxatives. ? Tranquilizers.  Have a history of refractive eye surgery, such as LASIK.  Have a history of long-term contact lens use. What are the signs or symptoms? Symptoms of this condition include:  Irritation.  Itchiness.  Redness.  Burning.  Inflammation of the eyelids.  Feeling as though something is  stuck in the eye.  Light sensitivity.  Increased sensitivity and discomfort when wearing contact lenses.  Vision that varies throughout the day.  Occasional excessive tearing. How is this diagnosed? This condition is diagnosed based on your symptoms, your medical history, and an eye exam.  Your health care provider may look at your eye using a microscope and may put dyes in your eye to check the health of the surface of your eye.  You may have tests, such as a test to evaluate your tear production (Schirmer test). ? During this test, a small strip of special paper is gently pressed into the inner corner of your eye. ? Your tear production is measured by how much of the paper is moistened by your tears during a set amount of time. You may be referred to a health care provider who specializes in eyes and eyesight (ophthalmologist). How is this treated? Treatment for this condition depends on the severity. Mild cases are often treated at home. To help relieve your symptoms, your health care provider may recommend eye drops, which are also called artificial tears.  If your condition is severe, treatment may include: ? Prescription eye drops. ? Over-the-counter or prescription ointments to moisten your eyes. ? Minor surgery to place plugs into the tear ducts. This keep tears from draining so that tears can stay on the surface of the eye longer. ? Medicines to reduce inflammation of the eyelids. ? Taking an omega-3 fatty acid nutritional supplement. Follow these instructions at home:  Take or apply over-the-counter and prescription medicines only  as told by your health care provider. This includes eye drops.  If directed, apply a warm compress to your eyes to help reduce inflammation. Place a towel over your eyes and gently press the warm compress over your eyes for about 5 minutes, or as long as told by your health care provider.  Drink plenty of fluids to stay well hydrated.  If  possible, avoid dry, drafty environments.  Wear sunglasses when outdoors to protect your eyes from the sun and wind.  Use a humidifier at home to increase moisture in the air.  Remember to blink often when reading or using the computer for long periods.  If you wear contact lenses, remove them regularly to give your eyes a break. Always remove contacts before sleeping.  Have a yearly eye exam and vision test.  Keep all follow-up visits as told by your health care provider. This is important. Contact a health care provider if:  You have eye pain.  You have pus-like fluid coming from your eye.  Your symptoms get worse or do not improve with treatment. Get help right away if:  Your vision suddenly changes. Summary  Dry eye is dryness of the membranes surrounding the eye.  Dry eye can happen on its own or be a symptom of several conditions, such as rheumatoid arthritis, lupus, or Sjgren's syndrome.  This condition is diagnosed based on your symptoms, your medical history, and an eye exam.  Treatment for this condition depends on the severity. Mild cases are often treated at home. To help relieve your symptoms, your health care provider may recommend eye drops, which are also called artificial tears. This information is not intended to replace advice given to you by your health care provider. Make sure you discuss any questions you have with your health care provider. Document Released: 06/01/2004 Document Revised: 01/06/2018 Document Reviewed: 01/06/2018 Elsevier Patient Education  2020 [employer].

## 2019-05-19 NOTE — Progress Notes (Signed)
KAYIN OSMENT is a 68 y.o. male who presents to [facility] today for left eye irritation.  Bejamin recently developed Bell's palsy on the left side.  He has had some eye irritation since.  He was seen by his PCP about a month ago and thought to have conjunctivitis and possibly dry eye secondary to his Bell's palsy.  He was treated with erythromycin ointment.  This helped a little but he still has irritation and eye tearing and vague sensation of blurry vision.  He denies any injury.  He has been using some over-the-counter Naphcon eyedrops which do not help all that much.     ROS as above:  Exam:  BP (!) 136/102   Pulse 97   Temp 98.1 F (36.7 C) (Oral)   Wt 226 lb (102.5 kg)   BMI 36.48 kg/m    Wt Readings from Last 5 Encounters:  05/19/19 226 lb (102.5 kg)  04/22/19 228 lb (103.4 kg)  02/01/19 219 lb (99.3 kg)  01/28/19 233 lb (105.7 kg)  01/27/19 230 lb (104.3 kg)    Gen: Well NAD HEENT: EOMI,  MMM no significant conjunctival injection.  Pupils are round and reactive to light.  Normal eye motion.  Able to close eyelid fully. Lungs: Normal work of breathing. CTABL Heart: RRR no MRG Abd: NABS, Soft. Nondistended, Nontender Exts: Brisk capillary refill, warm and well perfused.   Lab and Radiology Results No results found for this or any previous visit (from the past 72 hour(s)). No results found.    Assessment and Plan: 68 y.o. male with left eye irritation.  Likely secondary to dry eye.  Plan to discontinue Naphcon eyedrops.  Plan to also add Systane artificial tears and also Zaditor eyedrops.  If not improving next step would be referral to ophthalmology for further evaluation and management.  Patient will let us know if not improving in a few weeks.  Precautions reviewed.  PDMP not reviewed this encounter. No orders of the defined types were placed  in this encounter.  No orders of the defined types were placed in this encounter.    Historical information moved to improve visibility of documentation.  Past Medical History:  Diagnosis Date  . CKD (chronic kidney disease) stage 3, GFR 30-59 ml/min 06/05/2018  . Cluster headache    Past Surgical History:  Procedure Laterality Date  . back lumbar  2004   fusion    Social History   Tobacco Use  . Smoking status: Former Smoker    Quit date: 07/29/2001    Years since quitting: 17.8  . Smokeless tobacco: Never Used  Substance Use Topics  . Alcohol use: No   family history includes Alcohol abuse in an other family member; Coronary artery disease in his brother; Dementia in his mother; Hypertension in his brother; Lung cancer in his father.  Medications: Current Outpatient Medications  Medication Sig Dispense Refill  . ibuprofen (ADVIL,MOTRIN) 800 MG tablet TAKE 1 TABLET BY MOUTH EVERY 8 HOURS WITH FOOD AS NEEDED 90 tablet 3  . lisinopril (ZESTRIL) 20 MG tablet Take 1 tablet (20 mg total) by mouth daily. 90 tablet 0  . omeprazole (PRILOSEC) 40 MG capsule Take 1 capsule (40 mg total) by mouth daily. 90 capsule 3  . oxybutynin (DITROPAN-XL) 10 MG 24 hr tablet Take 1 tablet (10 mg total) by mouth at bedtime. 90 tablet 3  . sildenafil (REVATIO) 20 MG tablet Take 2-5  tablets (40-100 mg total) by mouth daily as needed. 20 tablet 5   No current facility-administered medications for this visit.    Allergies  Allergen Reactions  . Prednisone     REACTION: hick ups     Discussed warning signs or symptoms. Please see discharge instructions. Patient expresses understanding.

## 2019-06-16 ENCOUNTER — Other Ambulatory Visit: Payer: Self-pay | Admitting: Family Medicine

## 2019-06-16 DIAGNOSIS — I10 Essential (primary) hypertension: Secondary | ICD-10-CM

## 2019-06-16 DIAGNOSIS — G43909 Migraine, unspecified, not intractable, without status migrainosus: Secondary | ICD-10-CM

## 2019-08-10 ENCOUNTER — Telehealth: Payer: Self-pay | Admitting: Family Medicine

## 2019-08-10 DIAGNOSIS — Z1211 Encounter for screening for malignant neoplasm of colon: Secondary | ICD-10-CM

## 2019-08-10 DIAGNOSIS — H5789 Other specified disorders of eye and adnexa: Secondary | ICD-10-CM

## 2019-08-10 NOTE — Telephone Encounter (Signed)
Spoke w/pt and he doesn't have a preference. Referrals placed.Heath Gold, CMA

## 2019-08-10 NOTE — Telephone Encounter (Signed)
K for referral for both. Let pt know

## 2019-08-10 NOTE — Telephone Encounter (Signed)
Jose Waller came in asking for a referral to an eye specialist as well as needing a colonoscopy ordered. I stated he may need an appt. He said this was an ongoing issue you were aware of and just to ask.

## 2019-08-11 ENCOUNTER — Encounter: Payer: Self-pay | Admitting: Internal Medicine

## 2019-08-11 DIAGNOSIS — H02834 Dermatochalasis of left upper eyelid: Secondary | ICD-10-CM | POA: Diagnosis not present

## 2019-08-11 DIAGNOSIS — H0100A Unspecified blepharitis right eye, upper and lower eyelids: Secondary | ICD-10-CM | POA: Diagnosis not present

## 2019-08-11 DIAGNOSIS — H0100B Unspecified blepharitis left eye, upper and lower eyelids: Secondary | ICD-10-CM | POA: Diagnosis not present

## 2019-08-11 DIAGNOSIS — H02831 Dermatochalasis of right upper eyelid: Secondary | ICD-10-CM | POA: Diagnosis not present

## 2019-08-11 DIAGNOSIS — H25813 Combined forms of age-related cataract, bilateral: Secondary | ICD-10-CM | POA: Diagnosis not present

## 2019-08-16 ENCOUNTER — Ambulatory Visit (AMBULATORY_SURGERY_CENTER): Payer: Self-pay | Admitting: *Deleted

## 2019-08-16 ENCOUNTER — Other Ambulatory Visit: Payer: Self-pay

## 2019-08-16 ENCOUNTER — Encounter: Payer: Self-pay | Admitting: Internal Medicine

## 2019-08-16 VITALS — Temp 98.4°F | Ht 66.0 in | Wt 228.8 lb

## 2019-08-16 DIAGNOSIS — Z01818 Encounter for other preprocedural examination: Secondary | ICD-10-CM

## 2019-08-16 DIAGNOSIS — Z1211 Encounter for screening for malignant neoplasm of colon: Secondary | ICD-10-CM

## 2019-08-16 NOTE — Progress Notes (Signed)
Patient denies any allergies to egg or soy products. Patient denies complications with anesthesia/sedation.  Patient denies oxygen use at home and denies diet medications. Emmi instructions for colonoscopy/endoscopy explained and given to patient.   

## 2019-08-19 ENCOUNTER — Other Ambulatory Visit: Payer: Self-pay | Admitting: Family Medicine

## 2019-08-19 DIAGNOSIS — I10 Essential (primary) hypertension: Secondary | ICD-10-CM

## 2019-08-19 DIAGNOSIS — G43909 Migraine, unspecified, not intractable, without status migrainosus: Secondary | ICD-10-CM

## 2019-08-20 NOTE — Telephone Encounter (Signed)
Must make appointment 

## 2019-08-25 ENCOUNTER — Other Ambulatory Visit: Payer: Self-pay | Admitting: Internal Medicine

## 2019-08-25 ENCOUNTER — Ambulatory Visit (INDEPENDENT_AMBULATORY_CARE_PROVIDER_SITE_OTHER): Payer: Medicare Other

## 2019-08-25 DIAGNOSIS — Z1159 Encounter for screening for other viral diseases: Secondary | ICD-10-CM

## 2019-08-25 LAB — SARS CORONAVIRUS 2 (TAT 6-24 HRS): SARS Coronavirus 2: NEGATIVE

## 2019-08-30 ENCOUNTER — Encounter: Payer: Self-pay | Admitting: Internal Medicine

## 2019-08-30 ENCOUNTER — Other Ambulatory Visit: Payer: Self-pay

## 2019-08-30 ENCOUNTER — Ambulatory Visit (AMBULATORY_SURGERY_CENTER): Payer: Medicare Other | Admitting: Internal Medicine

## 2019-08-30 VITALS — BP 125/82 | HR 87 | Temp 98.0°F | Resp 16 | Ht 66.0 in | Wt 228.8 lb

## 2019-08-30 DIAGNOSIS — Z1211 Encounter for screening for malignant neoplasm of colon: Secondary | ICD-10-CM | POA: Diagnosis not present

## 2019-08-30 DIAGNOSIS — K219 Gastro-esophageal reflux disease without esophagitis: Secondary | ICD-10-CM | POA: Diagnosis not present

## 2019-08-30 MED ORDER — SODIUM CHLORIDE 0.9 % IV SOLN: 500.0000 mL | Freq: Once | INTRAVENOUS | Status: DC

## 2019-08-30 NOTE — Progress Notes (Signed)
Report to PACU, RN, vss, BBS= Clear.  

## 2019-08-30 NOTE — Patient Instructions (Addendum)
No polyps or cancer seen. You do have diverticulosis - thickened muscle rings and pouches in the colon wall. Please read the handout about this condition.  I recommend that you be considered for another colonoscopy in about 5 years.  I appreciate the opportunity to care for you. Iva Boop, MD, [facility]  Diverticulosis handout given to patient.  Resume previous diet. Continue present medications.  Repeat colonoscopy in 5 years for screening purposes.  YOU HAD AN ENDOSCOPIC PROCEDURE TODAY AT THE [facility]:   Refer to the procedure report that was given to you for any specific questions about what was found during the examination.  If the procedure report does not answer your questions, please call your gastroenterologist to clarify.  If you requested that your care partner not be given the details of your procedure findings, then the procedure report has been included in a sealed envelope for you to review at your convenience later.  YOU SHOULD EXPECT: Some feelings of bloating in the abdomen. Passage of more gas than usual.  Walking can help get rid of the air that was put into your GI tract during the procedure and reduce the bloating. If you had a lower endoscopy (such as a colonoscopy or flexible sigmoidoscopy) you may notice spotting of blood in your stool or on the toilet paper. If you underwent a bowel prep for your procedure, you may not have a normal bowel movement for a few days.  Please Note:  You might notice some irritation and congestion in your nose or some drainage.  This is from the oxygen used during your procedure.  There is no need for concern and it should clear up in a day or so.  SYMPTOMS TO REPORT IMMEDIATELY:   Following lower endoscopy (colonoscopy or flexible sigmoidoscopy):  Excessive amounts of blood in the stool  Significant tenderness or worsening of abdominal pains  Swelling of the abdomen that is new, acute  Fever of 100F or higher  For  urgent or emergent issues, a gastroenterologist can be reached at any hour by calling [phone].   DIET:  We do recommend a small meal at first, but then you may proceed to your regular diet.  Drink plenty of fluids but you should avoid alcoholic beverages for 24 hours.  ACTIVITY:  You should plan to take it easy for the rest of today and you should NOT DRIVE or use heavy machinery until tomorrow (because of the sedation medicines used during the test).    FOLLOW UP: Our staff will call the number listed on your records 48-72 hours following your procedure to check on you and address any questions or concerns that you may have regarding the information given to you following your procedure. If we do not reach you, we will leave a message.  We will attempt to reach you two times.  During this call, we will ask if you have developed any symptoms of COVID 19. If you develop any symptoms (ie: fever, flu-like symptoms, shortness of breath, cough etc.) before then, please call [phone].  If you test positive for Covid 19 in the 2 weeks post procedure, please call and report this information to [place].    If any biopsies were taken you will be contacted by phone or by letter within the next 1-3 weeks.  Please call us at [phone] if you have not heard about the biopsies in 3 weeks.    SIGNATURES/CONFIDENTIALITY: You and/or your care partner have signed  paperwork which will be entered into your electronic medical record.  These signatures attest to the fact that that the information above on your After Visit Summary has been reviewed and is understood.  Full responsibility of the confidentiality of this discharge information lies with you and/or your care-partner.

## 2019-08-30 NOTE — Progress Notes (Signed)
Pt's states no medical or surgical changes since previsit or office visit.  Temp-lc  V/s-dt 

## 2019-08-30 NOTE — Op Note (Signed)
[facility] Patient Name: Jose Waller Procedure Date: 08/30/2019 10:55 AM MRN: [id number] Endoscopist: Iva Boop , MD Age: 69 Referring MD:  Date of Birth: 1950-12-06 Gender: Male Account #: [id number] Procedure:                Colonoscopy Indications:              Screening for colorectal malignant neoplasm, Last                            colonoscopy: 2010 Medicines:                Propofol per Anesthesia, Monitored Anesthesia Care Procedure:                Pre-Anesthesia Assessment:                           - Prior to the procedure, a History and Physical                            was performed, and patient medications and                            allergies were reviewed. The patient's tolerance of                            previous anesthesia was also reviewed. The risks                            and benefits of the procedure and the sedation                            options and risks were discussed with the patient.                            All questions were answered, and informed consent                            was obtained. Prior Anticoagulants: The patient has                            taken no previous anticoagulant or antiplatelet                            agents. ASA Grade Assessment: II - A patient with                            mild systemic disease. After reviewing the risks                            and benefits, the patient was deemed in                            satisfactory condition to undergo the procedure.  After obtaining informed consent, the colonoscope                            was passed under direct vision. Throughout the                            procedure, the patient's blood pressure, pulse, and                            oxygen saturations were monitored continuously. The                            Colonoscope was introduced through the anus and                            advanced to the the  cecum, identified by                            appendiceal orifice and ileocecal valve. The                            colonoscopy was somewhat difficult due to                            significant looping. Successful completion of the                            procedure was aided by applying abdominal pressure.                            The patient tolerated the procedure well. The                            quality of the bowel preparation was adequate. The                            ileocecal valve, appendiceal orifice, and rectum                            were photographed. The bowel preparation used was                            Miralax via split dose instruction. Scope In: 11:14:56 AM Scope Out: 11:40:20 AM Scope Withdrawal Time: 0 hours 19 minutes 7 seconds  Total Procedure Duration: 0 hours 25 minutes 24 seconds  Findings:                 The perianal and digital rectal examinations were                            normal. Pertinent negatives include normal prostate                            (size, shape, and consistency).  Multiple diverticula were found in the sigmoid                            colon.                           The exam was otherwise without abnormality on                            direct and retroflexion views. Complications:            No immediate complications. Estimated Blood Loss:     Estimated blood loss: none. Impression:               - Diverticulosis in the sigmoid colon.                           - The examination was otherwise normal on direct                            and retroflexion views.                           - No specimens collected. Recommendation:           - Patient has a contact number available for                            emergencies. The signs and symptoms of potential                            delayed complications were discussed with the                            patient. Return to normal  activities tomorrow.                            Written discharge instructions were provided to the                            patient.                           - Resume previous diet.                           - Continue present medications.                           - Repeat colonoscopy in 5 years for screening                            purposes. - consdier - today's prep wasa adequate                            but required substantial lavage Gatha Mayer, MD 08/30/2019 11:48:35 AM This report has been signed electronically.

## 2019-09-01 ENCOUNTER — Telehealth: Payer: Self-pay

## 2019-09-01 NOTE — Telephone Encounter (Signed)
  Follow up Call-  Call back number 08/30/2019  Post procedure Call Back phone  # [phone]  Permission to leave phone message Yes  Some recent data might be hidden     Patient questions:  Do you have a fever, pain , or abdominal swelling? No. Pain Score  0 *  Have you tolerated food without any problems? Yes.    Have you been able to return to your normal activities? Yes.    Do you have any questions about your discharge instructions: Diet   No. Medications  No. Follow up visit  No.  Do you have questions or concerns about your Care? No.  Actions: * If pain score is 4 or above: 1. No action needed, pain <4.Have you developed a fever since your procedure? no  2.   Have you had an respiratory symptoms (SOB or cough) since your procedure? no  3.   Have you tested positive for COVID 19 since your procedure no  4.   Have you had any family members/close contacts diagnosed with the COVID 19 since your procedure?  no   If yes to any of these questions please route to Laverna Peace, RN and Jennye Boroughs, Charity fundraiser.

## 2019-09-08 DIAGNOSIS — H25811 Combined forms of age-related cataract, right eye: Secondary | ICD-10-CM | POA: Diagnosis not present

## 2019-09-08 DIAGNOSIS — H0100B Unspecified blepharitis left eye, upper and lower eyelids: Secondary | ICD-10-CM | POA: Diagnosis not present

## 2019-09-08 DIAGNOSIS — H2589 Other age-related cataract: Secondary | ICD-10-CM | POA: Diagnosis not present

## 2019-09-08 DIAGNOSIS — H0100A Unspecified blepharitis right eye, upper and lower eyelids: Secondary | ICD-10-CM | POA: Diagnosis not present

## 2019-09-08 DIAGNOSIS — H527 Unspecified disorder of refraction: Secondary | ICD-10-CM | POA: Diagnosis not present

## 2019-09-08 DIAGNOSIS — H02831 Dermatochalasis of right upper eyelid: Secondary | ICD-10-CM | POA: Diagnosis not present

## 2019-09-20 DIAGNOSIS — Z20822 Contact with and (suspected) exposure to covid-19: Secondary | ICD-10-CM | POA: Diagnosis not present

## 2019-09-20 DIAGNOSIS — H25812 Combined forms of age-related cataract, left eye: Secondary | ICD-10-CM | POA: Diagnosis not present

## 2019-09-20 DIAGNOSIS — Z01812 Encounter for preprocedural laboratory examination: Secondary | ICD-10-CM | POA: Diagnosis not present

## 2019-09-20 DIAGNOSIS — Z01818 Encounter for other preprocedural examination: Secondary | ICD-10-CM | POA: Diagnosis not present

## 2019-09-23 DIAGNOSIS — H25813 Combined forms of age-related cataract, bilateral: Secondary | ICD-10-CM | POA: Diagnosis not present

## 2019-09-23 DIAGNOSIS — Z8249 Family history of ischemic heart disease and other diseases of the circulatory system: Secondary | ICD-10-CM | POA: Diagnosis not present

## 2019-09-23 DIAGNOSIS — I129 Hypertensive chronic kidney disease with stage 1 through stage 4 chronic kidney disease, or unspecified chronic kidney disease: Secondary | ICD-10-CM | POA: Diagnosis not present

## 2019-09-23 DIAGNOSIS — N1831 Chronic kidney disease, stage 3a: Secondary | ICD-10-CM | POA: Diagnosis not present

## 2019-09-23 DIAGNOSIS — Z79899 Other long term (current) drug therapy: Secondary | ICD-10-CM | POA: Diagnosis not present

## 2019-09-23 DIAGNOSIS — E785 Hyperlipidemia, unspecified: Secondary | ICD-10-CM | POA: Diagnosis not present

## 2019-09-23 DIAGNOSIS — M1990 Unspecified osteoarthritis, unspecified site: Secondary | ICD-10-CM | POA: Diagnosis not present

## 2019-09-23 DIAGNOSIS — H25812 Combined forms of age-related cataract, left eye: Secondary | ICD-10-CM

## 2019-09-23 DIAGNOSIS — K219 Gastro-esophageal reflux disease without esophagitis: Secondary | ICD-10-CM | POA: Diagnosis not present

## 2019-09-23 HISTORY — DX: Combined forms of age-related cataract, left eye: H25.812

## 2019-09-24 DIAGNOSIS — H0100B Unspecified blepharitis left eye, upper and lower eyelids: Secondary | ICD-10-CM | POA: Diagnosis not present

## 2019-09-24 DIAGNOSIS — H0100A Unspecified blepharitis right eye, upper and lower eyelids: Secondary | ICD-10-CM | POA: Diagnosis not present

## 2019-09-24 DIAGNOSIS — H25811 Combined forms of age-related cataract, right eye: Secondary | ICD-10-CM | POA: Diagnosis not present

## 2019-09-24 DIAGNOSIS — H02831 Dermatochalasis of right upper eyelid: Secondary | ICD-10-CM | POA: Diagnosis not present

## 2019-09-24 DIAGNOSIS — Z961 Presence of intraocular lens: Secondary | ICD-10-CM | POA: Diagnosis not present

## 2019-09-27 DIAGNOSIS — Z01812 Encounter for preprocedural laboratory examination: Secondary | ICD-10-CM | POA: Diagnosis not present

## 2019-09-27 DIAGNOSIS — Z01818 Encounter for other preprocedural examination: Secondary | ICD-10-CM | POA: Diagnosis not present

## 2019-09-27 DIAGNOSIS — H25811 Combined forms of age-related cataract, right eye: Secondary | ICD-10-CM | POA: Diagnosis not present

## 2019-09-27 DIAGNOSIS — Z20822 Contact with and (suspected) exposure to covid-19: Secondary | ICD-10-CM | POA: Diagnosis not present

## 2019-09-30 DIAGNOSIS — Z79899 Other long term (current) drug therapy: Secondary | ICD-10-CM | POA: Diagnosis not present

## 2019-09-30 DIAGNOSIS — M1990 Unspecified osteoarthritis, unspecified site: Secondary | ICD-10-CM | POA: Diagnosis not present

## 2019-09-30 DIAGNOSIS — I129 Hypertensive chronic kidney disease with stage 1 through stage 4 chronic kidney disease, or unspecified chronic kidney disease: Secondary | ICD-10-CM | POA: Diagnosis not present

## 2019-09-30 DIAGNOSIS — Z888 Allergy status to other drugs, medicaments and biological substances status: Secondary | ICD-10-CM | POA: Diagnosis not present

## 2019-09-30 DIAGNOSIS — E785 Hyperlipidemia, unspecified: Secondary | ICD-10-CM | POA: Diagnosis not present

## 2019-09-30 DIAGNOSIS — K219 Gastro-esophageal reflux disease without esophagitis: Secondary | ICD-10-CM | POA: Diagnosis not present

## 2019-09-30 DIAGNOSIS — H25813 Combined forms of age-related cataract, bilateral: Secondary | ICD-10-CM | POA: Diagnosis not present

## 2019-09-30 DIAGNOSIS — Z8249 Family history of ischemic heart disease and other diseases of the circulatory system: Secondary | ICD-10-CM | POA: Diagnosis not present

## 2019-09-30 DIAGNOSIS — H25811 Combined forms of age-related cataract, right eye: Secondary | ICD-10-CM | POA: Diagnosis not present

## 2019-09-30 DIAGNOSIS — N1831 Chronic kidney disease, stage 3a: Secondary | ICD-10-CM | POA: Diagnosis not present

## 2019-10-01 DIAGNOSIS — H0100B Unspecified blepharitis left eye, upper and lower eyelids: Secondary | ICD-10-CM | POA: Diagnosis not present

## 2019-10-01 DIAGNOSIS — Z961 Presence of intraocular lens: Secondary | ICD-10-CM | POA: Diagnosis not present

## 2019-10-01 DIAGNOSIS — H02834 Dermatochalasis of left upper eyelid: Secondary | ICD-10-CM | POA: Diagnosis not present

## 2019-10-01 DIAGNOSIS — H0100A Unspecified blepharitis right eye, upper and lower eyelids: Secondary | ICD-10-CM | POA: Diagnosis not present

## 2019-10-01 DIAGNOSIS — H02831 Dermatochalasis of right upper eyelid: Secondary | ICD-10-CM | POA: Diagnosis not present

## 2019-10-14 ENCOUNTER — Other Ambulatory Visit: Payer: Self-pay | Admitting: Family Medicine

## 2019-10-14 DIAGNOSIS — G43909 Migraine, unspecified, not intractable, without status migrainosus: Secondary | ICD-10-CM

## 2019-10-20 ENCOUNTER — Other Ambulatory Visit: Payer: Self-pay | Admitting: *Deleted

## 2019-10-20 DIAGNOSIS — I10 Essential (primary) hypertension: Secondary | ICD-10-CM

## 2019-10-20 MED ORDER — LISINOPRIL 20 MG PO TABS: 20.0000 mg | ORAL_TABLET | Freq: Every day | ORAL | 0 refills | Status: DC

## 2019-10-26 DIAGNOSIS — Z961 Presence of intraocular lens: Secondary | ICD-10-CM | POA: Diagnosis not present

## 2019-11-22 ENCOUNTER — Telehealth: Payer: Medicare Other | Admitting: Medical-Surgical

## 2019-11-29 ENCOUNTER — Telehealth (INDEPENDENT_AMBULATORY_CARE_PROVIDER_SITE_OTHER): Payer: Medicare Other | Admitting: Family Medicine

## 2019-11-29 ENCOUNTER — Encounter: Payer: Self-pay | Admitting: Family Medicine

## 2019-11-29 DIAGNOSIS — R5383 Other fatigue: Secondary | ICD-10-CM | POA: Diagnosis not present

## 2019-11-29 NOTE — Progress Notes (Signed)
Spoke w/pt. He denies any SOB. He stated that he has been getting really tired quick for the past 1.5 months. He stated that in this beginning he was sleepy a lot but now he doesn't feel that way any more. He denies any pain, no loss in appetite, no blood in stool or urine, no abdominal/chest or back pain.   He did inform me that he does get "strangled" on occasion  when he lays back. He has not been around anyone who has been Dx w/COVID.   He is unable to obtain any of his vital signs.

## 2019-11-29 NOTE — Progress Notes (Signed)
Virtual Visit via Video Note  I connected with Jose Waller on 11/29/19 at  2:20 PM EDT by a video enabled telemedicine application and verified that I am speaking with the correct person using two identifiers.   I discussed the limitations of evaluation and management by telemedicine and the availability of in person appointments. The patient expressed understanding and agreed to proceed.  Subjective:    CC: Fatigue x 2 months.   HPI: Spoke w/pt. He denies any SOB. He stated that he has been getting really tired quick for the past 1.5 months. He stated that in this beginning he was sleepy a lot but now he doesn't feel that way any more. He denies any pain, no loss in appetite, no blood in stool or urine, no abdominal/chest or back pain.  He also reports he has had a significantly decreased appetite.  He does not have any abdominal pain or heartburn or reflux symptoms no blood in the stool or urine.  No unusual rashes.  No fevers or chills.  When I asked about snoring he said that he has been told that he snores before but he is really not sure.  He denies any recent medication changes or starting any new medications he said he did receive a injection in his joint at the pain clinic about a year ago.  His daughter also recently moved out as she joined the [employer].  He says he wakes up feeling tired and as soon as he starts to do something he just feels like he has to sit down and rest because he feels tired.  He denies feeling weak or lightheaded or short of breath.  Denies any chest pain or palpitations.  Reports that his bowels are moving normally.  He did inform me that he does get "strangled" on occasion  when he lays back. He has not been around anyone who has been Dx w/COVID.   He is unable to obtain any of his vital signs.     Past medical history, Surgical history, Family history not pertinant except as noted below, Social history, Allergies, and medications have been entered into the  medical record, reviewed, and corrections made.   Review of Systems: No fevers, chills, night sweats, weight loss, chest pain, or shortness of breath.   Objective:    General: Speaking clearly in complete sentences without any shortness of breath.  Alert and oriented x3.  Normal judgment. No apparent acute distress.    Impression and Recommendations:    No problem-specific Assessment & Plan notes found for this encounter.  Fatigue -unclear etiology has no other concomitant symptoms that he is able to pinpoint except for the fatigue and decreased appetite again no pain chest pain shortness of breath etc.  We will start with some blood work some of it just needs to be updated because it is been a years just this PSA and CMP.  But will check thyroid, check for anemia check for electrolyte imbalance, will check for kidney and liver dysfunction.  Okay so consider cardiac causes.  We will get an EKG here in the office and consider scheduling for echocardiogram.    Time spent in encounter 25 minutes  I discussed the assessment and treatment plan with the patient. The patient was provided an opportunity to ask questions and all were answered. The patient agreed with the plan and demonstrated an understanding of the instructions.   The patient was advised to call back or seek an in-person evaluation  if the symptoms worsen or if the condition fails to improve as anticipated.   Beatrice Lecher, MD

## 2019-11-30 DIAGNOSIS — R7309 Other abnormal glucose: Secondary | ICD-10-CM | POA: Diagnosis not present

## 2019-11-30 DIAGNOSIS — R5383 Other fatigue: Secondary | ICD-10-CM | POA: Diagnosis not present

## 2019-11-30 DIAGNOSIS — E785 Hyperlipidemia, unspecified: Secondary | ICD-10-CM | POA: Diagnosis not present

## 2019-12-01 LAB — CBC
HCT: 45 % (ref 38.5–50.0)
Hemoglobin: 15.4 g/dL (ref 13.2–17.1)
MCH: 28.9 pg (ref 27.0–33.0)
MCHC: 34.2 g/dL (ref 32.0–36.0)
MCV: 84.4 fL (ref 80.0–100.0)
MPV: 10.6 fL (ref 7.5–12.5)
Platelets: 356 10*3/uL (ref 140–400)
RBC: 5.33 10*6/uL (ref 4.20–5.80)
RDW: 13.2 % (ref 11.0–15.0)
WBC: 7.6 10*3/uL (ref 3.8–10.8)

## 2019-12-01 LAB — URINALYSIS, ROUTINE W REFLEX MICROSCOPIC
Bacteria, UA: NONE SEEN /HPF
Bilirubin Urine: NEGATIVE
Glucose, UA: NEGATIVE
Hgb urine dipstick: NEGATIVE
Hyaline Cast: NONE SEEN /LPF
Ketones, ur: NEGATIVE
Nitrite: NEGATIVE
Specific Gravity, Urine: 1.03 (ref 1.001–1.03)
Squamous Epithelial / HPF: NONE SEEN /HPF (ref <=5)
pH: <=5 (ref 5.0–8.0)

## 2019-12-01 LAB — HEMOGLOBIN A1C
Hgb A1c MFr Bld: 5.7 % of total Hgb — ABNORMAL HIGH (ref <5.7)
Mean Plasma Glucose: 117 (calc)
eAG (mmol/L): 6.5 (calc)

## 2019-12-01 LAB — COMPLETE METABOLIC PANEL WITH GFR
AG Ratio: 1.6 (calc) (ref 1.0–2.5)
ALT: 9 U/L (ref 9–46)
AST: 14 U/L (ref 10–35)
Albumin: 4.2 g/dL (ref 3.6–5.1)
Alkaline phosphatase (APISO): 69 U/L (ref 35–144)
BUN/Creatinine Ratio: 14 (calc) (ref 6–22)
BUN: 17 mg/dL (ref 7–25)
CO2: 25 mmol/L (ref 20–32)
Calcium: 9.5 mg/dL (ref 8.6–10.3)
Chloride: 106 mmol/L (ref 98–110)
Creat: 1.23 mg/dL — ABNORMAL HIGH (ref 0.70–1.18)
GFR, Est African American: 69 mL/min/{1.73_m2} (ref >=60)
GFR, Est Non African American: 59 mL/min/{1.73_m2} — ABNORMAL LOW (ref >=60)
Globulin: 2.7 g/dL (calc) (ref 1.9–3.7)
Glucose, Bld: 97 mg/dL (ref 65–99)
Potassium: 4.1 mmol/L (ref 3.5–5.3)
Sodium: 140 mmol/L (ref 135–146)
Total Bilirubin: 0.4 mg/dL (ref 0.2–1.2)
Total Protein: 6.9 g/dL (ref 6.1–8.1)

## 2019-12-01 LAB — LIPID PANEL W/REFLEX DIRECT LDL
Cholesterol: 193 mg/dL (ref <200)
HDL: 33 mg/dL — ABNORMAL LOW (ref >=40)
LDL Cholesterol (Calc): 140 mg/dL (calc) — ABNORMAL HIGH
Non-HDL Cholesterol (Calc): 160 mg/dL (calc) — ABNORMAL HIGH (ref <130)
Total CHOL/HDL Ratio: 5.8 (calc) — ABNORMAL HIGH (ref <5.0)
Triglycerides: 90 mg/dL (ref <150)

## 2019-12-01 LAB — TSH: TSH: 0.8 mIU/L (ref 0.40–4.50)

## 2019-12-01 LAB — PSA: PSA: 1.5 ng/mL (ref <=4.0)

## 2019-12-01 MED ORDER — ATORVASTATIN CALCIUM 20 MG PO TABS
20.0000 mg | ORAL_TABLET | Freq: Every day | ORAL | 3 refills | Status: DC
Start: 2019-12-01 — End: 2021-04-03

## 2019-12-01 NOTE — Addendum Note (Signed)
Addended by: Nani Gasser D on: 12/01/2019 03:55 PM   Modules accepted: Orders

## 2019-12-24 ENCOUNTER — Other Ambulatory Visit: Payer: Self-pay | Admitting: Family Medicine

## 2019-12-24 DIAGNOSIS — G43909 Migraine, unspecified, not intractable, without status migrainosus: Secondary | ICD-10-CM

## 2020-01-12 ENCOUNTER — Telehealth: Payer: Self-pay

## 2020-01-12 ENCOUNTER — Ambulatory Visit (INDEPENDENT_AMBULATORY_CARE_PROVIDER_SITE_OTHER): Payer: Medicare Other

## 2020-01-12 ENCOUNTER — Ambulatory Visit (INDEPENDENT_AMBULATORY_CARE_PROVIDER_SITE_OTHER): Payer: Medicare Other | Admitting: Family Medicine

## 2020-01-12 ENCOUNTER — Encounter: Payer: Self-pay | Admitting: Family Medicine

## 2020-01-12 ENCOUNTER — Other Ambulatory Visit: Payer: Self-pay

## 2020-01-12 VITALS — BP 137/99 | HR 94 | Ht 66.0 in | Wt 209.0 lb

## 2020-01-12 DIAGNOSIS — G8929 Other chronic pain: Secondary | ICD-10-CM | POA: Diagnosis not present

## 2020-01-12 DIAGNOSIS — M1711 Unilateral primary osteoarthritis, right knee: Secondary | ICD-10-CM | POA: Diagnosis not present

## 2020-01-12 DIAGNOSIS — R634 Abnormal weight loss: Secondary | ICD-10-CM | POA: Diagnosis not present

## 2020-01-12 DIAGNOSIS — M25561 Pain in right knee: Secondary | ICD-10-CM

## 2020-01-12 DIAGNOSIS — R5383 Other fatigue: Secondary | ICD-10-CM | POA: Diagnosis not present

## 2020-01-12 DIAGNOSIS — R630 Anorexia: Secondary | ICD-10-CM

## 2020-01-12 DIAGNOSIS — M1712 Unilateral primary osteoarthritis, left knee: Secondary | ICD-10-CM | POA: Diagnosis not present

## 2020-01-12 DIAGNOSIS — M25562 Pain in left knee: Secondary | ICD-10-CM

## 2020-01-12 MED ORDER — PAROXETINE HCL 10 MG PO TABS
10.0000 mg | ORAL_TABLET | Freq: Every day | ORAL | 1 refills | Status: DC
Start: 2020-01-12 — End: 2020-02-09

## 2020-01-12 MED ORDER — MELOXICAM 7.5 MG PO TABS: 7.5000 mg | ORAL_TABLET | Freq: Every day | ORAL | 0 refills | Status: DC

## 2020-01-12 NOTE — Progress Notes (Signed)
He reports that he has felt bad for the past 2-3 months. Denies loss of appetite,abdominal pain.sob or CP. He has noticed a change in his BM's in the past 2 wks. He has started drinking ensure for the past week

## 2020-01-12 NOTE — Telephone Encounter (Signed)
Pt informed.  Pt expressed understanding.  T. Penny Frisbie, CMA 

## 2020-01-12 NOTE — Telephone Encounter (Signed)
Yes, paxil t help with mood and appetitie

## 2020-01-12 NOTE — Telephone Encounter (Signed)
Mr. Jose Waller called and left a message that he was expecting an RX for his appetite. I saw that you had started him on paroxetine today. Is that what you prescribed for his appetite or was he supposed to get something else?

## 2020-01-12 NOTE — Progress Notes (Signed)
Established Patient Office Visit  Subjective:  Patient ID: Jose Waller, male    DOB: [birth year]  Age: 69 y.o. MRN: [id number]  CC:  Chief Complaint  Patient presents with   Follow-up    HPI Jose Waller presents for fatigue.  Seen for video visit back in May.  He has dec appetite and has lost about 20 lbs.  No normal BM in the last 2 weeks. Has been drinking ensure for a couple of weeks.  He did some lab work including a metabolic panel, CBC, hemoglobin A1c, PSA, TSH and lipid panel as well as urine.  Nothing specific to explain his fatigue and or weight loss. He denis any abdominal pain, GERD, blood in stool or urine.  His daughter moved out to joint the [employer] and he is worried about his other daughter who is pregnancy.    Note when he lays back he feels like he gets strangled.  Reports his SOB is better.    Occ has some bilateral knee pain. Says they feel stiff when he gets up  In the mornings.  No pain in back or hips, etc. No  Swelling of the joints. Doesn't take anything got it.  He did let me know he has been in a research study for the last 6 months out of [place] for his knees and has been receiving injections.  Pain in is knees is mostly on the outside.   He is thinking about joining Chief of Staff.    Past Medical History:  Diagnosis Date   Allergy    Arthritis    Cataract    MD just watching   CKD (chronic kidney disease) stage 3, GFR 30-59 ml/min 06/05/2018   Cluster headache    resolved   Glaucoma    Hypertension    Substance abuse ([place])    cocaine - quit 1998    Past Surgical History:  Procedure Laterality Date   back lumbar  2004   fusion    COLONOSCOPY  06/09/2009   Dr Juleen Starr - normal   WISDOM TOOTH EXTRACTION      Family History  Problem Relation Age of Onset   Lung cancer Father        lung/ was a miner   Alcohol abuse Other    Hypertension Brother    Dementia Mother    Coronary artery disease Brother    Colon cancer Neg Hx     Stomach cancer Neg Hx    Rectal cancer Neg Hx     Social History   Socioeconomic History   Marital status: Widowed    Spouse name: Not on file   Number of children: 2   Years of education: 56   Highest education level: 11th grade  Occupational History   Occupation: retired    Comment: Government social research officer  Tobacco Use   Smoking status: Former Smoker    Quit date: 07/29/2001    Years since quitting: 18.4   Smokeless tobacco: Never Used  Scientific laboratory technician Use: Never used  Substance and Sexual Activity   Alcohol use: No   Drug use: Not Currently    Comment: Former cocaine user, quit 1998   Sexual activity: Yes    Comment: married, 2 kids, on disability, regularly exercises.  Other Topics Concern   Not on file  Social History Narrative   On disability.  Some exercise. Wife passed away in a motor vehicle accident. He is raising 2 daughters by  himself. Former drug addict. Likes to stay busy. Production designer, theatre/television/film so travels looking for [employer]   Social Determinants of Health   Financial Resource Strain:    Difficulty of Paying Living Expenses:   Food Insecurity:    Worried About Programme researcher, broadcasting/film/video in the Last Year:    Barista in the Last Year:   Transportation Needs:    Freight forwarder (Medical):    Lack of Transportation (Non-Medical):   Physical Activity:    Days of Exercise per Week:    Minutes of Exercise per Session:   Stress:    Feeling of Stress :   Social Connections:    Frequency of Communication with Friends and Family:    Frequency of Social Gatherings with Friends and Family:    Attends Religious Services:    Active Member of Clubs or Organizations:    Attends Engineer, structural:    Marital Status:   Intimate Partner Violence:    Fear of Current or Ex-Partner:    Emotionally Abused:    Physically Abused:    Sexually Abused:     Outpatient Medications Prior to Visit  Medication Sig Dispense  Refill   atorvastatin (LIPITOR) 20 MG tablet Take 1 tablet (20 mg total) by mouth at bedtime. 90 tablet 3   ibuprofen (ADVIL) 800 MG tablet TAKE 1 TABLET BY MOUTH EVERY 8 HOURS WITH FOOD AS NEEDED 90 tablet 0   lisinopril (ZESTRIL) 20 MG tablet Take 1 tablet (20 mg total) by mouth daily. LAST REFILL.YOU MUST MAKE AND KEEP APPOINTMENT FOR ADDITIONAL REFILLS. 90 tablet 0   omeprazole (PRILOSEC) 40 MG capsule Take 1 capsule (40 mg total) by mouth daily. 90 capsule 3   oxybutynin (DITROPAN-XL) 10 MG 24 hr tablet Take 1 tablet (10 mg total) by mouth at bedtime. 90 tablet 3   sildenafil (REVATIO) 20 MG tablet Take 2-5 tablets (40-100 mg total) by mouth daily as needed. 20 tablet 5   No facility-administered medications prior to visit.    Allergies  Allergen Reactions   Prednisone     REACTION: hick ups    ROS Review of Systems    Objective:    Physical Exam Vitals reviewed.  Constitutional:      Appearance: He is well-developed.  HENT:     Head: Normocephalic and atraumatic.  Eyes:     Conjunctiva/sclera: Conjunctivae normal.  Cardiovascular:     Rate and Rhythm: Normal rate.  Pulmonary:     Effort: Pulmonary effort is normal.  Musculoskeletal:     Comments: Tender along the medial and lateral joint lines bilat. no increased laxity with anterior drawer.  He does have some popping of the lateral tendon on both knees.  No crepitus behind the patella.  Nontender along the patellar tendon.  Strength is 5 out of 5 at the hip knee and ankle.  Skin:    General: Skin is dry.     Coloration: Skin is not pale.  Neurological:     Mental Status: He is alert and oriented to person, place, and time.  Psychiatric:        Behavior: Behavior normal.     BP (!) 137/99    Pulse 94    Ht 5\' 6"  (1.676 m)    Wt 209 lb (94.8 kg)    SpO2 99%    BMI 33.73 kg/m  Wt Readings from Last 3 Encounters:  01/12/20 209 lb (94.8 kg)  08/30/19 228 lb 12.8 oz (  103.8 kg)  08/16/19 228 lb 12.8 oz  (103.8 kg)     There are no preventive care reminders to display for this patient.  There are no preventive care reminders to display for this patient.  Lab Results  Component Value Date   TSH 0.80 11/30/2019   Lab Results  Component Value Date   WBC 7.6 11/30/2019   HGB 15.4 11/30/2019   HCT 45.0 11/30/2019   MCV 84.4 11/30/2019   PLT 356 11/30/2019   Lab Results  Component Value Date   NA 140 11/30/2019   K 4.1 11/30/2019   CO2 25 11/30/2019   GLUCOSE 97 11/30/2019   BUN 17 11/30/2019   CREATININE 1.23 (H) 11/30/2019   BILITOT 0.4 11/30/2019   ALKPHOS 71 05/15/2016   AST 14 11/30/2019   ALT 9 11/30/2019   PROT 6.9 11/30/2019   ALBUMIN 4.1 05/15/2016   CALCIUM 9.5 11/30/2019   Lab Results  Component Value Date   CHOL 193 11/30/2019   Lab Results  Component Value Date   HDL 33 (L) 11/30/2019   Lab Results  Component Value Date   LDLCALC 140 (H) 11/30/2019   Lab Results  Component Value Date   TRIG 90 11/30/2019   Lab Results  Component Value Date   CHOLHDL 5.8 (H) 11/30/2019   Lab Results  Component Value Date   HGBA1C 5.7 (H) 11/30/2019      Assessment & Plan:   Problem List Items Addressed This Visit    None    Visit Diagnoses    Chronic pain of both knees    -  Primary   Relevant Medications   PARoxetine (PAXIL) 10 MG tablet   meloxicam (MOBIC) 7.5 MG tablet   Other Relevant Orders   DG Knee Complete 4 Views Right (Completed)   DG Knee Complete 4 Views Left (Completed)   Fatigue, unspecified type       Abnormal weight loss       Anorexia          Bilat knee pain - likely OA. tiral of NSAID and will get xrays.   Fatigue - likely related to depression. Labs were normal and cancer screenings are up to date.  We discussed trial of medication for 1 mo for mood and dec appetitie  Weight loss secondary to poor PO intake.  No appetitie.  See note above. Will start Paxil. F/U  In 1 mo. WEight check in 1 mo.    Meds ordered this  encounter  Medications   PARoxetine (PAXIL) 10 MG tablet    Sig: Take 1 tablet (10 mg total) by mouth daily.    Dispense:  30 tablet    Refill:  1   meloxicam (MOBIC) 7.5 MG tablet    Sig: Take 1 tablet (7.5 mg total) by mouth daily.    Dispense:  30 tablet    Refill:  0    Follow-up: Return in about 4 weeks (around 02/09/2020) for New start medication.    Nani Gasser, MD

## 2020-01-17 ENCOUNTER — Telehealth: Payer: Self-pay

## 2020-01-17 NOTE — Telephone Encounter (Signed)
Patient advised as other note stated.

## 2020-01-17 NOTE — Telephone Encounter (Signed)
Jose Waller states the prescription, Paxil, was started on Thursday. He states this has not helped his appetite. He has been drinking Ensure daily. I did tell him it may take a few more days to start working.    Also he wanted to know if Dr Linford Arnold received the letter for his daughter.

## 2020-01-17 NOTE — Telephone Encounter (Signed)
See phone note from 6/16 

## 2020-01-17 NOTE — Telephone Encounter (Signed)
Weslie called and states the pharmacy didn't receive the appetite medication.

## 2020-01-17 NOTE — Telephone Encounter (Signed)
Yes, it is slow to act.  After 10 days on the medication he can go up to 2 tabs daily.  But he does need to give it a good 2 to 3 weeks to really kick in and see if it is helping.

## 2020-01-18 NOTE — Telephone Encounter (Signed)
Pt informed.  Pt expressed understanding and is agreeable.  T. Jessicaann Overbaugh, CMA  

## 2020-01-19 ENCOUNTER — Telehealth: Payer: Self-pay | Admitting: *Deleted

## 2020-01-19 NOTE — Telephone Encounter (Signed)
Called pt and advised him that the letter that he requested for a medical statement to give to his daughter's reassignment or deferment could not be written for him due to him not having a condition that would be qualifying for this. He voiced understanding. No questions at this time.

## 2020-02-08 ENCOUNTER — Other Ambulatory Visit: Payer: Self-pay | Admitting: Family Medicine

## 2020-02-08 DIAGNOSIS — G8929 Other chronic pain: Secondary | ICD-10-CM

## 2020-02-09 ENCOUNTER — Ambulatory Visit (INDEPENDENT_AMBULATORY_CARE_PROVIDER_SITE_OTHER): Payer: Medicare Other | Admitting: Family Medicine

## 2020-02-09 ENCOUNTER — Encounter: Payer: Self-pay | Admitting: Family Medicine

## 2020-02-09 VITALS — BP 126/89 | HR 96 | Ht 66.0 in | Wt 208.0 lb

## 2020-02-09 DIAGNOSIS — R630 Anorexia: Secondary | ICD-10-CM

## 2020-02-09 DIAGNOSIS — G8929 Other chronic pain: Secondary | ICD-10-CM

## 2020-02-09 DIAGNOSIS — R634 Abnormal weight loss: Secondary | ICD-10-CM | POA: Diagnosis not present

## 2020-02-09 DIAGNOSIS — I10 Essential (primary) hypertension: Secondary | ICD-10-CM

## 2020-02-09 DIAGNOSIS — M25561 Pain in right knee: Secondary | ICD-10-CM | POA: Diagnosis not present

## 2020-02-09 DIAGNOSIS — M25562 Pain in left knee: Secondary | ICD-10-CM

## 2020-02-09 DIAGNOSIS — E785 Hyperlipidemia, unspecified: Secondary | ICD-10-CM | POA: Diagnosis not present

## 2020-02-09 HISTORY — DX: Anorexia: R63.0

## 2020-02-09 MED ORDER — LISINOPRIL 20 MG PO TABS: 20.0000 mg | ORAL_TABLET | Freq: Every day | ORAL | 1 refills | Status: DC

## 2020-02-09 MED ORDER — MELOXICAM 7.5 MG PO TABS: 7.5000 mg | ORAL_TABLET | Freq: Every day | ORAL | 0 refills | Status: DC | PRN

## 2020-02-09 MED ORDER — PAROXETINE HCL 20 MG PO TABS
20.0000 mg | ORAL_TABLET | Freq: Every day | ORAL | 2 refills | Status: DC
Start: 2020-02-09 — End: 2020-10-20

## 2020-02-09 NOTE — Progress Notes (Signed)
Pt reports that he doesn't feel that the medication is working.  "I have good days and bad days." he stated that he has to make himself eat and that once he eats he's ok but he doesn't feel any better but he knows that he's supposed to eat.  I asked what has he been eating and he said that he eats a lot of fast food.

## 2020-02-09 NOTE — Assessment & Plan Note (Signed)
Pressure looks phenomenal today continue current regimen.  Refill sent to pharmacy.

## 2020-02-09 NOTE — Assessment & Plan Note (Signed)
He does feel like his appetite has gotten a little bit better.  Especially since a lot of the stressors that he was concerned about since he was here last time have actually improved.  Just really encouraged him to work on being consistent and not skip meals.  Also encouraged him to make sure that he is eating healthy food choices and not just fast food.

## 2020-02-09 NOTE — Assessment & Plan Note (Signed)
Only lost 1 pound since he was last year which I actually think is great progress.  We discussed options.  I had like to actually increase his Paxil I do feel like it actually is helping him so would like to increase to 20 mg and then have him follow-up in 6 to 8 weeks.  Hopefully we can just use this to medication temporarily and once he feels like he is back on track we can discontinue it.

## 2020-02-09 NOTE — Assessment & Plan Note (Signed)
Tolerating statin well thus far.  We will need to recheck liver and lipids at his follow-up visit.

## 2020-02-09 NOTE — Progress Notes (Signed)
Established Patient Office Visit  Subjective:  Patient ID: Jose Waller, male    DOB: 10/27/1950  Age: 69 y.o. MRN: [id number]  CC:  Chief Complaint  Patient presents with  . Follow-up    HPI Jose Waller presents for Pt reports that he doesn't feel that the medication is working.  "I have good days and bad days." he stated that he has to make himself eat and that once he eats he's ok but he doesn't feel any better but he knows that he's supposed to eat.  I asked what has he been eating and he said that he eats a lot of fast food.   He was excited to hear today that he had only lost 1 pound.  He says he would like to be a little bit lighter than to a but does not want to lose it because of decreased appetite.  He feels like in regards to his mood that things are a lot better than they were.  One of his daughters actually had a baby recently he had been very worried about her health and the baby's health and everyone did well in the grand baby is healthy and has been very excited about that.  His other daughter who is in the [employer] is currently stationed in [place].  But he did get to see her for a brief time so that really helped him feel better.   Past Medical History:  Diagnosis Date  . Allergy   . Arthritis   . Cataract    MD just watching  . CKD (chronic kidney disease) stage 3, GFR 30-59 ml/min 06/05/2018  . Cluster headache    resolved  . Glaucoma   . Hypertension   . Substance abuse (HCC)    cocaine - quit 1998      Social History   Socioeconomic History  . Marital status: Widowed    Spouse name: Not on file  . Number of children: 2  . Years of education: 60  . Highest education level: 11th grade  Occupational History  . Occupation: retired    Comment: Technical sales engineer  Tobacco Use  . Smoking status: Former Smoker    Quit date: 07/29/2001    Years since quitting: 18.5  . Smokeless tobacco: Never Used  Vaping Use  . Vaping Use: Never used   Substance and Sexual Activity  . Alcohol use: No  . Drug use: Not Currently    Comment: Former cocaine user, quit 1998  . Sexual activity: Yes    Comment: married, 2 kids, on disability, regularly exercises.  Other Topics Concern  . Not on file  Social History Narrative   On disability.  Some exercise. Wife passed away in a motor vehicle accident. He is raising 2 daughters by himself. Former drug addict. Likes to stay busy. 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 Difficulty of Paying Living Expenses:   Food Insecurity:   . Worried About Programme researcher, broadcasting/film/video in the Last Year:   . Barista in the Last Year:   Transportation Needs:   . Freight forwarder (Medical):   Marland Kitchen Lack of Transportation (Non-Medical):   Physical Activity:   . Days of Exercise per Week:   . Minutes of Exercise per Session:   Stress:   . Feeling of Stress :   Social Connections:   . Frequency of  Communication with Friends and Family:   . Frequency of Social Gatherings with Friends and Family:   . Attends Religious Services:   . Active Member of Clubs or Organizations:   . Attends Banker Meetings:   Marland Kitchen Marital Status:   Intimate Partner Violence:   . Fear of Current or Ex-Partner:   . Emotionally Abused:   Marland Kitchen Physically Abused:   . Sexually Abused:     Outpatient Medications Prior to Visit  Medication Sig Dispense Refill  . atorvastatin (LIPITOR) 20 MG tablet Take 1 tablet (20 mg total) by mouth at bedtime. 90 tablet 3  . ibuprofen (ADVIL) 800 MG tablet TAKE 1 TABLET BY MOUTH EVERY 8 HOURS WITH FOOD AS NEEDED 90 tablet 0  . omeprazole (PRILOSEC) 40 MG capsule Take 1 capsule (40 mg total) by mouth daily. 90 capsule 3  . oxybutynin (DITROPAN-XL) 10 MG 24 hr tablet Take 1 tablet (10 mg total) by mouth at bedtime. 90 tablet 3  . sildenafil (REVATIO) 20 MG tablet Take 2-5 tablets (40-100 mg total) by mouth daily as  needed. 20 tablet 5  . lisinopril (ZESTRIL) 20 MG tablet Take 1 tablet (20 mg total) by mouth daily. LAST REFILL.YOU MUST MAKE AND KEEP APPOINTMENT FOR ADDITIONAL REFILLS. 90 tablet 0  . PARoxetine (PAXIL) 10 MG tablet Take 1 tablet (10 mg total) by mouth daily. 30 tablet 1  . meloxicam (MOBIC) 7.5 MG tablet Take 1 tablet (7.5 mg total) by mouth daily. 30 tablet 0   No facility-administered medications prior to visit.    Allergies  Allergen Reactions  . Prednisone     REACTION: hick ups    ROS Review of Systems    Objective:    Physical Exam Constitutional:      Appearance: He is well-developed.  HENT:     Head: Normocephalic and atraumatic.  Cardiovascular:     Rate and Rhythm: Normal rate and regular rhythm.     Heart sounds: Normal heart sounds.  Pulmonary:     Effort: Pulmonary effort is normal.     Breath sounds: Normal breath sounds.  Skin:    General: Skin is warm and dry.  Neurological:     Mental Status: He is alert and oriented to person, place, and time.  Psychiatric:        Behavior: Behavior normal.     BP 126/89   Pulse 96   Ht 5\' 6"  (1.676 m)   Wt 208 lb (94.3 kg)   SpO2 98%   BMI 33.57 kg/m  Wt Readings from Last 3 Encounters:  02/09/20 208 lb (94.3 kg)  01/12/20 209 lb (94.8 kg)  08/30/19 228 lb 12.8 oz (103.8 kg)     There are no preventive care reminders to display for this patient.  There are no preventive care reminders to display for this patient.  Lab Results  Component Value Date   TSH 0.80 11/30/2019   Lab Results  Component Value Date   WBC 7.6 11/30/2019   HGB 15.4 11/30/2019   HCT 45.0 11/30/2019   MCV 84.4 11/30/2019   PLT 356 11/30/2019   Lab Results  Component Value Date   NA 140 11/30/2019   K 4.1 11/30/2019   CO2 25 11/30/2019   GLUCOSE 97 11/30/2019   BUN 17 11/30/2019   CREATININE 1.23 (H) 11/30/2019   BILITOT 0.4 11/30/2019   ALKPHOS 71 05/15/2016   AST 14 11/30/2019   ALT 9 11/30/2019   PROT 6.9  11/30/2019  ALBUMIN 4.1 05/15/2016   CALCIUM 9.5 11/30/2019   Lab Results  Component Value Date   CHOL 193 11/30/2019   Lab Results  Component Value Date   HDL 33 (L) 11/30/2019   Lab Results  Component Value Date   LDLCALC 140 (H) 11/30/2019   Lab Results  Component Value Date   TRIG 90 11/30/2019   Lab Results  Component Value Date   CHOLHDL 5.8 (H) 11/30/2019   Lab Results  Component Value Date   HGBA1C 5.7 (H) 11/30/2019      Assessment & Plan:   Problem List Items Addressed This Visit      Cardiovascular and Mediastinum   HYPERTENSION    Pressure looks phenomenal today continue current regimen.  Refill sent to pharmacy.      Relevant Medications   lisinopril (ZESTRIL) 20 MG tablet     Other   Hyperlipidemia    Tolerating statin well thus far.  We will need to recheck liver and lipids at his follow-up visit.      Relevant Medications   lisinopril (ZESTRIL) 20 MG tablet   Anorexia    He does feel like his appetite has gotten a little bit better.  Especially since a lot of the stressors that he was concerned about since he was here last time have actually improved.  Just really encouraged him to work on being consistent and not skip meals.  Also encouraged him to make sure that he is eating healthy food choices and not just fast food.      Abnormal weight loss - Primary    Only lost 1 pound since he was last year which I actually think is great progress.  We discussed options.  I had like to actually increase his Paxil I do feel like it actually is helping him so would like to increase to 20 mg and then have him follow-up in 6 to 8 weeks.  Hopefully we can just use this to medication temporarily and once he feels like he is back on track we can discontinue it.       Other Visit Diagnoses    Chronic pain of both knees       Relevant Medications   PARoxetine (PAXIL) 20 MG tablet   meloxicam (MOBIC) 7.5 MG tablet      Meds ordered this encounter   Medications  . lisinopril (ZESTRIL) 20 MG tablet    Sig: Take 1 tablet (20 mg total) by mouth daily.    Dispense:  90 tablet    Refill:  1  . PARoxetine (PAXIL) 20 MG tablet    Sig: Take 1 tablet (20 mg total) by mouth daily.    Dispense:  30 tablet    Refill:  2  . meloxicam (MOBIC) 7.5 MG tablet    Sig: Take 1 tablet (7.5 mg total) by mouth daily as needed for pain.    Dispense:  30 tablet    Refill:  0    Follow-up: Return in about 6 weeks (around 03/22/2020) for Check weight and mood and check BMP.    Nani Gasser, MD

## 2020-03-07 ENCOUNTER — Other Ambulatory Visit: Payer: Self-pay | Admitting: Family Medicine

## 2020-03-07 DIAGNOSIS — G43909 Migraine, unspecified, not intractable, without status migrainosus: Secondary | ICD-10-CM

## 2020-03-22 ENCOUNTER — Telehealth: Payer: Self-pay | Admitting: Family Medicine

## 2020-03-22 ENCOUNTER — Encounter: Payer: Self-pay | Admitting: Family Medicine

## 2020-03-22 ENCOUNTER — Ambulatory Visit (INDEPENDENT_AMBULATORY_CARE_PROVIDER_SITE_OTHER): Payer: Medicare Other | Admitting: Family Medicine

## 2020-03-22 VITALS — BP 148/86 | HR 88 | Ht 66.0 in | Wt 208.0 lb

## 2020-03-22 DIAGNOSIS — R634 Abnormal weight loss: Secondary | ICD-10-CM | POA: Diagnosis not present

## 2020-03-22 DIAGNOSIS — R5383 Other fatigue: Secondary | ICD-10-CM

## 2020-03-22 DIAGNOSIS — I10 Essential (primary) hypertension: Secondary | ICD-10-CM

## 2020-03-22 DIAGNOSIS — N1831 Chronic kidney disease, stage 3a: Secondary | ICD-10-CM

## 2020-03-22 DIAGNOSIS — R0982 Postnasal drip: Secondary | ICD-10-CM

## 2020-03-22 DIAGNOSIS — E785 Hyperlipidemia, unspecified: Secondary | ICD-10-CM | POA: Diagnosis not present

## 2020-03-22 NOTE — Assessment & Plan Note (Signed)
Blood pressure not well controlled today.  Have him follow-up in 2 weeks for nurse visit.  Due for updated labs.  He feels like the blood pressure increase is related to the Benadryl that he took last night for the runny nose.

## 2020-03-22 NOTE — Assessment & Plan Note (Signed)
Weight is actually stable today and he actually feels like his appetite is improving.

## 2020-03-22 NOTE — Patient Instructions (Signed)
You are continuing to do well then when I see you back we will plan to taper you off the Paxil.

## 2020-03-22 NOTE — Telephone Encounter (Signed)
Please call patient and encouraged him to start a baby aspirin daily, 81 mg.

## 2020-03-22 NOTE — Assessment & Plan Note (Addendum)
Due to recheck renal function.  He is on an ACE inhibitor.  Follow every 6 months.

## 2020-03-22 NOTE — Progress Notes (Signed)
Established Patient Office Visit  Subjective:  Patient ID: Jose Waller, male    DOB: 02/09/1951  Age: 69 y.o. MRN: [id number]  CC:  Chief Complaint  Patient presents with  . mood  . Weight Check    HPI [name] presents for He reports that he has been getting his appetite back and feeling a little bit more like "the old [place]". He asked about when he could stop taking the Paxil. I asked if he has been taking the medication as prescribed he stated that he has been and maybe he should take this for another month. He is really happy about his granddaughter being here and that his daughter just got married(the one in the [employer]).  He is sleeping well at night in fact he is doing much better in that regard he feels like he wakes up feeling rested and like he is ready to go to bed he says he still just really liking the stamina in the go and the drive that he had before he actually feels like his mood is well controlled in fact his appetite is back up he still drinks about 2 Ensure a day.  He complains of postnasal drip that he has been experiencing for a couple of weeks at this point.  No cough or congestion or sore throat or fevers or chills.  He informed me that for the past 2 days he had taken Benadryl for sinus drainage and this is why his blood pressure is elevated this morning.   He has this on and off.  He says normally he takes Coricidin but he did not have any.  He does have some Flonase at home but says he did not think to use it and normally he says it does work for him.  F/U  hyperlipidemia-he to started atorvastatin in May.  LDL was 140.  Follow-up mood-he had been feeling significantly down since he had Bell's palsy.  He is actually doing much better he is tolerating the Paxil well without any side effects or problems.  Past Medical History:  Diagnosis Date  . Allergy   . Arthritis   . Cataract    MD just watching  . CKD (chronic kidney disease) stage 3, GFR 30-59  ml/min 06/05/2018  . Cluster headache    resolved  . Glaucoma   . Hypertension   . Substance abuse (HCC)    cocaine - quit 1998    Past Surgical History:  Procedure Laterality Date  . back lumbar  2004   fusion   . COLONOSCOPY  06/09/2009   Dr Mellody Memos - normal  . WISDOM TOOTH EXTRACTION      Family History  Problem Relation Age of Onset  . Lung cancer Father        lung/ was a Hydrologist  . Alcohol abuse Other   . Hypertension Brother   . Dementia Mother   . Coronary artery disease Brother   . Colon cancer Neg Hx   . Stomach cancer Neg Hx   . Rectal cancer Neg Hx     Social History   Socioeconomic History  . Marital status: Widowed    Spouse name: Not on file  . Number of children: 2  . Years of education: 23  . Highest education level: 11th grade  Occupational History  . Occupation: retired    Comment: Technical sales engineer  Tobacco Use  . Smoking status: Former Smoker    Quit date: 07/29/2001  Years since quitting: 18.6  . Smokeless tobacco: Never Used  Vaping Use  . Vaping Use: Never used  Substance and Sexual Activity  . Alcohol use: No  . Drug use: Not Currently    Comment: Former cocaine user, quit 1998  . Sexual activity: Yes    Comment: married, 2 kids, on disability, regularly exercises.  Other Topics Concern  . Not on file  Social History Narrative   On disability.  Some exercise. Wife passed away in a motor vehicle accident. He is raising 2 daughters by himself. Former drug addict. Likes to stay busy. 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 Difficulty of Paying Living Expenses: Not on file  Food Insecurity:   . Worried About Programme researcher, broadcasting/film/video in the Last Year: Not on file  . Ran Out of Food in the Last Year: Not on file  Transportation Needs:   . Lack of Transportation (Medical): Not on file  . Lack of Transportation (Non-Medical): Not on file  Physical Activity:    . Days of Exercise per Week: Not on file  . Minutes of Exercise per Session: Not on file  Stress:   . Feeling of Stress : Not on file  Social Connections:   . Frequency of Communication with Friends and Family: Not on file  . Frequency of Social Gatherings with Friends and Family: Not on file  . Attends Religious Services: Not on file  . Active Member of Clubs or Organizations: Not on file  . Attends Banker Meetings: Not on file  . Marital Status: Not on file  Intimate Partner Violence:   . Fear of Current or Ex-Partner: Not on file  . Emotionally Abused: Not on file  . Physically Abused: Not on file  . Sexually Abused: Not on file    Outpatient Medications Prior to Visit  Medication Sig Dispense Refill  . atorvastatin (LIPITOR) 20 MG tablet Take 1 tablet (20 mg total) by mouth at bedtime. 90 tablet 3  . ibuprofen (ADVIL) 800 MG tablet TAKE 1 TABLET BY MOUTH EVERY 8 HOURS WITH FOOD AS NEEDED 90 tablet 0  . lisinopril (ZESTRIL) 20 MG tablet Take 1 tablet (20 mg total) by mouth daily. 90 tablet 1  . meloxicam (MOBIC) 7.5 MG tablet Take 1 tablet (7.5 mg total) by mouth daily as needed for pain. 30 tablet 0  . omeprazole (PRILOSEC) 40 MG capsule Take 1 capsule (40 mg total) by mouth daily. 90 capsule 3  . oxybutynin (DITROPAN-XL) 10 MG 24 hr tablet Take 1 tablet (10 mg total) by mouth at bedtime. 90 tablet 3  . PARoxetine (PAXIL) 20 MG tablet Take 1 tablet (20 mg total) by mouth daily. 30 tablet 2  . sildenafil (REVATIO) 20 MG tablet Take 2-5 tablets (40-100 mg total) by mouth daily as needed. 20 tablet 5   No facility-administered medications prior to visit.    Allergies  Allergen Reactions  . Prednisone     REACTION: hick ups    ROS Review of Systems    Objective:    Physical Exam Constitutional:      Appearance: He is well-developed.  HENT:     Head: Normocephalic and atraumatic.  Cardiovascular:     Rate and Rhythm: Normal rate and regular rhythm.      Heart sounds: Normal heart sounds.  Pulmonary:     Effort: Pulmonary effort is normal.  Breath sounds: Normal breath sounds.  Skin:    General: Skin is warm and dry.  Neurological:     Mental Status: He is alert and oriented to person, place, and time.  Psychiatric:        Behavior: Behavior normal.     BP (!) 148/86   Pulse 88   Ht 5\' 6"  (1.676 m)   Wt 208 lb (94.3 kg)   SpO2 99%   BMI 33.57 kg/m  Wt Readings from Last 3 Encounters:  03/22/20 208 lb (94.3 kg)  02/09/20 208 lb (94.3 kg)  01/12/20 209 lb (94.8 kg)     Health Maintenance Due  Topic Date Due  . COVID-19 Vaccine (2 - Moderna 2-dose series) 03/25/2020    There are no preventive care reminders to display for this patient.  Lab Results  Component Value Date   TSH 0.80 11/30/2019   Lab Results  Component Value Date   WBC 7.6 11/30/2019   HGB 15.4 11/30/2019   HCT 45.0 11/30/2019   MCV 84.4 11/30/2019   PLT 356 11/30/2019   Lab Results  Component Value Date   NA 140 11/30/2019   K 4.1 11/30/2019   CO2 25 11/30/2019   GLUCOSE 97 11/30/2019   BUN 17 11/30/2019   CREATININE 1.23 (H) 11/30/2019   BILITOT 0.4 11/30/2019   ALKPHOS 71 05/15/2016   AST 14 11/30/2019   ALT 9 11/30/2019   PROT 6.9 11/30/2019   ALBUMIN 4.1 05/15/2016   CALCIUM 9.5 11/30/2019   Lab Results  Component Value Date   CHOL 193 11/30/2019   Lab Results  Component Value Date   HDL 33 (L) 11/30/2019   Lab Results  Component Value Date   LDLCALC 140 (H) 11/30/2019   Lab Results  Component Value Date   TRIG 90 11/30/2019   Lab Results  Component Value Date   CHOLHDL 5.8 (H) 11/30/2019   Lab Results  Component Value Date   HGBA1C 5.7 (H) 11/30/2019      Assessment & Plan:   Problem List Items Addressed This Visit      Cardiovascular and Mediastinum   HYPERTENSION - Primary    Blood pressure not well controlled today.  Have him follow-up in 2 weeks for nurse visit.  Due for updated labs.  He feels like  the blood pressure increase is related to the Benadryl that he took last night for the runny nose.      Relevant Orders   COMPLETE METABOLIC PANEL WITH GFR   Lipid Panel w/reflex Direct LDL     Genitourinary   CKD stage G3a/A1, GFR 45-59 and albumin creatinine ratio <30 mg/g    Due to recheck renal function.  He is on an ACE inhibitor.  Follow every 6 months.      Relevant Orders   COMPLETE METABOLIC PANEL WITH GFR   Lipid Panel w/reflex Direct LDL     Other   Hyperlipidemia    Tolerating statin well so far.  Due to recheck lipids and liver enzymes.      Relevant Orders   COMPLETE METABOLIC PANEL WITH GFR   Lipid Panel w/reflex Direct LDL   Abnormal weight loss    Weight is actually stable today and he actually feels like his appetite is improving.       Other Visit Diagnoses    Fatigue, unspecified type       PND (post-nasal drip)          Postnasal drip-has been  going on for couple of weeks recommend restart Flonase.  If he develops any new symptoms then please let us know.   Mood is well controlled PHQ-9 score of 1 and GAD-7 score of 1 rates his symptoms as not difficult.  Previous PHQ-9 was 10.  We had originally started the Paxil more so to help with the abnormal weight loss I would like to continue it for another 4 to 6 months and at that point he is doing well we will look at tapering him off completely.  No orders of the defined types were placed in this encounter.   Follow-up: Return in about 6 months (around 09/22/2020) for Hypertension.    Nani Gasseratherine Mallorie Norrod, MD

## 2020-03-22 NOTE — Assessment & Plan Note (Signed)
Tolerating statin well so far.  Due to recheck lipids and liver enzymes.

## 2020-03-22 NOTE — Progress Notes (Signed)
He reports that he has been getting his appetite back and feeling a little bit more like "the old [place]". He asked about when he could stop taking the Paxil. I asked if he has been taking the medication as prescribed he stated that he has been and maybe he should take this for another month. He is really happy about his granddaughter being here and that his daughter just got married(the one in the [employer]).    He informed me that for the past 2 days he had taken Benadryl for sinus drainage and this is why his blood pressure is elevated this morning.

## 2020-03-22 NOTE — Telephone Encounter (Signed)
Called and advised pt of recommendation. No questions.

## 2020-03-23 ENCOUNTER — Other Ambulatory Visit: Payer: Self-pay | Admitting: Family Medicine

## 2020-03-23 DIAGNOSIS — G8929 Other chronic pain: Secondary | ICD-10-CM

## 2020-03-23 DIAGNOSIS — E785 Hyperlipidemia, unspecified: Secondary | ICD-10-CM | POA: Diagnosis not present

## 2020-03-23 DIAGNOSIS — I10 Essential (primary) hypertension: Secondary | ICD-10-CM | POA: Diagnosis not present

## 2020-03-23 DIAGNOSIS — N1831 Chronic kidney disease, stage 3a: Secondary | ICD-10-CM | POA: Diagnosis not present

## 2020-03-23 DIAGNOSIS — M25561 Pain in right knee: Secondary | ICD-10-CM

## 2020-03-23 LAB — COMPLETE METABOLIC PANEL WITH GFR
AG Ratio: 1.5 (calc) (ref 1.0–2.5)
ALT: 10 U/L (ref 9–46)
AST: 14 U/L (ref 10–35)
Albumin: 4.1 g/dL (ref 3.6–5.1)
Alkaline phosphatase (APISO): 76 U/L (ref 35–144)
BUN/Creatinine Ratio: 17 (calc) (ref 6–22)
BUN: 20 mg/dL (ref 7–25)
CO2: 28 mmol/L (ref 20–32)
Calcium: 9.6 mg/dL (ref 8.6–10.3)
Chloride: 104 mmol/L (ref 98–110)
Creat: 1.19 mg/dL — ABNORMAL HIGH (ref 0.70–1.18)
GFR, Est African American: 71 mL/min/{1.73_m2} (ref >=60)
GFR, Est Non African American: 61 mL/min/{1.73_m2} (ref >=60)
Globulin: 2.7 g/dL (calc) (ref 1.9–3.7)
Glucose, Bld: 89 mg/dL (ref 65–99)
Potassium: 4.1 mmol/L (ref 3.5–5.3)
Sodium: 139 mmol/L (ref 135–146)
Total Bilirubin: 0.5 mg/dL (ref 0.2–1.2)
Total Protein: 6.8 g/dL (ref 6.1–8.1)

## 2020-03-23 LAB — LIPID PANEL W/REFLEX DIRECT LDL
Cholesterol: 136 mg/dL (ref <200)
HDL: 44 mg/dL (ref >=40)
LDL Cholesterol (Calc): 77 mg/dL (calc)
Non-HDL Cholesterol (Calc): 92 mg/dL (calc) (ref <130)
Total CHOL/HDL Ratio: 3.1 (calc) (ref <5.0)
Triglycerides: 66 mg/dL (ref <150)

## 2020-04-05 ENCOUNTER — Ambulatory Visit (INDEPENDENT_AMBULATORY_CARE_PROVIDER_SITE_OTHER): Payer: Medicare Other | Admitting: Family Medicine

## 2020-04-05 VITALS — BP 130/82 | HR 90 | Ht 66.0 in | Wt 208.0 lb

## 2020-04-05 DIAGNOSIS — I10 Essential (primary) hypertension: Secondary | ICD-10-CM | POA: Diagnosis not present

## 2020-04-05 NOTE — Progress Notes (Signed)
Pretension-repeat blood pressure looks great we will just continue with current regimen and continue to monitor.  Nani Gasser, MD

## 2020-04-05 NOTE — Progress Notes (Signed)
° °  Subjective:    Patient ID: Jose Waller, male    DOB: [birth year], 69 y.o.   MRN: [id number]  HPI Patient is here for blood pressure check. Denies trouble sleeping, palpitations, or medication problems. Confirms he has taken all medications this AM.    Review of Systems     Objective:   Physical Exam        Assessment & Plan:  First blood pressure reading was elevated at 140/98. Patient states usually when his blood pressure is elevated he has a headache but does not have that. Patient instructed to sit for 10 minutes before re-check. Blood pressure came down to normal range on recheck at 130/82. Discussed with provider, patient to remain on same regimen and follow up as previously recommended.   HM: Patient has received second dose of COVID vaccine, this has been updated.

## 2020-04-28 ENCOUNTER — Other Ambulatory Visit: Payer: Self-pay | Admitting: *Deleted

## 2020-04-28 ENCOUNTER — Other Ambulatory Visit: Payer: Self-pay | Admitting: Family Medicine

## 2020-04-28 DIAGNOSIS — G43909 Migraine, unspecified, not intractable, without status migrainosus: Secondary | ICD-10-CM

## 2020-05-05 ENCOUNTER — Other Ambulatory Visit: Payer: Self-pay | Admitting: Family Medicine

## 2020-06-17 ENCOUNTER — Other Ambulatory Visit: Payer: Self-pay | Admitting: Family Medicine

## 2020-06-17 DIAGNOSIS — K219 Gastro-esophageal reflux disease without esophagitis: Secondary | ICD-10-CM

## 2020-06-23 ENCOUNTER — Other Ambulatory Visit: Payer: Self-pay | Admitting: Family Medicine

## 2020-06-23 DIAGNOSIS — G43909 Migraine, unspecified, not intractable, without status migrainosus: Secondary | ICD-10-CM

## 2020-08-01 ENCOUNTER — Other Ambulatory Visit: Payer: Self-pay | Admitting: Family Medicine

## 2020-09-03 ENCOUNTER — Other Ambulatory Visit: Payer: Self-pay | Admitting: Family Medicine

## 2020-09-03 DIAGNOSIS — G43909 Migraine, unspecified, not intractable, without status migrainosus: Secondary | ICD-10-CM

## 2020-09-20 ENCOUNTER — Ambulatory Visit: Payer: Medicare Other | Admitting: Family Medicine

## 2020-10-20 ENCOUNTER — Other Ambulatory Visit: Payer: Self-pay

## 2020-10-20 ENCOUNTER — Ambulatory Visit (INDEPENDENT_AMBULATORY_CARE_PROVIDER_SITE_OTHER): Payer: Medicare HMO | Admitting: Family Medicine

## 2020-10-20 ENCOUNTER — Encounter: Payer: Self-pay | Admitting: Family Medicine

## 2020-10-20 VITALS — BP 158/96 | HR 104 | Ht 66.0 in | Wt 200.0 lb

## 2020-10-20 DIAGNOSIS — N3281 Overactive bladder: Secondary | ICD-10-CM

## 2020-10-20 DIAGNOSIS — I10 Essential (primary) hypertension: Secondary | ICD-10-CM | POA: Diagnosis not present

## 2020-10-20 DIAGNOSIS — R7301 Impaired fasting glucose: Secondary | ICD-10-CM | POA: Diagnosis not present

## 2020-10-20 DIAGNOSIS — N1831 Chronic kidney disease, stage 3a: Secondary | ICD-10-CM | POA: Diagnosis not present

## 2020-10-20 HISTORY — DX: Overactive bladder: N32.81

## 2020-10-20 LAB — POCT GLYCOSYLATED HEMOGLOBIN (HGB A1C): Hemoglobin A1C: 5.6 % (ref 4.0–5.6)

## 2020-10-20 MED ORDER — LISINOPRIL 40 MG PO TABS: 40.0000 mg | ORAL_TABLET | Freq: Every day | ORAL | 1 refills | Status: DC

## 2020-10-20 MED ORDER — SOLIFENACIN SUCCINATE 10 MG PO TABS: 10.0000 mg | ORAL_TABLET | Freq: Every day | ORAL | 2 refills | Status: DC

## 2020-10-20 NOTE — Assessment & Plan Note (Signed)
D/c ditropan and will try vesicare. Consider further evaluation for enlarged prostate issues.

## 2020-10-20 NOTE — Assessment & Plan Note (Signed)
Due to recheck renal function. 

## 2020-10-20 NOTE — Assessment & Plan Note (Signed)
Uncontrolled. Will inc lisinopril to 40mg  QD. F/U in 2 weeks for nurse visit.

## 2020-10-20 NOTE — Progress Notes (Signed)
Established Patient Office Visit  Subjective:  Patient ID: Jose Waller, male    DOB: August 06, 1950  Age: 70 y.o. MRN: [id number]  CC:  Chief Complaint  Patient presents with  . Hypertension  . IFG  . Urinary Incontinence    Pt reports that Oxybutynin is ineffective    HPI Jose Waller presents for 6 mo f/u:  Hypertension- Pt denies chest pain, SOB, dizziness, or heart palpitations.  Taking meds as directed w/o problems.  Denies medication side effects.    Impaired fasting glucose-no increased thirst or urination. No symptoms consistent with hypoglycemia.  F/U CKD - stable.    F/u Mood -he said he did stop the Paxil and he feels like he is doing well without it.  He just got back from spending a month in [place] with his daughter and visiting his grandbaby.  OAB - Feels like the Ditropan is not working well.   He says a couple times a month he does not make it to the bathroom and will have an accident.  He would like to try something else if possible.  Past Medical History:  Diagnosis Date  . Allergy   . Arthritis   . Cataract    MD just watching  . CKD (chronic kidney disease) stage 3, GFR 30-59 ml/min (HCC) 06/05/2018  . Cluster headache    resolved  . Glaucoma   . Hypertension   . Substance abuse (HCC)    cocaine - quit 1998    Past Surgical History:  Procedure Laterality Date  . back lumbar  2004   fusion   . COLONOSCOPY  06/09/2009   Dr Mellody Memos - normal  . WISDOM TOOTH EXTRACTION      Family History  Problem Relation Age of Onset  . Lung cancer Father        lung/ was a Hydrologist  . Alcohol abuse Other   . Hypertension Brother   . Dementia Mother   . Coronary artery disease Brother   . Colon cancer Neg Hx   . Stomach cancer Neg Hx   . Rectal cancer Neg Hx     Social History   Socioeconomic History  . Marital status: Widowed    Spouse name: Not on file  . Number of children: 2  . Years of education: 46  . Highest education level: 11th  grade  Occupational History  . Occupation: retired    Comment: Technical sales engineer  Tobacco Use  . Smoking status: Former Smoker    Quit date: 07/29/2001    Years since quitting: 19.2  . Smokeless tobacco: Never Used  Vaping Use  . Vaping Use: Never used  Substance and Sexual Activity  . Alcohol use: No  . Drug use: Not Currently    Comment: Former cocaine user, quit 1998  . Sexual activity: Yes    Comment: married, 2 kids, on disability, regularly exercises.  Other Topics Concern  . Not on file  Social History Narrative   On disability.  Some exercise. Wife passed away in a motor vehicle accident. He is raising 2 daughters by himself. Former drug addict. Likes to stay busy. Production designer, theatre/television/film so travels looking for [employer]   Social Determinants of Health   Financial Resource Strain: Not on file  Food Insecurity: Not on file  Transportation Needs: Not on file  Physical Activity: Not on file  Stress: Not on file  Social Connections: Not on file  Intimate Partner Violence: Not on file  Outpatient Medications Prior to Visit  Medication Sig Dispense Refill  . atorvastatin (LIPITOR) 20 MG tablet Take 1 tablet (20 mg total) by mouth at bedtime. 90 tablet 3  . ibuprofen (ADVIL) 800 MG tablet TAKE 1 TABLET BY MOUTH EVERY 8 HOURS WITH FOOD AS NEEDED 90 tablet 0  . omeprazole (PRILOSEC) 40 MG capsule Take 1 capsule by mouth once daily 90 capsule 0  . sildenafil (REVATIO) 20 MG tablet TAKE 2 TO 5 TABLETS BY MOUTH ONCE DAILY AS NEEDED 20 tablet 0  . lisinopril (ZESTRIL) 20 MG tablet Take 1 tablet (20 mg total) by mouth daily. 90 tablet 1  . oxybutynin (DITROPAN-XL) 10 MG 24 hr tablet TAKE 1 TABLET BY MOUTH AT BEDTIME 90 tablet 0  . PARoxetine (PAXIL) 20 MG tablet Take 1 tablet (20 mg total) by mouth daily. 30 tablet 2   No facility-administered medications prior to visit.    Allergies  Allergen Reactions  . Prednisone     REACTION: hick ups    ROS Review of Systems     Objective:    Physical Exam Constitutional:      Appearance: He is well-developed.  HENT:     Head: Normocephalic and atraumatic.  Cardiovascular:     Rate and Rhythm: Normal rate and regular rhythm.     Heart sounds: Normal heart sounds.  Pulmonary:     Effort: Pulmonary effort is normal.     Breath sounds: Normal breath sounds.  Skin:    General: Skin is warm and dry.  Neurological:     Mental Status: He is alert and oriented to person, place, and time.  Psychiatric:        Behavior: Behavior normal.     BP (!) 158/96   Pulse (!) 104   Ht 5\' 6"  (1.676 m)   Wt 200 lb (90.7 kg)   SpO2 98%   BMI 32.28 kg/m  Wt Readings from Last 3 Encounters:  10/20/20 200 lb (90.7 kg)  04/05/20 208 lb (94.3 kg)  03/22/20 208 lb (94.3 kg)     Health Maintenance Due  Topic Date Due  . TETANUS/TDAP  05/02/2018    There are no preventive care reminders to display for this patient.  Lab Results  Component Value Date   TSH 0.80 11/30/2019   Lab Results  Component Value Date   WBC 7.6 11/30/2019   HGB 15.4 11/30/2019   HCT 45.0 11/30/2019   MCV 84.4 11/30/2019   PLT 356 11/30/2019   Lab Results  Component Value Date   NA 140 10/20/2020   K 4.5 10/20/2020   CO2 26 10/20/2020   GLUCOSE 82 10/20/2020   BUN 16 10/20/2020   CREATININE 1.21 (H) 10/20/2020   BILITOT 0.5 03/23/2020   ALKPHOS 71 05/15/2016   AST 14 03/23/2020   ALT 10 03/23/2020   PROT 6.8 03/23/2020   ALBUMIN 4.1 05/15/2016   CALCIUM 9.4 10/20/2020   Lab Results  Component Value Date   CHOL 136 03/23/2020   Lab Results  Component Value Date   HDL 44 03/23/2020   Lab Results  Component Value Date   LDLCALC 77 03/23/2020   Lab Results  Component Value Date   TRIG 66 03/23/2020   Lab Results  Component Value Date   CHOLHDL 3.1 03/23/2020   Lab Results  Component Value Date   HGBA1C 5.6 10/20/2020      Assessment & Plan:   Problem List Items Addressed This Visit  Cardiovascular  and Mediastinum   HYPERTENSION    Uncontrolled. Will inc lisinopril to 40mg  QD. F/U in 2 weeks for nurse visit.       Relevant Medications   lisinopril (ZESTRIL) 40 MG tablet   Other Relevant Orders   POCT glycosylated hemoglobin (Hb A1C) (Completed)   BASIC METABOLIC PANEL WITH GFR (Completed)     Endocrine   IFG (impaired fasting glucose) - Primary    Great job.  Brought A1c back down to 5.6 which is fantastic.  Just continue to work on healthy diet and regular exercise and plan to recheck again in 6 to 12 months.      Relevant Orders   POCT glycosylated hemoglobin (Hb A1C) (Completed)     Genitourinary   OAB (overactive bladder)    D/c ditropan and will try vesicare. Consider further evaluation for enlarged prostate issues.       Relevant Medications   solifenacin (VESICARE) 10 MG tablet   Other Relevant Orders   Ambulatory referral to Urology   CKD stage G3a/A1, GFR 45-59 and albumin creatinine ratio <30 mg/g (HCC)    Due to recheck renal  function      Relevant Orders   BASIC METABOLIC PANEL WITH GFR (Completed)      Meds ordered this encounter  Medications  . solifenacin (VESICARE) 10 MG tablet    Sig: Take 1 tablet (10 mg total) by mouth daily.    Dispense:  30 tablet    Refill:  2  . lisinopril (ZESTRIL) 40 MG tablet    Sig: Take 1 tablet (40 mg total) by mouth daily.    Dispense:  90 tablet    Refill:  1    Follow-up: Return in about 6 months (around 04/22/2021) for Hypertension.    04/24/2021, MD

## 2020-10-20 NOTE — Patient Instructions (Signed)
Follow up in 2 wks for bp check with nurse. 

## 2020-10-21 LAB — BASIC METABOLIC PANEL WITH GFR
BUN/Creatinine Ratio: 13 (calc) (ref 6–22)
BUN: 16 mg/dL (ref 7–25)
CO2: 26 mmol/L (ref 20–32)
Calcium: 9.4 mg/dL (ref 8.6–10.3)
Chloride: 105 mmol/L (ref 98–110)
Creat: 1.21 mg/dL — ABNORMAL HIGH (ref 0.70–1.18)
GFR, Est African American: 69 mL/min/{1.73_m2} (ref >=60)
GFR, Est Non African American: 60 mL/min/{1.73_m2} (ref >=60)
Glucose, Bld: 82 mg/dL (ref 65–99)
Potassium: 4.5 mmol/L (ref 3.5–5.3)
Sodium: 140 mmol/L (ref 135–146)

## 2020-10-23 DIAGNOSIS — R7301 Impaired fasting glucose: Secondary | ICD-10-CM | POA: Insufficient documentation

## 2020-10-23 HISTORY — DX: Impaired fasting glucose: R73.01

## 2020-10-23 NOTE — Assessment & Plan Note (Signed)
Great job.  Brought A1c back down to 5.6 which is fantastic.  Just continue to work on healthy diet and regular exercise and plan to recheck again in 6 to 12 months.

## 2020-11-06 ENCOUNTER — Ambulatory Visit: Payer: Medicare HMO

## 2020-11-08 ENCOUNTER — Ambulatory Visit (INDEPENDENT_AMBULATORY_CARE_PROVIDER_SITE_OTHER): Payer: Medicare HMO | Admitting: Sports Medicine

## 2020-11-08 ENCOUNTER — Other Ambulatory Visit: Payer: Self-pay

## 2020-11-08 VITALS — BP 124/100 | HR 73 | Temp 97.9°F | Resp 20 | Ht 66.0 in | Wt 204.0 lb

## 2020-11-08 DIAGNOSIS — I10 Essential (primary) hypertension: Secondary | ICD-10-CM | POA: Diagnosis not present

## 2020-11-08 NOTE — Progress Notes (Signed)
Established Patient Office Visit  Subjective:  Patient ID: Jose Waller, male    DOB: Dec 24, 1950  Age: 70 y.o. MRN: [id number]  CC:  Chief Complaint  Patient presents with  . Hypertension    HPI Jose Waller presents for a BP check. First BP 144/99, second BP 124/100.  Heart rate 73 bpm, strong and steady with no murmurs heard.  Past Medical History:  Diagnosis Date  . Allergy   . Arthritis   . Cataract    MD just watching  . CKD (chronic kidney disease) stage 3, GFR 30-59 ml/min (HCC) 06/05/2018  . Cluster headache    resolved  . Glaucoma   . Hypertension   . Substance abuse (HCC)    cocaine - quit 1998    Past Surgical History:  Procedure Laterality Date  . back lumbar  2004   fusion   . COLONOSCOPY  06/09/2009   Dr Mellody Memos - normal  . WISDOM TOOTH EXTRACTION      Family History  Problem Relation Age of Onset  . Lung cancer Father        lung/ was a Hydrologist  . Alcohol abuse Other   . Hypertension Brother   . Dementia Mother   . Coronary artery disease Brother   . Colon cancer Neg Hx   . Stomach cancer Neg Hx   . Rectal cancer Neg Hx     Social History   Socioeconomic History  . Marital status: Widowed    Spouse name: Not on file  . Number of children: 2  . Years of education: 29  . Highest education level: 11th grade  Occupational History  . Occupation: retired    Comment: Technical sales engineer  Tobacco Use  . Smoking status: Former Smoker    Quit date: 07/29/2001    Years since quitting: 19.2  . Smokeless tobacco: Never Used  Vaping Use  . Vaping Use: Never used  Substance and Sexual Activity  . Alcohol use: No  . Drug use: Not Currently    Comment: Former cocaine user, quit 1998  . Sexual activity: Yes    Comment: married, 2 kids, on disability, regularly exercises.  Other Topics Concern  . Not on file  Social History Narrative   On disability.  Some exercise. Wife passed away in a motor vehicle accident. He is raising 2 daughters  by himself. Former drug addict. Likes to stay busy. 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 atorvastatin (LIPITOR) 20 MG tablet Take 1 tablet (20 mg total) by mouth at bedtime. 90 tablet 3  . ibuprofen (ADVIL) 800 MG tablet TAKE 1 TABLET BY MOUTH EVERY 8 HOURS WITH FOOD AS NEEDED 90 tablet 0  . lisinopril (ZESTRIL) 40 MG tablet Take 1 tablet (40 mg total) by mouth daily. 90 tablet 1  . omeprazole (PRILOSEC) 40 MG capsule Take 1 capsule by mouth once daily 90 capsule 0  . sildenafil (REVATIO) 20 MG tablet TAKE 2 TO 5 TABLETS BY MOUTH ONCE DAILY AS NEEDED 20 tablet 0  . solifenacin (VESICARE) 10 MG tablet Take 1 tablet (10 mg total)  by mouth daily. 30 tablet 2   No facility-administered medications prior to visit.    Allergies  Allergen Reactions  . Prednisone     REACTION: hick ups    ROS Review of Systems    Objective:    Physical Exam  BP (!) 144/99 (BP Location: Left Arm, Patient Position: Sitting, Cuff Size: Large)   Pulse 73   Temp 97.9 F (36.6 C) (Oral)   Resp 20   Ht 5\' 6"  (1.676 m)   Wt 204 lb (92.5 kg)   SpO2 98%   BMI 32.93 kg/m  Wt Readings from Last 3 Encounters:  11/08/20 204 lb (92.5 kg)  10/20/20 200 lb (90.7 kg)  04/05/20 208 lb (94.3 kg)     Health Maintenance Due  Topic Date Due  . TETANUS/TDAP  05/02/2018    There are no preventive care reminders to display for this patient.  Lab Results  Component Value Date   TSH 0.80 11/30/2019   Lab Results  Component Value Date   WBC 7.6 11/30/2019   HGB 15.4 11/30/2019   HCT 45.0 11/30/2019   MCV 84.4 11/30/2019   PLT 356 11/30/2019   Lab  Results  Component Value Date   NA 140 10/20/2020   K 4.5 10/20/2020   CO2 26 10/20/2020   GLUCOSE 82 10/20/2020   BUN 16 10/20/2020   CREATININE 1.21 (H) 10/20/2020   BILITOT 0.5 03/23/2020   ALKPHOS 71 05/15/2016   AST 14 03/23/2020   ALT 10 03/23/2020   PROT 6.8 03/23/2020   ALBUMIN 4.1 05/15/2016   CALCIUM 9.4 10/20/2020   Lab Results  Component Value Date   CHOL 136 03/23/2020   Lab Results  Component Value Date   HDL 44 03/23/2020   Lab Results  Component Value Date   LDLCALC 77 03/23/2020   Lab Results  Component Value Date   TRIG 66 03/23/2020   Lab Results  Component Value Date   CHOLHDL 3.1 03/23/2020   Lab Results  Component Value Date   HGBA1C 5.6 10/20/2020      Assessment & Plan:  Continue current medications, return in 2 weeks for a BP check.  Problem List Items Addressed This Visit      Cardiovascular and Mediastinum   HYPERTENSION - Primary      No orders of the defined types were placed in this encounter.   Follow-up: Return in about 2 weeks (around 11/22/2020) for BP Check.    11/24/2020, CMA

## 2020-11-08 NOTE — Assessment & Plan Note (Signed)
Lisinopril seems to be working well, considering narrow pulse pressure it may be reasonable to add an echocardiogram evaluate for aortic stenosis at the future visit if not improved, I would like him to come back to repeat blood pressure check in the hopes that his diastolic will drop a bit.

## 2020-12-08 ENCOUNTER — Encounter: Payer: Self-pay | Admitting: Family Medicine

## 2020-12-08 ENCOUNTER — Ambulatory Visit (INDEPENDENT_AMBULATORY_CARE_PROVIDER_SITE_OTHER): Payer: Medicare HMO | Admitting: Family Medicine

## 2020-12-08 ENCOUNTER — Other Ambulatory Visit: Payer: Self-pay

## 2020-12-08 VITALS — BP 136/96 | HR 100 | Resp 18 | Wt 202.0 lb

## 2020-12-08 DIAGNOSIS — M1712 Unilateral primary osteoarthritis, left knee: Secondary | ICD-10-CM

## 2020-12-08 DIAGNOSIS — R5382 Chronic fatigue, unspecified: Secondary | ICD-10-CM | POA: Diagnosis not present

## 2020-12-08 MED ORDER — DICLOFENAC SODIUM 1 % EX GEL: 2.0000 g | Freq: Four times a day (QID) | CUTANEOUS | 11 refills | Status: DC

## 2020-12-08 NOTE — Progress Notes (Addendum)
Acute Office Visit  Subjective:    Patient ID: Jose Waller, male    DOB: [birth year], 70 y.o.   MRN: [id number]  Chief Complaint  Patient presents with  . Fatigue    HPI Patient is in today for fatigue  Reports 2 years ago he was told he had Bells Palsy (left facial droop, eyelid droop, slurred speech, left arm and leg weakness) - said he never had any imaging done (very well could have been CVA/TIA). Reports all of this symptoms resolved after some time except for the slurred speech and mild fatigue.  Over the past 8-9 months, he reports more significant fatigue that continues to worsen, especially over this past month. He reports that he is not having any unilateral weakness or asymetry, but just feels weak and tired in general. States he has tried to exercise regularly for the past few months to see if that helps his energy, but it is not. Feels like he sleeps pretty good at night, but he does snore, wake up tired, have to prop up on multiple pillows, naps during the day. He also reports decreased appetite recently.  He denies any chest pain, dyspnea at rest, insomnia, edema, new myalgias or arthralgias, significant weight loss, GI symptoms, mood changes/depression/anxiety.    Past Medical History:  Diagnosis Date  . Allergy   . Arthritis   . Cataract    MD just watching  . CKD (chronic kidney disease) stage 3, GFR 30-59 ml/min (HCC) 06/05/2018  . Cluster headache    resolved  . Glaucoma   . Hypertension   . Substance abuse (HCC)    cocaine - quit 1998    Past Surgical History:  Procedure Laterality Date  . back lumbar  2004   fusion   . COLONOSCOPY  06/09/2009   Dr Mellody Memos - normal  . WISDOM TOOTH EXTRACTION      Family History  Problem Relation Age of Onset  . Lung cancer Father        lung/ was a Hydrologist  . Alcohol abuse Other   . Hypertension Brother   . Dementia Mother   . Coronary artery disease Brother   . Colon cancer Neg Hx   . Stomach cancer Neg  Hx   . Rectal cancer Neg Hx     Social History   Socioeconomic History  . Marital status: Widowed    Spouse name: Not on file  . Number of children: 2  . Years of education: 92  . Highest education level: 11th grade  Occupational History  . Occupation: retired    Comment: Technical sales engineer  Tobacco Use  . Smoking status: Former Smoker    Quit date: 07/29/2001    Years since quitting: 19.3  . Smokeless tobacco: Never Used  Vaping Use  . Vaping Use: Never used  Substance and Sexual Activity  . Alcohol use: No  . Drug use: Not Currently    Comment: Former cocaine user, quit 1998  . Sexual activity: Yes    Comment: married, 2 kids, on disability, regularly exercises.  Other Topics Concern  . Not on file  Social History Narrative   On disability.  Some exercise. Wife passed away in a motor vehicle accident. He is raising 2 daughters by himself. Former drug addict. Likes to stay busy. 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 atorvastatin (LIPITOR) 20 MG tablet Take 1 tablet (20 mg total) by mouth at bedtime. 90 tablet 3  . ibuprofen (ADVIL) 800 MG tablet TAKE 1 TABLET BY MOUTH EVERY 8 HOURS WITH FOOD AS NEEDED 90 tablet 0  . lisinopril (ZESTRIL) 40 MG tablet Take 1 tablet (40 mg total) by mouth daily. 90 tablet 1  . omeprazole (PRILOSEC) 40 MG capsule Take 1 capsule by mouth once daily 90 capsule 0  . sildenafil (REVATIO) 20 MG tablet TAKE 2 TO 5 TABLETS BY MOUTH ONCE DAILY AS NEEDED 20 tablet 0  . solifenacin (VESICARE) 10 MG tablet Take 1 tablet (10 mg total) by mouth daily. 30 tablet 2   No facility-administered medications  prior to visit.    Allergies  Allergen Reactions  . Prednisone     REACTION: hick ups    Review of Systems All review of systems negative except what is listed in the HPI  STOP-BANG score = 5 (high risk for sleep apnea)    Objective:    Physical Exam Constitutional:      General: He is not in acute distress.    Appearance: Normal appearance. He is normal weight. He is not ill-appearing.  HENT:     Head: Normocephalic and atraumatic.  Cardiovascular:     Rate and Rhythm: Normal rate and regular rhythm.     Pulses: Normal pulses.     Heart sounds: Normal heart sounds.  Pulmonary:     Effort: Pulmonary effort is normal.     Breath sounds: Normal breath sounds.  Abdominal:     Palpations: Abdomen is soft.  Musculoskeletal:        General: Normal range of motion.     Cervical back: Normal range of motion and neck supple.  Lymphadenopathy:     Cervical: No cervical adenopathy.  Skin:    General: Skin is warm and dry.     Capillary Refill: Capillary refill takes less than 2 seconds.     Findings: No rash.  Neurological:     General: No focal deficit present.     Mental Status: He is alert and oriented to person, place, and time. Mental status is at baseline.     Cranial Nerves: No cranial nerve deficit.     Sensory: No sensory deficit.     Coordination: Coordination normal.     Deep Tendon Reflexes: Reflexes normal.  Psychiatric:        Mood and Affect: Mood normal.        Behavior: Behavior normal.        Thought Content: Thought content normal.        Judgment: Judgment normal.     BP (!) 136/96   Pulse 100   Resp 18   Wt 202 lb (91.6 kg)   SpO2 98%   BMI 32.60 kg/m  Wt Readings from Last 3 Encounters:  12/08/20 202 lb (91.6 kg)  11/08/20 204 lb (92.5 kg)  10/20/20 200 lb (90.7 kg)    Health Maintenance Due  Topic Date Due  . PNA vac Low Risk Adult (1 of 2 - PCV13) Never done  . TETANUS/TDAP  05/02/2018    There are no preventive care reminders to  display for this patient.  Lab Results  Component Value Date   TSH 0.80 11/30/2019   Lab Results  Component Value Date   WBC 7.6 11/30/2019   HGB 15.4 11/30/2019   HCT 45.0 11/30/2019   MCV 84.4 11/30/2019   PLT 356 11/30/2019   Lab Results  Component Value Date   NA 140 10/20/2020   K 4.5 10/20/2020   CO2 26 10/20/2020   GLUCOSE 82 10/20/2020   BUN 16 10/20/2020   CREATININE 1.21 (H) 10/20/2020   BILITOT 0.5 03/23/2020   ALKPHOS 71 05/15/2016   AST 14 03/23/2020   ALT 10 03/23/2020   PROT 6.8 03/23/2020   ALBUMIN 4.1 05/15/2016   CALCIUM 9.4 10/20/2020   Lab Results  Component Value Date   CHOL 136 03/23/2020   Lab Results  Component Value Date   HDL 44 03/23/2020   Lab Results  Component Value Date   LDLCALC 77 03/23/2020   Lab Results  Component Value Date   TRIG 66 03/23/2020   Lab Results  Component Value Date   CHOLHDL 3.1 03/23/2020   Lab Results  Component Value Date   HGBA1C 5.6 10/20/2020       Assessment & Plan:   1. Chronic fatigue Chronic fatigue for the past 2 years, that has progressively worsened over the past 9 months, with more significant fatigue this past month. Will check labs work to rule out some differentials. STOP-BANG score (5) indicates risk for sleep apnea - ordering a sleep study. Patient aware of signs and symptoms requiring urgent evaluation.  - CBC with Differential - TSH - B12 - Split night study  2. Primary osteoarthritis of left knee Patient requesting some arthritis gel for his knee - states he takes occasional ibuprofen for relief. Will try diclofenac gel. If no improvement can follow with sports medicine for injections if interested.   - diclofenac Sodium (VOLTAREN) 1 % GEL; Apply 2 g topically 4 (four) times daily. To affected joint.  Dispense: 100 g; Refill: 11   Recommend follow-up with PCP in the next 3-4 weeks to discuss results and further investigate fatigue if needed. Follow-up sooner  Clayborne Dana, NP

## 2020-12-08 NOTE — Addendum Note (Signed)
Addended by: Hyman Hopes B on: 12/08/2020 01:02 PM   Modules accepted: Orders

## 2020-12-09 LAB — CBC WITH DIFFERENTIAL/PLATELET
Absolute Monocytes: 640 cells/uL (ref 200–950)
Basophils Absolute: 66 cells/uL (ref 0–200)
Basophils Relative: 0.8 %
Eosinophils Absolute: 189 cells/uL (ref 15–500)
Eosinophils Relative: 2.3 %
HCT: 50.7 % — ABNORMAL HIGH (ref 38.5–50.0)
Hemoglobin: 16.8 g/dL (ref 13.2–17.1)
Lymphs Abs: 2337 cells/uL (ref 850–3900)
MCH: 28.5 pg (ref 27.0–33.0)
MCHC: 33.1 g/dL (ref 32.0–36.0)
MCV: 85.9 fL (ref 80.0–100.0)
MPV: 10.3 fL (ref 7.5–12.5)
Monocytes Relative: 7.8 %
Neutro Abs: 4969 cells/uL (ref 1500–7800)
Neutrophils Relative %: 60.6 %
Platelets: 342 10*3/uL (ref 140–400)
RBC: 5.9 10*6/uL — ABNORMAL HIGH (ref 4.20–5.80)
RDW: 12.8 % (ref 11.0–15.0)
Total Lymphocyte: 28.5 %
WBC: 8.2 10*3/uL (ref 3.8–10.8)

## 2020-12-09 LAB — TSH: TSH: 1.03 mIU/L (ref 0.40–4.50)

## 2020-12-09 LAB — VITAMIN B12: Vitamin B-12: 367 pg/mL (ref 200–1100)

## 2020-12-10 DIAGNOSIS — K219 Gastro-esophageal reflux disease without esophagitis: Secondary | ICD-10-CM | POA: Diagnosis not present

## 2020-12-10 DIAGNOSIS — Z888 Allergy status to other drugs, medicaments and biological substances status: Secondary | ICD-10-CM | POA: Diagnosis not present

## 2020-12-10 DIAGNOSIS — R0602 Shortness of breath: Secondary | ICD-10-CM | POA: Diagnosis not present

## 2020-12-10 DIAGNOSIS — I10 Essential (primary) hypertension: Secondary | ICD-10-CM | POA: Diagnosis not present

## 2020-12-10 DIAGNOSIS — Z79899 Other long term (current) drug therapy: Secondary | ICD-10-CM | POA: Diagnosis not present

## 2020-12-10 DIAGNOSIS — R531 Weakness: Secondary | ICD-10-CM | POA: Diagnosis not present

## 2021-01-08 ENCOUNTER — Ambulatory Visit (INDEPENDENT_AMBULATORY_CARE_PROVIDER_SITE_OTHER): Payer: Medicare HMO | Admitting: Family Medicine

## 2021-01-08 ENCOUNTER — Other Ambulatory Visit: Payer: Self-pay

## 2021-01-08 ENCOUNTER — Encounter: Payer: Self-pay | Admitting: Family Medicine

## 2021-01-08 VITALS — BP 142/84 | HR 87 | Ht 66.0 in | Wt 203.0 lb

## 2021-01-08 DIAGNOSIS — M222X2 Patellofemoral disorders, left knee: Secondary | ICD-10-CM | POA: Diagnosis not present

## 2021-01-08 DIAGNOSIS — I10 Essential (primary) hypertension: Secondary | ICD-10-CM | POA: Diagnosis not present

## 2021-01-08 DIAGNOSIS — M1712 Unilateral primary osteoarthritis, left knee: Secondary | ICD-10-CM | POA: Diagnosis not present

## 2021-01-08 DIAGNOSIS — N3281 Overactive bladder: Secondary | ICD-10-CM | POA: Diagnosis not present

## 2021-01-08 DIAGNOSIS — R4781 Slurred speech: Secondary | ICD-10-CM

## 2021-01-08 DIAGNOSIS — G43909 Migraine, unspecified, not intractable, without status migrainosus: Secondary | ICD-10-CM

## 2021-01-08 DIAGNOSIS — M222X1 Patellofemoral disorders, right knee: Secondary | ICD-10-CM | POA: Diagnosis not present

## 2021-01-08 MED ORDER — IBUPROFEN 800 MG PO TABS: ORAL_TABLET | ORAL | 0 refills | Status: DC

## 2021-01-08 MED ORDER — VALSARTAN-HYDROCHLOROTHIAZIDE 320-12.5 MG PO TABS: 1.0000 | ORAL_TABLET | Freq: Every day | ORAL | 0 refills | Status: DC

## 2021-01-08 MED ORDER — MIRABEGRON ER 50 MG PO TB24: 50.0000 mg | ORAL_TABLET | Freq: Every day | ORAL | 5 refills | Status: DC

## 2021-01-08 NOTE — Assessment & Plan Note (Signed)
Slurred speech-I do suspect that he may have had a stroke based on the longevity of his symptoms.  This was initially thought to be Bell's palsy.  Some of the facial weakness and swallowing issues have improved but he still continues to struggle with slurred speech.  We will go ahead and place order for brain MRI for further work-up.

## 2021-01-08 NOTE — Assessment & Plan Note (Signed)
Refilled his ibuprofen.

## 2021-01-08 NOTE — Assessment & Plan Note (Signed)
Uncontrolled.  Will change lisinopril to valsartan HCT.  We will need to monitor for any worrisome symptoms such as increased urination.

## 2021-01-08 NOTE — Assessment & Plan Note (Signed)
He feels the Vesicare really does not work that well he is already on the highest dose we will see if maybe the insurance will cover Myrbetriq.

## 2021-01-08 NOTE — Patient Instructions (Signed)
We will refer you to physical therapy for your knees. We will work on getting a brain MRI for the speech. We will work on getting you referred for speech therapy. I will take a look at the bladder medicine and we will see if we can find something else covered by the insurance that we could try.  I will send over new prescription later today.

## 2021-01-08 NOTE — Progress Notes (Signed)
Established Patient Office Visit  Subjective:  Patient ID: Jose Waller, male    DOB: [birth year]  Age: 70 y.o. MRN: [id number]  CC:  Chief Complaint  Patient presents with   Follow-up    HPI Jose LaymanJimmy M Morea presents for   Hypertension- Pt denies chest pain, SOB, dizziness, or heart palpitations.  Taking meds as directed w/o problems.  Denies medication side effects.   F/U SOB -went to ED to recently.   Bilateral knee pain -he says the left is worse than the right.  He says it is mostly bothersome when he is been sitting for a while and then goes to get up he will have painful popping and cracking.  Then once he gets going it actually feels a little better.  He does use the Voltaren gel and feels like that is helpful.  His daughter who is here with him today also bought him a knee brace but he says he really has not worn it.  He wants to discuss his speech.  Unfortunately he started develop slurred speech back in July 2020.  At that time he was told that he had Bell's palsy but now that is been going on for about 2 years he is concerned about the possibility of stroke.  He says he is able to drink out of that side of his mouth now without water leaking out he is no longer having choking problems that he was having previously but he still struggles with speech is always a little better in the morning and worse in the afternoons.  He also reports that he just gets extremely tired easily.  He feels like he could just lay down and sleep.  He does snore at night.  Past Medical History:  Diagnosis Date   Allergy    Arthritis    Cataract    MD just watching   CKD (chronic kidney disease) stage 3, GFR 30-59 ml/min (HCC) 06/05/2018   Cluster headache    resolved   Glaucoma    Hypertension    Substance abuse (HCC)    cocaine - quit 1998    Past Surgical History:  Procedure Laterality Date   back lumbar  2004   fusion    COLONOSCOPY  06/09/2009   Dr Mellody MemosJohn Long - normal   WISDOM TOOTH  EXTRACTION      Family History  Problem Relation Age of Onset   Lung cancer Father        lung/ was a miner   Alcohol abuse Other    Hypertension Brother    Dementia Mother    Coronary artery disease Brother    Colon cancer Neg Hx    Stomach cancer Neg Hx    Rectal cancer Neg Hx     Social History   Socioeconomic History   Marital status: Widowed    Spouse name: Not on file   Number of children: 2   Years of education: 1711   Highest education level: 11th grade  Occupational History   Occupation: retired    Comment: Technical sales engineerpest control technician  Tobacco Use   Smoking status: Former    Pack years: 0.00    Types: Cigarettes    Quit date: 07/29/2001    Years since quitting: 19.4   Smokeless tobacco: Never  Vaping Use   Vaping Use: Never used  Substance and Sexual Activity   Alcohol use: No   Drug use: Not Currently    Comment: Former cocaine user, quit 1998  Sexual activity: Yes    Comment: married, 2 kids, on disability, regularly exercises.  Other Topics Concern   Not on file  Social History Narrative   On disability.  Some exercise. Wife passed away in a motor vehicle accident. He is raising 2 daughters by himself. Former drug addict. Likes to stay busy. Production designer, theatre/television/film so travels looking for [employer]   Social Determinants of Health   Financial Resource Strain: Not on file  Food Insecurity: Not on file  Transportation Needs: Not on file  Physical Activity: Not on file  Stress: Not on file  Social Connections: Not on file  Intimate Partner Violence: Not on file    Outpatient Medications Prior to Visit  Medication Sig Dispense Refill   atorvastatin (LIPITOR) 20 MG tablet Take 1 tablet (20 mg total) by mouth at bedtime. 90 tablet 3   diclofenac Sodium (VOLTAREN) 1 % GEL Apply 2 g topically 4 (four) times daily. To affected joint. 100 g 11   omeprazole (PRILOSEC) 40 MG capsule Take 1 capsule by mouth once daily 90 capsule 0   sildenafil (REVATIO) 20 MG tablet TAKE 2  TO 5 TABLETS BY MOUTH ONCE DAILY AS NEEDED 20 tablet 0   ibuprofen (ADVIL) 800 MG tablet TAKE 1 TABLET BY MOUTH EVERY 8 HOURS WITH FOOD AS NEEDED 90 tablet 0   lisinopril (ZESTRIL) 40 MG tablet Take 1 tablet (40 mg total) by mouth daily. 90 tablet 1   solifenacin (VESICARE) 10 MG tablet Take 1 tablet (10 mg total) by mouth daily. 30 tablet 2   No facility-administered medications prior to visit.    Allergies  Allergen Reactions   Prednisone     REACTION: hick ups    ROS Review of Systems    Objective:    Physical Exam Constitutional:      Appearance: He is well-developed.  HENT:     Head: Normocephalic and atraumatic.  Cardiovascular:     Rate and Rhythm: Normal rate and regular rhythm.     Heart sounds: Normal heart sounds.  Pulmonary:     Effort: Pulmonary effort is normal.     Breath sounds: Normal breath sounds.  Skin:    General: Skin is warm and dry.  Neurological:     Mental Status: He is alert and oriented to person, place, and time.  Psychiatric:        Behavior: Behavior normal.    BP (!) 142/84 (BP Location: Left Arm, Cuff Size: Normal)   Pulse 87   Ht 5\' 6"  (1.676 m)   Wt 203 lb (92.1 kg)   SpO2 98%   BMI 32.77 kg/m  Wt Readings from Last 3 Encounters:  01/08/21 203 lb (92.1 kg)  12/08/20 202 lb (91.6 kg)  11/08/20 204 lb (92.5 kg)     Health Maintenance Due  Topic Date Due   COVID-19 Vaccine (4 - Booster for Moderna series) 12/11/2020    There are no preventive care reminders to display for this patient.  Lab Results  Component Value Date   TSH 1.03 12/08/2020   Lab Results  Component Value Date   WBC 8.2 12/08/2020   HGB 16.8 12/08/2020   HCT 50.7 (H) 12/08/2020   MCV 85.9 12/08/2020   PLT 342 12/08/2020   Lab Results  Component Value Date   NA 140 10/20/2020   K 4.5 10/20/2020   CO2 26 10/20/2020   GLUCOSE 82 10/20/2020   BUN 16 10/20/2020   CREATININE 1.21 (H) 10/20/2020  BILITOT 0.5 03/23/2020   ALKPHOS 71 05/15/2016    AST 14 03/23/2020   ALT 10 03/23/2020   PROT 6.8 03/23/2020   ALBUMIN 4.1 05/15/2016   CALCIUM 9.4 10/20/2020   Lab Results  Component Value Date   CHOL 136 03/23/2020   Lab Results  Component Value Date   HDL 44 03/23/2020   Lab Results  Component Value Date   LDLCALC 77 03/23/2020   Lab Results  Component Value Date   TRIG 66 03/23/2020   Lab Results  Component Value Date   CHOLHDL 3.1 03/23/2020   Lab Results  Component Value Date   HGBA1C 5.6 10/20/2020      Assessment & Plan:   Problem List Items Addressed This Visit       Cardiovascular and Mediastinum   Migraine    Refilled his ibuprofen.       Relevant Medications   ibuprofen (ADVIL) 800 MG tablet   valsartan-hydrochlorothiazide (DIOVAN-HCT) 320-12.5 MG tablet   HYPERTENSION - Primary    Uncontrolled.  Will change lisinopril to valsartan HCT.  We will need to monitor for any worrisome symptoms such as increased urination.       Relevant Medications   valsartan-hydrochlorothiazide (DIOVAN-HCT) 320-12.5 MG tablet     Musculoskeletal and Integument   Primary osteoarthritis of left knee   Relevant Medications   ibuprofen (ADVIL) 800 MG tablet   Other Relevant Orders   Ambulatory referral to Physical Therapy     Genitourinary   OAB (overactive bladder)    He feels the Vesicare really does not work that well he is already on the highest dose we will see if maybe the insurance will cover Myrbetriq.       Relevant Medications   mirabegron ER (MYRBETRIQ) 50 MG TB24 tablet     Other   Slurred speech    Slurred speech-I do suspect that he may have had a stroke based on the longevity of his symptoms.  This was initially thought to be Bell's palsy.  Some of the facial weakness and swallowing issues have improved but he still continues to struggle with slurred speech.  We will go ahead and place order for brain MRI for further work-up.       Relevant Orders   MR Brain W Wo Contrast   Other  Visit Diagnoses     Patellofemoral pain syndrome of both knees       Relevant Orders   Ambulatory referral to Physical Therapy       Bilateral knee pain-most consistent with patellofemoral syndrome-discussed treatment options.  Formal PT versus home PT.  He and his daughter feel like he would do best with more formal physical therapy so referral placed.   Meds ordered this encounter  Medications   ibuprofen (ADVIL) 800 MG tablet    Sig: TAKE 1 TABLET BY MOUTH EVERY 8 HOURS WITH FOOD AS NEEDED    Dispense:  90 tablet    Refill:  0   valsartan-hydrochlorothiazide (DIOVAN-HCT) 320-12.5 MG tablet    Sig: Take 1 tablet by mouth daily. This is in place of lisinopril    Dispense:  90 tablet    Refill:  0    This is in place of lisinopril   mirabegron ER (MYRBETRIQ) 50 MG TB24 tablet    Sig: Take 1 tablet (50 mg total) by mouth daily. In place of Vescare    Dispense:  30 tablet    Refill:  5    Follow-up: Return in  about 6 weeks (around 02/19/2021) for Follow-up bilateral knee pain and speech.Nani Gasser, MD

## 2021-01-11 ENCOUNTER — Encounter: Payer: Self-pay | Admitting: Physical Therapy

## 2021-01-11 ENCOUNTER — Ambulatory Visit (INDEPENDENT_AMBULATORY_CARE_PROVIDER_SITE_OTHER): Payer: Medicare HMO | Admitting: Physical Therapy

## 2021-01-11 ENCOUNTER — Other Ambulatory Visit: Payer: Self-pay

## 2021-01-11 DIAGNOSIS — M25561 Pain in right knee: Secondary | ICD-10-CM | POA: Diagnosis not present

## 2021-01-11 DIAGNOSIS — M25562 Pain in left knee: Secondary | ICD-10-CM | POA: Diagnosis not present

## 2021-01-11 DIAGNOSIS — G8929 Other chronic pain: Secondary | ICD-10-CM | POA: Diagnosis not present

## 2021-01-11 NOTE — Therapy (Signed)
[facility] [place] [address] [place], [place], [address] Phone: [phone]   Fax:  [phone]  Physical Therapy Evaluation  Patient Details  Name: Jose Waller MRN: [id number] Date of Birth: 12-11-1950 Referring Provider (PT): Nani Gasser MD   Encounter Date: 01/11/2021   PT End of Session - 01/11/21 0930     Visit Number 1    Number of Visits 12    Date for PT Re-Evaluation 03/08/21    Authorization Type HUMANA MCR    PT Start Time 0931    PT Stop Time 1010    PT Time Calculation (min) 39 min    Activity Tolerance Patient tolerated treatment well    Behavior During Therapy [facility] for tasks assessed/performed             Past Medical History:  Diagnosis Date   Allergy    Arthritis    Cataract    MD just watching   CKD (chronic kidney disease) stage 3, GFR 30-59 ml/min (HCC) 06/05/2018   Cluster headache    resolved   Glaucoma    Hypertension    Substance abuse (HCC)    cocaine - quit 1998    Past Surgical History:  Procedure Laterality Date   back lumbar  2004   fusion    COLONOSCOPY  06/09/2009   Dr Mellody Memos - normal   WISDOM TOOTH EXTRACTION      There were no vitals filed for this visit.    Subjective Assessment - 01/11/21 0934     Subjective Patient has been having pain in bil knees for a long time. Pain is worse at night or when not moving. Good when moving. Can hardly walk when its hurting.    Pertinent History OA bil knees, HTN, CKD, CVA    Diagnostic tests OA tricompartmental bil medial worst on left; chondrocacinosis on Rt.    Patient Stated Goals decrease pain    Currently in Pain? Yes    Pain Score 8     Pain Location Knee    Pain Orientation Right;Left    Pain Descriptors / Indicators Other (Comment)   pops   Pain Type Chronic pain    Pain Onset More than a month ago    Pain Frequency Intermittent    Aggravating Factors  prolonged sitting    Pain Relieving Factors moving  the knee                [facility] PT Assessment - 01/11/21 0001       Assessment   Medical Diagnosis PFPS both knees; primary OA left knee    Referring Provider (PT) Nani Gasser MD    Onset Date/Surgical Date 07/29/20    Next MD Visit 6 weeks    Prior Therapy no      Precautions   Precautions None      Restrictions   Weight Bearing Restrictions No      Balance Screen   Has the patient fallen in the past 6 months No    Has the patient had a decrease in activity level because of a fear of falling?  No    Is the patient reluctant to leave their home because of a fear of falling?  No      Home Environment   Living Environment Private residence    Living Arrangements Alone    Additional Comments daughter in [place]; no stairs      Prior Function   Level of  Independence Independent    Vocation Retired    Leisure likes road trips      Press photographer Comments genu varus bil      ROM / Strength   AROM / PROM / Strength AROM;PROM;Strength      AROM   AROM Assessment Site Knee    Right/Left Knee Right;Left    Right Knee Extension 0    Right Knee Flexion 124    Left Knee Extension -10    Left Knee Flexion 125      PROM   Overall PROM Comments left knee ext -8 deg      Strength   Strength Assessment Site Knee    Right/Left Knee Right;Left    Right Knee Flexion 5/5    Right Knee Extension 5/5    Left Knee Flexion 5/5    Left Knee Extension 5/5      Flexibility   Soft Tissue Assessment /Muscle Length yes    Hamstrings marked bil    Quadriceps WNL    ITB + ober bil Rt>Lt    Piriformis marked Rt > Lt      Palpation   Palpation comment right ITB tender      Ambulation/Gait   Gait Comments decreased heel strike intermittently on right; lacks full ext on left but good heel strike                        Objective measurements completed on examination: See above findings.               PT Education - 01/11/21 1319      Education Details HEP;  supine to sit transfer    Person(s) Educated Patient    Methods Explanation;Demonstration;Handout    Comprehension Verbalized understanding;Returned demonstration                 PT Long Term Goals - 01/11/21 1139       PT LONG TERM GOAL #1   Title Ind and compliant with HEP and progression    Time 8    Period Weeks    Status New    Target Date 03/08/21      PT LONG TERM GOAL #2   Title Patient to demo improved left knee extension to -5 or better to help normalize gait.    Time 8    Period Weeks    Status New      PT LONG TERM GOAL #3   Title Patient to report decreased frequency and intensity of knee pain with amb after sitting by >= 50%.    Time 8    Period Weeks    Status New                    Plan - 01/11/21 1015     Clinical Impression Statement Patient presents today with complaints of bil knee pain primarily after sitting or immobility. The pain affects his ability to walk but resolves fairly quickly once he gets moving. He is strong in bil LE and has functional ROM in his knees except left knee extension which is limited by 10 deg. He has marked flexibility deficits in bil piriformis, ITB, HS and hip flexors. He will benefit from PT to address these deficits to help reduce pain and ease mobility. Bed mobility.    Personal Factors and Comorbidities Comorbidity 3+    Comorbidities OA bil knees, HTN, CKD, CVA  Examination-Activity Limitations Locomotion Level    Stability/Clinical Decision Making Stable/Uncomplicated    Clinical Decision Making Low    Rehab Potential Good    PT Frequency 2x / week    PT Duration 8 weeks   12 visits total; pt on vacation for one week   PT Treatment/Interventions ADLs/Self Care Home Management;Aquatic Therapy;Moist Heat;Functional mobility training;Therapeutic activities;Therapeutic exercise;Balance training;Neuromuscular re-education;Manual techniques;Dry needling;Patient/family education     PT Next Visit Plan Work on flexibility of piriformis, ITB, HS and hip flexors and extension of left knee.    PT Home Exercise Plan [email or web address]    Consulted and Agree with Plan of Care Patient             Patient will benefit from skilled therapeutic intervention in order to improve the following deficits and impairments:  Decreased range of motion, Pain, Impaired flexibility, Postural dysfunction, Abnormal gait  Visit Diagnosis: Chronic pain of left knee - Plan: PT plan of care cert/re-cert  Chronic pain of right knee - Plan: PT plan of care cert/re-cert     Problem List Patient Active Problem List   Diagnosis Date Noted   IFG (impaired fasting glucose) 10/23/2020   OAB (overactive bladder) 10/20/2020   Abnormal weight loss 02/09/2020   Anorexia 02/09/2020   Combined forms of age-related cataract of left eye 09/23/2019   Primary osteoarthritis of left knee 04/22/2019   Slurred speech 01/28/2019   Urinary urgency 01/28/2019   CKD stage G3a/A1, GFR 45-59 and albumin creatinine ratio <30 mg/g (HCC) 06/05/2018   Migraine 09/08/2013   Low back pain 09/08/2013   Hyperlipidemia 12/31/2010   GERD 11/08/2009   HYPERTENSION 04/22/2008   Solon Palm PT 01/11/2021, 1:20 PM  [facility] [address] [place], [place], [address] Phone: [phone]   Fax:  [phone]  Name: Jose Waller MRN: [id number] Date of Birth: 1951-03-27

## 2021-01-11 NOTE — Patient Instructions (Signed)
Access Code: [email or web address] URL: [email or web address] Date: 01/11/2021 Prepared by: Raynelle Fanning  Exercises Seated Piriformis Stretch - 3 x daily - 7 x weekly - 1 sets - 3 reps - 30-60 sec hold Seated Table Piriformis Stretch - 2 x daily - 7 x weekly - 1 sets - 3 reps - 30-60 sec hold Seated Hamstring Stretch - 2 x daily - 7 x weekly - 1 sets - 3 reps - 30-60 sec hold Supine ITB Stretch with Strap - 2 x daily - 7 x weekly - 1 sets - 3 reps - 60 sec hold Hip Flexor Stretch at Edge of Bed - 1 x daily - 7 x weekly - 1 sets - 3 reps - 30-60 sec hold

## 2021-01-22 ENCOUNTER — Other Ambulatory Visit: Payer: Self-pay | Admitting: *Deleted

## 2021-01-22 ENCOUNTER — Ambulatory Visit (INDEPENDENT_AMBULATORY_CARE_PROVIDER_SITE_OTHER): Payer: Medicare HMO

## 2021-01-22 ENCOUNTER — Other Ambulatory Visit: Payer: Self-pay

## 2021-01-22 DIAGNOSIS — G319 Degenerative disease of nervous system, unspecified: Secondary | ICD-10-CM | POA: Diagnosis not present

## 2021-01-22 DIAGNOSIS — R4781 Slurred speech: Secondary | ICD-10-CM | POA: Diagnosis not present

## 2021-01-22 MED ORDER — GADOBUTROL 1 MMOL/ML IV SOLN
9.0000 mL | Freq: Once | INTRAVENOUS | Status: AC | PRN
  Administered 2021-01-22: 10 mL via INTRAVENOUS

## 2021-01-23 ENCOUNTER — Encounter: Payer: Medicare HMO | Admitting: Physical Therapy

## 2021-01-25 ENCOUNTER — Telehealth: Payer: Self-pay

## 2021-01-25 ENCOUNTER — Ambulatory Visit (INDEPENDENT_AMBULATORY_CARE_PROVIDER_SITE_OTHER): Payer: Medicare HMO | Admitting: Physical Therapy

## 2021-01-25 ENCOUNTER — Other Ambulatory Visit: Payer: Self-pay

## 2021-01-25 ENCOUNTER — Encounter: Payer: Self-pay | Admitting: Physical Therapy

## 2021-01-25 DIAGNOSIS — M25561 Pain in right knee: Secondary | ICD-10-CM | POA: Diagnosis not present

## 2021-01-25 DIAGNOSIS — G8929 Other chronic pain: Secondary | ICD-10-CM

## 2021-01-25 DIAGNOSIS — M25562 Pain in left knee: Secondary | ICD-10-CM | POA: Diagnosis not present

## 2021-01-25 NOTE — Telephone Encounter (Signed)
Pt wanted to know if someone can refer him to a speech therapist. He stated since his stroke his words doesn't come out correctly. Patient ask if you could give him a call. tvt

## 2021-01-25 NOTE — Telephone Encounter (Signed)
We faxed referral on 6/28  01/23/2021  8:30 AM Darlin Drop, [place] Sent referral with ov notes and insurance to [facility] at [phone] and [phone]. They will call and schedule with patient for good appointment time - CF - Note   Sent referral with ov notes and insurance to [facility] at [phone] and [phone]. They will call and schedule with patient for good appointment time - CF

## 2021-01-25 NOTE — Telephone Encounter (Signed)
Pt advised.

## 2021-01-25 NOTE — Patient Instructions (Addendum)
  Access Code: [email or web address] URL: [email or web address] Date: 01/25/2021 Prepared by: [facility] [place]  Exercises Seated Piriformis Stretch - 2 x daily - 7 x weekly - 1 sets - 3 reps - 15 seconds hold Seated Hamstring Stretch - 2 x daily - 7 x weekly - 1 sets - 3 reps - 15 seconds hold Gastroc Stretch on Wall - 2 x daily - 7 x weekly - 1 sets - 2-3 reps - 15 seconds hold Standing Lumbar Extension - 2 x daily - 7 x weekly - 1 sets - 2-3 reps - 5 seconds hold

## 2021-01-25 NOTE — Therapy (Addendum)
[facility] [place] [address] [place], [place], [address] Phone: [phone]   Fax:  [phone]  Physical Therapy Treatment  Patient Details  Name: Jose Waller MRN: [id number] Date of Birth: 09/30/1950 Referring Provider (PT): Nani Gasser MD   Encounter Date: 01/25/2021   PT End of Session - 01/25/21 0936     Visit Number 2    Number of Visits 12    Date for PT Re-Evaluation 03/08/21    Authorization Type HUMANA MCR    PT Start Time 0931    PT Stop Time 1010    PT Time Calculation (min) 39 min    Activity Tolerance Patient tolerated treatment well    Behavior During Therapy [facility] for tasks assessed/performed             Past Medical History:  Diagnosis Date   Allergy    Arthritis    Cataract    MD just watching   CKD (chronic kidney disease) stage 3, GFR 30-59 ml/min (HCC) 06/05/2018   Cluster headache    resolved   Glaucoma    Hypertension    Substance abuse (HCC)    cocaine - quit 1998    Past Surgical History:  Procedure Laterality Date   back lumbar  2004   fusion    COLONOSCOPY  06/09/2009   Dr Mellody Memos - normal   WISDOM TOOTH EXTRACTION      There were no vitals filed for this visit.   Subjective Assessment - 01/25/21 0936     Subjective Pt reports he just found out he had stroke in past. He states he was misdiagnosed with bells palsy.  He has been very busy lately. Hasn't done his HEP. He lost his worksheets. He reports he had a change in his BP meds and will be getting speech therapy.    Pertinent History OA bil knees, HTN, CKD, CVA    Patient Stated Goals decrease pain    Currently in Pain? No/denies    Pain Score 0-No pain                [facility] PT Assessment - 01/25/21 0001       Assessment   Medical Diagnosis PFPS both knees; primary OA left knee    Referring Provider (PT) Nani Gasser MD    Onset Date/Surgical Date 07/29/20    Prior Therapy no               [facility] Adult PT Treatment/Exercise - 01/25/21 0001       Exercises   Exercises Knee/Hip      Knee/Hip Exercises: Stretches   Passive Hamstring Stretch Right;Left;3 reps;30 seconds   seated, cues for hip hinge and straight back   Quad Stretch Right;Left;3 reps;30 seconds    Quad Stretch Limitations seated hip flexor stretch position with leg off side of chair.    Piriformis Stretch Right;Left;3 reps;30 seconds   seated fig 4   Piriformis Stretch Limitations limited range; uses UE to hold ankle onto thigh.    Gastroc Stretch Right;Left;2 reps;20 seconds   runners stretch   Gastroc Stretch Limitations tactile cues for form.    Other Knee/Hip Stretches standing lumbar ext (to neutral) with hands on counter, leaning hips forward x 3-5 sec x 5 reps    Other Knee/Hip Stretches Standing hip adductor stretch, leaning on counter, with side to side lunge - range to tolerance.      Knee/Hip Exercises: Aerobic   Nustep L4:  legs only x 5 min    Other Aerobic single laps around gym in between exercises to reduce stiffness and assess response to stretches.                    PT Education - 01/25/21 1013     Education Details HEP updated    Person(s) Educated Patient    Methods Explanation;Handout;Verbal cues;Demonstration    Comprehension Verbalized understanding;Returned demonstration                 PT Long Term Goals - 01/11/21 1139       PT LONG TERM GOAL #1   Title Ind and compliant with HEP and progression    Time 8    Period Weeks    Status New    Target Date 03/08/21      PT LONG TERM GOAL #2   Title Patient to demo improved left knee extension to -5 or better to help normalize gait.    Time 8    Period Weeks    Status New      PT LONG TERM GOAL #3   Title Patient to report decreased frequency and intensity of knee pain with amb after sitting by >= 50%.    Time 8    Period Weeks    Status New                   Plan - 01/25/21 0944      Clinical Impression Statement Encouraged compliance with HEP to assist with reduced pain in LEs. He tolerated all exercises well without increase in pain.  Pt required minor cues for form.  Pt demonstrated decreased dynamic balance in standing (staggers with head turns when talking with therapist while walking); may benefit from simple balance exercises.    Personal Factors and Comorbidities Comorbidity 3+    Comorbidities OA bil knees, HTN, CKD, CVA    Examination-Activity Limitations Locomotion Level    Stability/Clinical Decision Making Stable/Uncomplicated    Rehab Potential Good    PT Frequency 2x / week    PT Duration 8 weeks   12 visits total; pt on vacation for one week   PT Treatment/Interventions ADLs/Self Care Home Management;Aquatic Therapy;Moist Heat;Functional mobility training;Therapeutic activities;Therapeutic exercise;Balance training;Neuromuscular re-education;Manual techniques;Dry needling;Patient/family education    PT Next Visit Plan Work on flexibility of piriformis, ITB, HS and hip flexors and extension of left knee.  Balance.  Keep HEP simple to encourage compliance.  (May be interested in transferring care to [facility] to receive SP and  PT in one location).    PT Home Exercise Plan [email or web address]    Consulted and Agree with Plan of Care Patient             Patient will benefit from skilled therapeutic intervention in order to improve the following deficits and impairments:  Decreased range of motion, Pain, Impaired flexibility, Postural dysfunction, Abnormal gait  Visit Diagnosis: Chronic pain of left knee  Chronic pain of right knee     Problem List Patient Active Problem List   Diagnosis Date Noted   IFG (impaired fasting glucose) 10/23/2020   OAB (overactive bladder) 10/20/2020   Abnormal weight loss 02/09/2020   Anorexia 02/09/2020   Combined forms of age-related cataract of left eye 09/23/2019   Primary osteoarthritis of left knee 04/22/2019   Slurred  speech 01/28/2019   Urinary urgency 01/28/2019   CKD stage G3a/A1, GFR 45-59 and albumin creatinine ratio <30 mg/g (HCC) 06/05/2018  Migraine 09/08/2013   Low back pain 09/08/2013   Hyperlipidemia 12/31/2010   GERD 11/08/2009   HYPERTENSION 04/22/2008   Mayer Camel, PTA 01/25/21 1:40 PM  [facility] [address] [place], [place], [address] Phone: [phone]   Fax:  [phone]  Name: Jose Waller MRN: [id number] Date of Birth: Sep 22, 1950

## 2021-01-30 ENCOUNTER — Other Ambulatory Visit: Payer: Self-pay

## 2021-01-30 ENCOUNTER — Ambulatory Visit (INDEPENDENT_AMBULATORY_CARE_PROVIDER_SITE_OTHER): Payer: Medicare HMO | Admitting: Physical Therapy

## 2021-01-30 ENCOUNTER — Encounter: Payer: Self-pay | Admitting: Physical Therapy

## 2021-01-30 DIAGNOSIS — M25562 Pain in left knee: Secondary | ICD-10-CM

## 2021-01-30 DIAGNOSIS — G8929 Other chronic pain: Secondary | ICD-10-CM

## 2021-01-30 DIAGNOSIS — M25561 Pain in right knee: Secondary | ICD-10-CM

## 2021-01-30 NOTE — Therapy (Signed)
[facility] [place] [address] [place], [place], [address] Phone: [phone]   Fax:  [phone]  Physical Therapy Treatment  Patient Details  Name: Jose Waller MRN: [id number] Date of Birth: 1951/03/01 Referring Provider (PT): Nani Gasser MD   Encounter Date: 01/30/2021   PT End of Session - 01/30/21 0847     Visit Number 3    Number of Visits 12    Date for PT Re-Evaluation 03/08/21    Authorization Type HUMANA MCR    PT Start Time 0847    PT Stop Time 0928    PT Time Calculation (min) 41 min    Activity Tolerance Patient tolerated treatment well    Behavior During Therapy [facility] for tasks assessed/performed             Past Medical History:  Diagnosis Date   Allergy    Arthritis    Cataract    MD just watching   CKD (chronic kidney disease) stage 3, GFR 30-59 ml/min (HCC) 06/05/2018   Cluster headache    resolved   Glaucoma    Hypertension    Substance abuse (HCC)    cocaine - quit 1998    Past Surgical History:  Procedure Laterality Date   back lumbar  2004   fusion    COLONOSCOPY  06/09/2009   Dr Mellody Memos - normal   WISDOM TOOTH EXTRACTION      There were no vitals filed for this visit.   Subjective Assessment - 01/30/21 0847     Subjective No pain in the knees today.    Pertinent History OA bil knees, HTN, CKD, CVA    Diagnostic tests OA tricompartmental bil medial worst on left; chondrocacinosis on Rt.    Patient Stated Goals decrease pain    Currently in Pain? No/denies                [facility] PT Assessment - 01/30/21 0001       AROM   AROM Assessment Site Knee    Right/Left Knee Left    Left Knee Extension -10                           [facility] Adult PT Treatment/Exercise - 01/30/21 0001       Knee/Hip Exercises: Stretches   Passive Hamstring Stretch 1 rep;60 seconds;Both    Quad Stretch Right;Left;3 reps;30 seconds    Quad Stretch Limitations seated  hip flexor stretch position with leg off side of mat 3x 30 sec   cues to tuck tail to decrease back strain   Piriformis Stretch Right;Left;3 reps;30 seconds   seated fig 4   Piriformis Stretch Limitations limited range; uses UE to hold ankle onto thigh.    Gastroc Stretch Right;Left;2 reps;20 seconds   runners stretch   Gastroc Stretch Limitations tactile cues for form.    Other Knee/Hip Stretches standing lumbar ext (to neutral) with hands on counter, leaning hips forward x 3-5 sec x 5 reps    Other Knee/Hip Stretches Standing hip adductor stretch, leaning on counter, with side to side lunge - range to tolerance.      Knee/Hip Exercises: Aerobic   Recumbent Bike L2 x 4 min      Knee/Hip Exercises: Standing   Other Standing Knee Exercises tandem stance bil  PT Long Term Goals - 01/30/21 0851       PT LONG TERM GOAL #1   Title Ind and compliant with HEP and progression    Status On-going      PT LONG TERM GOAL #2   Title Patient to demo improved left knee extension to -5 or better to help normalize gait.    Status On-going      PT LONG TERM GOAL #3   Title Patient to report decreased frequency and intensity of knee pain with amb after sitting by >= 50%.    Status On-going                   Plan - 01/30/21 1308     Clinical Impression Statement Patient reports feeling really good today. He tolerated stretches well today PT encouraged him to be more compliant. He did pretty well with tandem stance today as well.    PT Treatment/Interventions ADLs/Self Care Home Management;Aquatic Therapy;Moist Heat;Functional mobility training;Therapeutic activities;Therapeutic exercise;Balance training;Neuromuscular re-education;Manual techniques;Dry needling;Patient/family education    PT Next Visit Plan Work on flexibility of piriformis, ITB, HS and hip flexors and extension of left knee.  Balance.  Keep HEP simple to encourage compliance.   Transferring to AF next week.    Consulted and Agree with Plan of Care Patient             Patient will benefit from skilled therapeutic intervention in order to improve the following deficits and impairments:  Decreased range of motion, Pain, Impaired flexibility, Postural dysfunction, Abnormal gait  Visit Diagnosis: Chronic pain of left knee  Chronic pain of right knee     Problem List Patient Active Problem List   Diagnosis Date Noted   IFG (impaired fasting glucose) 10/23/2020   OAB (overactive bladder) 10/20/2020   Abnormal weight loss 02/09/2020   Anorexia 02/09/2020   Combined forms of age-related cataract of left eye 09/23/2019   Primary osteoarthritis of left knee 04/22/2019   Slurred speech 01/28/2019   Urinary urgency 01/28/2019   CKD stage G3a/A1, GFR 45-59 and albumin creatinine ratio <30 mg/g (HCC) 06/05/2018   Migraine 09/08/2013   Low back pain 09/08/2013   Hyperlipidemia 12/31/2010   GERD 11/08/2009   HYPERTENSION 04/22/2008   Solon Palm PT 01/30/2021, 1:12 PM  [facility] [address] [place], [place], [address] Phone: [phone]   Fax:  [phone]  Name: Jose Waller MRN: [id number] Date of Birth: 11-Sep-1950

## 2021-01-31 ENCOUNTER — Ambulatory Visit: Payer: Medicare Other | Attending: Family Medicine | Admitting: Speech Pathology

## 2021-01-31 ENCOUNTER — Encounter: Payer: Self-pay | Admitting: Speech Pathology

## 2021-01-31 DIAGNOSIS — G8929 Other chronic pain: Secondary | ICD-10-CM | POA: Diagnosis present

## 2021-01-31 DIAGNOSIS — M25562 Pain in left knee: Secondary | ICD-10-CM | POA: Insufficient documentation

## 2021-01-31 DIAGNOSIS — R471 Dysarthria and anarthria: Secondary | ICD-10-CM | POA: Insufficient documentation

## 2021-01-31 DIAGNOSIS — M25561 Pain in right knee: Secondary | ICD-10-CM | POA: Insufficient documentation

## 2021-01-31 NOTE — Therapy (Signed)
[facility] [place] [address] [place], [place], [address] Phone: [phone]   Fax:  [phone]  Speech Language Pathology Evaluation  Patient Details  Name: Jose Waller MRN: [id number] Date of Birth: 1951-05-29 Referring Provider (SLP): Agapito Games, MD   Encounter Date: 01/31/2021   End of Session - 01/31/21 0850     Visit Number 1    Number of Visits 5    Date for SLP Re-Evaluation 04/03/21    SLP Start Time 0813    SLP Stop Time  0855    SLP Time Calculation (min) 42 min    Activity Tolerance Patient tolerated treatment well             Past Medical History:  Diagnosis Date   Allergy    Arthritis    Cataract    MD just watching   CKD (chronic kidney disease) stage 3, GFR 30-59 ml/min (HCC) 06/05/2018   Cluster headache    resolved   Glaucoma    Hypertension    Substance abuse (HCC)    cocaine - quit 1998    Past Surgical History:  Procedure Laterality Date   back lumbar  2004   fusion    COLONOSCOPY  06/09/2009   Dr Mellody Memos - normal   WISDOM TOOTH EXTRACTION      There were no vitals filed for this visit.       SLP Evaluation [facility] - 01/31/21 5631       SLP Visit Information   SLP Received On 01/31/21    Referring Provider (SLP) Agapito Games, MD    Onset Date ~ 2 years ago    Medical Diagnosis possible CVA      Subjective   Patient/Family Stated Goal To work on my speech      Pain Assessment   Currently in Pain? No/denies      General Information   HPI Pt is a 70 yo male who presents with dysarthria possibly 2/2 remote left corona radiata infarct and moderate chronic microvascular ischemic disease as indicated by recent MRI results. Pt reports symptoms began ~2 years prior to today.      Balance Screen   Has the patient fallen in the past 6 months No      Prior Functional Status   Type of Home Apartment     Lives With Alone    Available Support Family   Daughter -  Theora Gianotti (2 others)   Vocation Retired      Land Function   Overall Oral Motor/Sensory Function Impaired    Labial ROM Within Functional Limits    Labial Symmetry Within Functional Limits    Labial Strength Within Functional Limits    Labial Sensation Within Functional Limits    Labial Coordination WFL    Lingual ROM Within Functional Limits    Lingual Symmetry Within Functional Limits    Lingual Strength Reduced    Lingual Sensation Within Functional Limits    Lingual Coordination Reduced    Facial Symmetry Within Functional Limits    Velum Within Functional Limits      Motor Speech   Overall Motor Speech Impaired    Respiration Impaired    Level of Impairment Conversation    Phonation Normal    Articulation Impaired    Level of Impairment Word    Intelligibility Intelligibility reduced    Word 75-100% accurate    Conversation 75-100% accurate    Effective Techniques Slow  rate;Increased vocal intensity;Over-articulate      Standardized Assessments   Standardized Assessments  Other Assessment    Other Assessment Frenchay Dysarthria Assessment                               SLP Short Term Goals - 01/31/21 0956       SLP SHORT TERM GOAL #1   Title Pt will demonstate use of overarticulation at sentence level with <2 verbal cues per 10 sentences.    Time 2    Period Weeks    Status New      SLP SHORT TERM GOAL #2   Title Pt will demonstate use of slowed rate at sentence level with <2 verbal cues per 10 sentences.    Time 2    Period Weeks    Status New      SLP SHORT TERM GOAL #3   Title Pt will demonstate use of increased loudness at sentence level with <2 verbal cues per 10 sentences.    Time 2    Period Weeks    Status New              SLP Long Term Goals - 01/31/21 0954       SLP LONG TERM GOAL #1   Title Pt will demonstrate independent use of intelligibility strategies (over-articulation, slow rate, increased intensity  etc) in conversation over 3 sessions.    Time 4    Period Weeks    Status New              Plan - 01/31/21 0851     Clinical Impression Statement Pt is a 70 yo male who presents for evaluation of slurred speech. Pt reports that his speech has been slurred ~2 years, but he never received therapy. Pt reports recent MRI results revealed no acute abnormality, but remote infarct. SLP assessed pt using Frenchay Dysarthria Assessment (FDA-2). Deficits noted in respiration and articulation. Assessed word intelligibility (6/10) and sentence intelligibility (6/10). Reflexes intact. Dentures in place. No s/sx of aspiration noted with clinical swallow evaluation (reg/thin). SLP rec skilled speech services to address reduced breath support and intelligibility.    Speech Therapy Frequency 1x /week    Duration 4 weeks    Treatment/Interventions Compensatory strategies;Cueing hierarchy;Functional tasks;Patient/family education;SLP instruction and feedback;Internal/external aids;Compensatory techniques    Potential to Achieve Goals Good    Consulted and Agree with Plan of Care Patient             Patient will benefit from skilled therapeutic intervention in order to improve the following deficits and impairments:   Dysarthria and anarthria    Problem List Patient Active Problem List   Diagnosis Date Noted   IFG (impaired fasting glucose) 10/23/2020   OAB (overactive bladder) 10/20/2020   Abnormal weight loss 02/09/2020   Anorexia 02/09/2020   Combined forms of age-related cataract of left eye 09/23/2019   Primary osteoarthritis of left knee 04/22/2019   Slurred speech 01/28/2019   Urinary urgency 01/28/2019   CKD stage G3a/A1, GFR 45-59 and albumin creatinine ratio <30 mg/g (HCC) 06/05/2018   Migraine 09/08/2013   Low back pain 09/08/2013   Hyperlipidemia 12/31/2010   GERD 11/08/2009   HYPERTENSION 04/22/2008    Michelene Gardener [place] MS, [place], CBIS  01/31/2021, 10:04 AM  [facility] [place] [address] [place], [place], [address] Phone: [phone]   Fax:  [phone]  Name: ARTIS BEGGS MRN: [id number] Date of Birth: 1951-06-07

## 2021-02-01 ENCOUNTER — Ambulatory Visit (INDEPENDENT_AMBULATORY_CARE_PROVIDER_SITE_OTHER): Payer: Medicare Other | Admitting: Physical Therapy

## 2021-02-01 ENCOUNTER — Other Ambulatory Visit: Payer: Self-pay

## 2021-02-01 ENCOUNTER — Encounter: Payer: Self-pay | Admitting: Physical Therapy

## 2021-02-01 DIAGNOSIS — M25562 Pain in left knee: Secondary | ICD-10-CM | POA: Diagnosis not present

## 2021-02-01 DIAGNOSIS — G8929 Other chronic pain: Secondary | ICD-10-CM

## 2021-02-01 DIAGNOSIS — M25561 Pain in right knee: Secondary | ICD-10-CM | POA: Diagnosis not present

## 2021-02-01 NOTE — Therapy (Signed)
[facility] [place] [address] [place], [place], [address] Phone: [phone]   Fax:  [phone]  Physical Therapy Treatment  Patient Details  Name: Jose Waller MRN: [id number] Date of Birth: [birth year] Referring Provider (PT): Nani Gasser MD   Encounter Date: 02/01/2021   PT End of Session - 02/01/21 0851     Visit Number 4    Number of Visits 12    Date for PT Re-Evaluation 03/08/21    Authorization Type HUMANA MCR    PT Start Time 0846    PT Stop Time 0926    PT Time Calculation (min) 40 min    Activity Tolerance Patient tolerated treatment well    Behavior During Therapy [facility] for tasks assessed/performed             Past Medical History:  Diagnosis Date   Allergy    Arthritis    Cataract    MD just watching   CKD (chronic kidney disease) stage 3, GFR 30-59 ml/min (HCC) 06/05/2018   Cluster headache    resolved   Glaucoma    Hypertension    Substance abuse (HCC)    cocaine - quit 1998    Past Surgical History:  Procedure Laterality Date   back lumbar  2004   fusion    COLONOSCOPY  06/09/2009   Dr Mellody Memos - normal   WISDOM TOOTH EXTRACTION      There were no vitals filed for this visit.   Subjective Assessment - 02/01/21 0931     Subjective I didn't have any pain in my knees yesterday.    Pertinent History OA bil knees, HTN, CKD, CVA    Diagnostic tests OA tricompartmental bil medial worst on left; chondrocacinosis on Rt.    Patient Stated Goals decrease pain    Currently in Pain? No/denies                               [facility] Adult PT Treatment/Exercise - 02/01/21 0001       Knee/Hip Exercises: Stretches   Passive Hamstring Stretch 1 rep;60 seconds;Both    Quad Stretch Both;1 rep;60 seconds    Quad Stretch Limitations with strap    Piriformis Stretch Right;Left;3 reps;30 seconds   seated fig 4   Piriformis Stretch Limitations limited range; uses UE to hold  ankle onto thigh.    Gastroc Stretch Right;Left;2 reps;20 seconds   runners Civil engineer, contracting Limitations verbal cues for form; left side tigher    Other Knee/Hip Stretches standing lumbar ext (to neutral) with elbows on wall, leaning hips forward x 3-5 sec x 5 reps    Other Knee/Hip Stretches Standing hip adductor stretch, leaning on counter, with side to side lunge - range to tolerance.      Knee/Hip Exercises: Aerobic   Recumbent Bike L3 x 5 min with intermittent breaks. Patient required 2-3 breaks secondary to cardiovascular limitations      Knee/Hip Exercises: Standing   Heel Raises 5 reps;2 sets    Forward Step Up Both;1 set;10 reps;Hand Hold: 2;Step Height: 4"      Knee/Hip Exercises: Seated   Sit to Sand 10 reps                         PT Long Term Goals - 01/30/21 0851       PT LONG TERM GOAL #1  Title Ind and compliant with HEP and progression    Status On-going      PT LONG TERM GOAL #2   Title Patient to demo improved left knee extension to -5 or better to help normalize gait.    Status On-going      PT LONG TERM GOAL #3   Title Patient to report decreased frequency and intensity of knee pain with amb after sitting by >= 50%.    Status On-going                   Plan - 02/01/21 9826     Clinical Impression Statement Patient reports no knee pain yesterday. He is still not very compliant with HEP. He belongs to the [employer], but needs encouragement to get back to it. He reports relief with stretches. Bil hips still very tight, left > right. He did complain of some LBP today. He denies any balance problems.    Comorbidities OA bil knees, HTN, CKD, CVA    PT Treatment/Interventions ADLs/Self Care Home Management;Aquatic Therapy;Moist Heat;Functional mobility training;Therapeutic activities;Therapeutic exercise;Balance training;Neuromuscular re-education;Manual techniques;Dry needling;Patient/family education    PT Next Visit Plan Work on  flexibility of piriformis, ITB, HS and hip flexors and extension of left knee.  Keep HEP simple to encourage compliance.    PT Home Exercise Plan [id number]             Patient will benefit from skilled therapeutic intervention in order to improve the following deficits and impairments:  Decreased range of motion, Pain, Impaired flexibility, Postural dysfunction, Abnormal gait  Visit Diagnosis: Chronic pain of left knee  Chronic pain of right knee     Problem List Patient Active Problem List   Diagnosis Date Noted   IFG (impaired fasting glucose) 10/23/2020   OAB (overactive bladder) 10/20/2020   Abnormal weight loss 02/09/2020   Anorexia 02/09/2020   Combined forms of age-related cataract of left eye 09/23/2019   Primary osteoarthritis of left knee 04/22/2019   Slurred speech 01/28/2019   Urinary urgency 01/28/2019   CKD stage G3a/A1, GFR 45-59 and albumin creatinine ratio <30 mg/g (HCC) 06/05/2018   Migraine 09/08/2013   Low back pain 09/08/2013   Hyperlipidemia 12/31/2010   GERD 11/08/2009   HYPERTENSION 04/22/2008    Solon Palm PT 02/01/2021, 9:32 AM  [facility] [address] [place], [place], [address] Phone: [phone]   Fax:  [phone]  Name: Jose Waller MRN: [id number] Date of Birth: Oct 04, 1950

## 2021-02-05 ENCOUNTER — Other Ambulatory Visit: Payer: Self-pay

## 2021-02-05 ENCOUNTER — Ambulatory Visit: Payer: Medicare Other | Admitting: Speech Pathology

## 2021-02-05 ENCOUNTER — Encounter: Payer: Self-pay | Admitting: Physical Therapy

## 2021-02-05 ENCOUNTER — Ambulatory Visit: Payer: Medicare Other | Admitting: Physical Therapy

## 2021-02-05 DIAGNOSIS — M25562 Pain in left knee: Secondary | ICD-10-CM

## 2021-02-05 DIAGNOSIS — G8929 Other chronic pain: Secondary | ICD-10-CM

## 2021-02-05 DIAGNOSIS — R471 Dysarthria and anarthria: Secondary | ICD-10-CM | POA: Diagnosis not present

## 2021-02-05 NOTE — Therapy (Signed)
[facility] [place] [address] [place], [place], [address] Phone: [phone]   Fax:  [phone]  Physical Therapy Treatment  Patient Details  Name: Jose Waller MRN: [id number] Date of Birth: [birth year] Referring Provider (PT): Nani Gasser MD   Encounter Date: 02/05/2021   PT End of Session - 02/05/21 1021     Visit Number 5    Date for PT Re-Evaluation 03/08/21    Authorization Type HUMANA MCR    PT Start Time 0835    PT Stop Time 0920    PT Time Calculation (min) 45 min    Activity Tolerance Patient tolerated treatment well    Behavior During Therapy [facility] for tasks assessed/performed             Past Medical History:  Diagnosis Date   Allergy    Arthritis    Cataract    MD just watching   CKD (chronic kidney disease) stage 3, GFR 30-59 ml/min (HCC) 06/05/2018   Cluster headache    resolved   Glaucoma    Hypertension    Substance abuse (HCC)    cocaine - quit 1998    Past Surgical History:  Procedure Laterality Date   back lumbar  2004   fusion    COLONOSCOPY  06/09/2009   Dr Mellody Memos - normal   WISDOM TOOTH EXTRACTION      There were no vitals filed for this visit.   Subjective Assessment - 02/05/21 1017     Subjective Pt reports some improvement in both knees since last visit.    Pertinent History OA bil knees, HTN, CKD, CVA    Currently in Pain? No/denies                               [facility] Adult PT Treatment/Exercise - 02/05/21 0001       Knee/Hip Exercises: Aerobic   Recumbent Bike L2.5 x 3 min    Nustep L4 x 6 min      Knee/Hip Exercises: Standing   Forward Step Up Both;2 sets;10 reps;Hand Hold: 1;Hand Hold: 0;Step Height: 4"      Knee/Hip Exercises: Seated   Long Arc Quad Both;Strengthening;2 sets;10 reps    Marching Strengthening;Both;1 set;10 reps    Hamstring Curl Both;2 sets;Strengthening    Hamstring Limitations red tband    Sit to [employer] 2 sets;10 reps    cues not to allow LE to push against mat                        PT Long Term Goals - 01/30/21 0851       PT LONG TERM GOAL #1   Title Ind and compliant with HEP and progression    Status On-going      PT LONG TERM GOAL #2   Title Patient to demo improved left knee extension to -5 or better to help normalize gait.    Status On-going      PT LONG TERM GOAL #3   Title Patient to report decreased frequency and intensity of knee pain with amb after sitting by >= 50%.    Status On-going                   Plan - 02/05/21 1021     Clinical Impression Statement Pt did well overall today, some fatigue and shortness of breath noted with activity requiring  frequent rest throughout session. Cues for eccentric control needed with hamstring curls. Cue also needed to come to full standing with sit to stands.  SBA needed with step ups.    Personal Factors and Comorbidities Comorbidity 3+    Comorbidities OA bil knees, HTN, CKD, CVA    Examination-Activity Limitations Locomotion Level    Stability/Clinical Decision Making Stable/Uncomplicated    PT Frequency 2x / week    PT Duration 8 weeks    PT Treatment/Interventions ADLs/Self Care Home Management;Aquatic Therapy;Moist Heat;Functional mobility training;Therapeutic activities;Therapeutic exercise;Balance training;Neuromuscular re-education;Manual techniques;Dry needling;Patient/family education    PT Next Visit Plan progress with strengthening and functional endurance             Patient will benefit from skilled therapeutic intervention in order to improve the following deficits and impairments:  Decreased range of motion, Pain, Impaired flexibility, Postural dysfunction, Abnormal gait  Visit Diagnosis: Chronic pain of right knee  Chronic pain of left knee     Problem List Patient Active Problem List   Diagnosis Date Noted   IFG (impaired fasting glucose) 10/23/2020   OAB (overactive bladder) 10/20/2020    Abnormal weight loss 02/09/2020   Anorexia 02/09/2020   Combined forms of age-related cataract of left eye 09/23/2019   Primary osteoarthritis of left knee 04/22/2019   Slurred speech 01/28/2019   Urinary urgency 01/28/2019   CKD stage G3a/A1, GFR 45-59 and albumin creatinine ratio <30 mg/g (HCC) 06/05/2018   Migraine 09/08/2013   Low back pain 09/08/2013   Hyperlipidemia 12/31/2010   GERD 11/08/2009   HYPERTENSION 04/22/2008    Grayce Sessions, PTA 02/05/2021, 10:24 AM  [facility] [place] [address] [place], [place], [address] Phone: [phone]   Fax:  [phone]  Name: Jose Waller MRN: [id number] Date of Birth: Jul 06, 1951

## 2021-02-06 ENCOUNTER — Encounter: Payer: Self-pay | Admitting: Speech Pathology

## 2021-02-06 NOTE — Therapy (Signed)
[facility] [address] [place], [place], [address] Phone: [phone]   Fax:  [phone]  Speech Language Pathology Treatment  Patient Details  Name: Jose Waller MRN: [id number] Date of Birth: 09-Jun-1951 Referring Provider (SLP): Agapito Games, MD   Encounter Date: 02/05/2021   End of Session - 02/06/21 1326     Visit Number 2    Number of Visits 5    Date for SLP Re-Evaluation 04/03/21    SLP Start Time 0931    SLP Stop Time  1015    SLP Time Calculation (min) 44 min    Activity Tolerance Patient tolerated treatment well             Past Medical History:  Diagnosis Date   Allergy    Arthritis    Cataract    MD just watching   CKD (chronic kidney disease) stage 3, GFR 30-59 ml/min (HCC) 06/05/2018   Cluster headache    resolved   Glaucoma    Hypertension    Substance abuse (HCC)    cocaine - quit 1998    Past Surgical History:  Procedure Laterality Date   back lumbar  2004   fusion    COLONOSCOPY  06/09/2009   Dr Mellody Memos - normal   WISDOM TOOTH EXTRACTION      There were no vitals filed for this visit.   Subjective Assessment - 02/06/21 1323     Subjective Pt reported that he feels like therapy is really helping his speech.    Currently in Pain? No/denies                   ADULT SLP TREATMENT - 02/06/21 1324       General Information   Behavior/Cognition Alert;Cooperative;Pleasant mood      Treatment Provided   Treatment provided Cognitive-Linquistic      Cognitive-Linquistic Treatment   Treatment focused on Dysarthria    Skilled Treatment Edu pt on strategies to assist in increasing intelligibility in conversation. Practiced re: over articulation, slow rate, and increased loudness. Edu on situations where people may have more difficulty understanding him (ie. on phone, in restaurants)      Assessment / Recommendations / Plan   Plan Continue with current plan of care       Progression Toward Goals   Progression toward goals Progressing toward goals                SLP Short Term Goals - 02/06/21 1328       SLP SHORT TERM GOAL #1   Title Pt will demonstate use of overarticulation at sentence level with <2 verbal cues per 10 sentences.    Time 2    Period Weeks    Status On-going      SLP SHORT TERM GOAL #2   Title Pt will demonstate use of slowed rate at sentence level with <2 verbal cues per 10 sentences.    Time 2    Period Weeks    Status On-going      SLP SHORT TERM GOAL #3   Title Pt will demonstate use of increased loudness at sentence level with <2 verbal cues per 10 sentences.    Time 2    Period Weeks    Status On-going              SLP Long Term Goals - 02/06/21 1328       SLP LONG TERM GOAL #1  Title Pt will demonstrate independent use of intelligibility strategies (over-articulation, slow rate, increased intensity etc) in conversation over 3 sessions.    Time 4    Period Weeks    Status On-going              Plan - 02/06/21 1326     Clinical Impression Statement Slowed speech rate was most impactful on pt's intelligibility. Pt reports "all I have to do is SLOW down and people can understand me". SLP explained why slowing rate and pacing (for breathing) can be successful in increasing intelligibility.  SLP rec skilled speech services to address reduced breath support and intelligibility.    Speech Therapy Frequency 1x /week    Duration 4 weeks    Treatment/Interventions Compensatory strategies;Cueing hierarchy;Functional tasks;Patient/family education;SLP instruction and feedback;Internal/external aids;Compensatory techniques    Potential to Achieve Goals Good    Consulted and Agree with Plan of Care Patient             Patient will benefit from skilled therapeutic intervention in order to improve the following deficits and impairments:   Dysarthria and anarthria    Problem List Patient Active  Problem List   Diagnosis Date Noted   IFG (impaired fasting glucose) 10/23/2020   OAB (overactive bladder) 10/20/2020   Abnormal weight loss 02/09/2020   Anorexia 02/09/2020   Combined forms of age-related cataract of left eye 09/23/2019   Primary osteoarthritis of left knee 04/22/2019   Slurred speech 01/28/2019   Urinary urgency 01/28/2019   CKD stage G3a/A1, GFR 45-59 and albumin creatinine ratio <30 mg/g (HCC) 06/05/2018   Migraine 09/08/2013   Low back pain 09/08/2013   Hyperlipidemia 12/31/2010   GERD 11/08/2009   HYPERTENSION 04/22/2008    Michelene Gardener [place] MS, [place], CBIS  02/06/2021, 1:29 PM  [facility] [place] [address] [place], [place], [address] Phone: [phone]   Fax:  [phone]   Name: Jose Waller MRN: [id number] Date of Birth: 16-Nov-1950

## 2021-02-08 ENCOUNTER — Ambulatory Visit: Payer: Medicare Other | Admitting: Physical Therapy

## 2021-02-08 ENCOUNTER — Other Ambulatory Visit: Payer: Self-pay

## 2021-02-08 ENCOUNTER — Encounter: Payer: Self-pay | Admitting: Physical Therapy

## 2021-02-08 DIAGNOSIS — G8929 Other chronic pain: Secondary | ICD-10-CM

## 2021-02-08 DIAGNOSIS — M25562 Pain in left knee: Secondary | ICD-10-CM

## 2021-02-08 DIAGNOSIS — R471 Dysarthria and anarthria: Secondary | ICD-10-CM | POA: Diagnosis not present

## 2021-02-08 NOTE — Therapy (Signed)
[facility] [address] [place], [place], [address] Phone: [phone]   Fax:  [phone]  Physical Therapy Treatment  Patient Details  Name: Jose Waller MRN: [id number] Date of Birth: [birth year] Referring Provider (PT): Nani Gasser MD   Encounter Date: 02/08/2021   PT End of Session - 02/08/21 0925     Visit Number 6    Date for PT Re-Evaluation 03/08/21    Authorization Type [employer] MCR    PT Start Time 0840    PT Stop Time 0918    PT Time Calculation (min) 38 min    Activity Tolerance Patient tolerated treatment well    Behavior During Therapy [facility] for tasks assessed/performed             Past Medical History:  Diagnosis Date   Allergy    Arthritis    Cataract    MD just watching   CKD (chronic kidney disease) stage 3, GFR 30-59 ml/min (HCC) 06/05/2018   Cluster headache    resolved   Glaucoma    Hypertension    Substance abuse (HCC)    cocaine - quit 1998    Past Surgical History:  Procedure Laterality Date   back lumbar  2004   fusion    COLONOSCOPY  06/09/2009   Dr Mellody Memos - normal   WISDOM TOOTH EXTRACTION      Vitals:   02/08/21 0848  SpO2: 92%     Subjective Assessment - 02/08/21 0842     Subjective Pt says his knees are doing better but he is tired today.    Currently in Pain? No/denies    Pain Location Knee    Pain Orientation Right;Left                               [facility] Adult PT Treatment/Exercise - 02/08/21 0001       Self-Care   Self-Care Other Self-Care Comments    Other Self-Care Comments  pursed lip breathing      Knee/Hip Exercises: Aerobic   Nustep L5 x 6 min      Knee/Hip Exercises: Standing   Heel Raises 2 sets;10 reps    Lateral Step Up 1 set;Left;Right;5 reps    Forward Step Up Both;2 sets;10 reps;Hand Hold: 1;Hand Hold: 0;Step Height: 4"   cues for posture     Knee/Hip Exercises: Seated   Long Arc Quad Both;2 sets;10 reps    1.5 #   Marching Both;2 sets   1.5 #   Hamstring Curl Both;2 sets;10 reps    Hamstring Limitations red tband    Sit to Sand 2 sets;10 reps   cues not to allow LE to push against mat                        PT Long Term Goals - 02/08/21 0924       PT LONG TERM GOAL #1   Title Ind and compliant with HEP and progression    Time 8    Period Weeks    Status On-going      PT LONG TERM GOAL #2   Title Patient to demo improved left knee extension to -5 or better to help normalize gait.    Time 8    Period Weeks    Status On-going      PT LONG TERM GOAL #3   Title Patient  to report decreased frequency and intensity of knee pain with amb after sitting by >= 50%.    Time 8    Period Weeks    Status On-going                   Plan - 02/08/21 0174     Clinical Impression Statement Pt did well overall today. He reported feeling SOB thorughout treatment. Pt O2 sats stayed above 92% thorughout treatment. Pt was educated about purseld lip breathing if he is feeling SOB. Pt still required cues for eccentric control espeically during hamstring curls as well as cues for posture during step ups. He fatigued quickly thoruhgout all exercises today and needed frequent rest breaks. During STS pt was only able to perform a couple before needing a break before continueing. SBA needed with step ups.    PT Treatment/Interventions ADLs/Self Care Home Management;Aquatic Therapy;Moist Heat;Functional mobility training;Therapeutic activities;Therapeutic exercise;Balance training;Neuromuscular re-education;Manual techniques;Dry needling;Patient/family education    PT Next Visit Plan progress with strengthening and functional endurance    PT Home Exercise Plan [id number]             Patient will benefit from skilled therapeutic intervention in order to improve the following deficits and impairments:  Decreased range of motion, Pain, Impaired flexibility, Postural dysfunction, Abnormal  gait  Visit Diagnosis: Chronic pain of right knee  Chronic pain of left knee  Dysarthria and anarthria     Problem List Patient Active Problem List   Diagnosis Date Noted   IFG (impaired fasting glucose) 10/23/2020   OAB (overactive bladder) 10/20/2020   Abnormal weight loss 02/09/2020   Anorexia 02/09/2020   Combined forms of age-related cataract of left eye 09/23/2019   Primary osteoarthritis of left knee 04/22/2019   Slurred speech 01/28/2019   Urinary urgency 01/28/2019   CKD stage G3a/A1, GFR 45-59 and albumin creatinine ratio <30 mg/g (HCC) 06/05/2018   Migraine 09/08/2013   Low back pain 09/08/2013   Hyperlipidemia 12/31/2010   GERD 11/08/2009   HYPERTENSION 04/22/2008    [name], SPTA 02/08/2021, 9:26 AM  [facility] [place] [facility] [address] [place], [place], [address] Phone: [phone]   Fax:  [phone]  Name: Jose Waller MRN: [id number] Date of Birth: [birth year]

## 2021-02-09 ENCOUNTER — Ambulatory Visit: Payer: Medicare Other | Admitting: Physical Therapy

## 2021-02-09 ENCOUNTER — Ambulatory Visit: Payer: Medicare Other | Admitting: Speech Pathology

## 2021-02-12 ENCOUNTER — Encounter: Payer: Self-pay | Admitting: Speech Pathology

## 2021-02-12 ENCOUNTER — Encounter: Payer: Self-pay | Admitting: Physical Therapy

## 2021-02-12 ENCOUNTER — Ambulatory Visit: Payer: Medicare Other | Admitting: Physical Therapy

## 2021-02-12 ENCOUNTER — Other Ambulatory Visit: Payer: Self-pay

## 2021-02-12 ENCOUNTER — Ambulatory Visit: Payer: Medicare Other | Admitting: Speech Pathology

## 2021-02-12 DIAGNOSIS — G8929 Other chronic pain: Secondary | ICD-10-CM

## 2021-02-12 DIAGNOSIS — R471 Dysarthria and anarthria: Secondary | ICD-10-CM

## 2021-02-12 DIAGNOSIS — M25562 Pain in left knee: Secondary | ICD-10-CM

## 2021-02-12 NOTE — Therapy (Signed)
[facility] [place] [address] [place], [place], [address] Phone: [phone]   Fax:  [phone]  Physical Therapy Treatment  Patient Details  Name: Jose Waller MRN: [id number] Date of Birth: 03/09/1951 Referring Provider (PT): Nani Gasser MD   Encounter Date: 02/12/2021   PT End of Session - 02/12/21 0953     Visit Number 7    Number of Visits 12    Date for PT Re-Evaluation 03/08/21    Authorization Type [employer] MCR    PT Start Time 6962    PT Stop Time 0925    PT Time Calculation (min) 43 min    Activity Tolerance Patient tolerated treatment well    Behavior During Therapy [facility] for tasks assessed/performed             Past Medical History:  Diagnosis Date   Allergy    Arthritis    Cataract    MD just watching   CKD (chronic kidney disease) stage 3, GFR 30-59 ml/min (HCC) 06/05/2018   Cluster headache    resolved   Glaucoma    Hypertension    Substance abuse (HCC)    cocaine - quit 1998    Past Surgical History:  Procedure Laterality Date   back lumbar  2004   fusion    COLONOSCOPY  06/09/2009   Dr Mellody Memos - normal   WISDOM TOOTH EXTRACTION      There were no vitals filed for this visit.   Subjective Assessment - 02/12/21 0951     Subjective Pt reports he is doing well. No pain. He has felt less SOB since last visit.    Currently in Pain? No/denies                               [facility] Adult PT Treatment/Exercise - 02/12/21 0001       Knee/Hip Exercises: Stretches   Gastroc Stretch 2 reps;30 seconds   with black bar     Knee/Hip Exercises: Aerobic   Nustep L5x6 min      Knee/Hip Exercises: Machines for Strengthening   Cybex Knee Extension 10# 2x10    Cybex Knee Flexion 25# 2x10    Other Machine resisted gait 20# x3 each way      Knee/Hip Exercises: Standing   Heel Raises Both;2 sets;10 reps   with black bar   Lateral Step Up 2 sets;Both;Hand Hold: 0;Step Height:  6"   pt reported pain in L knee   Forward Step Up Both;2 sets;10 reps;Hand Hold: 0;Step Height: 6"      Knee/Hip Exercises: Seated   Sit to Sand 2 sets;10 reps;without UE support   yellow ball OHP, chest press, cues for not letting LE touch mat     Knee/Hip Exercises: Supine   Bridges Both;1 set;10 reps                         PT Long Term Goals - 02/12/21 0953       PT LONG TERM GOAL #1   Title Ind and compliant with HEP and progression    Time 8    Period Weeks    Status On-going      PT LONG TERM GOAL #2   Title Patient to demo improved left knee extension to -5 or better to help normalize gait.    Time 8    Period Weeks  Status On-going      PT LONG TERM GOAL #3   Title Patient to report decreased frequency and intensity of knee pain with amb after sitting by >= 50%.    Time 8    Period Weeks    Status On-going                   Plan - 02/12/21 9470     Clinical Impression Statement Pt tolerated all exercises well. He needed less rest breaks throughout but still reported fatigue during exercises. He also reports he has been feeling less SOB. Pt did report pain in his L knee during lateral step ups. Cues needed throughout treatment for eccentric control.    PT Treatment/Interventions ADLs/Self Care Home Management;Aquatic Therapy;Moist Heat;Functional mobility training;Therapeutic activities;Therapeutic exercise;Balance training;Neuromuscular re-education;Manual techniques;Dry needling;Patient/family education    PT Next Visit Plan progress with strengthening and functional endurance    PT Home Exercise Plan [id number]    Consulted and Agree with Plan of Care Patient             Patient will benefit from skilled therapeutic intervention in order to improve the following deficits and impairments:  Decreased range of motion, Pain, Impaired flexibility, Postural dysfunction, Abnormal gait  Visit Diagnosis: Chronic pain of right  knee  Chronic pain of left knee  Dysarthria and anarthria     Problem List Patient Active Problem List   Diagnosis Date Noted   IFG (impaired fasting glucose) 10/23/2020   OAB (overactive bladder) 10/20/2020   Abnormal weight loss 02/09/2020   Anorexia 02/09/2020   Combined forms of age-related cataract of left eye 09/23/2019   Primary osteoarthritis of left knee 04/22/2019   Slurred speech 01/28/2019   Urinary urgency 01/28/2019   CKD stage G3a/A1, GFR 45-59 and albumin creatinine ratio <30 mg/g (HCC) 06/05/2018   Migraine 09/08/2013   Low back pain 09/08/2013   Hyperlipidemia 12/31/2010   GERD 11/08/2009   HYPERTENSION 04/22/2008    [name], SPTA 02/12/2021, 9:59 AM  [facility] [place] [facility] [address] [place], [place], [address] Phone: [phone]   Fax:  [phone]  Name: Jose Waller MRN: [id number] Date of Birth: September 05, 1950

## 2021-02-12 NOTE — Therapy (Signed)
[facility] [place] [facility] [address] [place], [place], [address] Phone: [phone]   Fax:  [phone]  Speech Language Pathology Treatment  Patient Details  Name: Jose Waller MRN: [id number] Date of Birth: July 27, 1951 Referring Provider (SLP): Agapito Games, MD   Encounter Date: 02/12/2021   End of Session - 02/12/21 1435     Visit Number 3    Number of Visits 5    Date for SLP Re-Evaluation 04/03/21    SLP Start Time 0800    SLP Stop Time  0840    SLP Time Calculation (min) 40 min    Activity Tolerance Patient tolerated treatment well             Past Medical History:  Diagnosis Date   Allergy    Arthritis    Cataract    MD just watching   CKD (chronic kidney disease) stage 3, GFR 30-59 ml/min (HCC) 06/05/2018   Cluster headache    resolved   Glaucoma    Hypertension    Substance abuse (HCC)    cocaine - quit 1998    Past Surgical History:  Procedure Laterality Date   back lumbar  2004   fusion    COLONOSCOPY  06/09/2009   Dr Mellody Memos - normal   WISDOM TOOTH EXTRACTION      There were no vitals filed for this visit.   Subjective Assessment - 02/12/21 1433     Subjective "I always have a good day!"    Currently in Pain? No/denies                   ADULT SLP TREATMENT - 02/12/21 1434       General Information   Behavior/Cognition Alert;Cooperative;Pleasant mood      Treatment Provided   Treatment provided Cognitive-Linquistic      Cognitive-Linquistic Treatment   Treatment focused on Dysarthria    Skilled Treatment Edu pt on strategies to assist in increasing intelligibility in conversation. Pt was able to recall 2/3 strategies; re: over articulation, slow rate, and increased loudness. Pt demonstrated strategies while reading other strategies for intellgibility.      Assessment / Recommendations / Plan   Plan Continue with current plan of care      Progression Toward Goals    Progression toward goals Progressing toward goals                SLP Short Term Goals - 02/12/21 1436       SLP SHORT TERM GOAL #1   Title Pt will demonstate use of overarticulation at sentence level with <2 verbal cues per 10 sentences.    Time 2    Period Weeks    Status On-going    Target Date 02/14/21      SLP SHORT TERM GOAL #2   Title Pt will demonstate use of slowed rate at sentence level with <2 verbal cues per 10 sentences.    Time 2    Period Weeks    Status On-going    Target Date 02/14/21      SLP SHORT TERM GOAL #3   Title Pt will demonstate use of increased loudness at sentence level with <2 verbal cues per 10 sentences.    Time 2    Period Weeks    Status On-going    Target Date 02/14/21              SLP Long Term Goals - 02/12/21 1436  SLP LONG TERM GOAL #1   Title Pt will demonstrate independent use of intelligibility strategies (over-articulation, slow rate, increased intensity etc) in conversation over 3 sessions.    Time 4    Period Weeks    Status On-going    Target Date 03/03/21              Plan - 02/12/21 1435     Clinical Impression Statement Pt required minA verbal cues to overarticulate words to increase understandability. SLP rec skilled speech services to address reduced breath support and intelligibility.    Speech Therapy Frequency 1x /week    Duration 4 weeks    Treatment/Interventions Compensatory strategies;Cueing hierarchy;Functional tasks;Patient/family education;SLP instruction and feedback;Internal/external aids;Compensatory techniques    Potential to Achieve Goals Good    Consulted and Agree with Plan of Care Patient             Patient will benefit from skilled therapeutic intervention in order to improve the following deficits and impairments:   Dysarthria and anarthria    Problem List Patient Active Problem List   Diagnosis Date Noted   IFG (impaired fasting glucose) 10/23/2020   OAB  (overactive bladder) 10/20/2020   Abnormal weight loss 02/09/2020   Anorexia 02/09/2020   Combined forms of age-related cataract of left eye 09/23/2019   Primary osteoarthritis of left knee 04/22/2019   Slurred speech 01/28/2019   Urinary urgency 01/28/2019   CKD stage G3a/A1, GFR 45-59 and albumin creatinine ratio <30 mg/g (HCC) 06/05/2018   Migraine 09/08/2013   Low back pain 09/08/2013   Hyperlipidemia 12/31/2010   GERD 11/08/2009   HYPERTENSION 04/22/2008    Michelene Gardener [place] MS, [place], CBIS  02/12/2021, 2:38 PM  [facility] [place] [facility] [address] [place], [place], [address] Phone: [phone]   Fax:  [phone]   Name: Jose Waller MRN: [id number] Date of Birth: Feb 17, 1951

## 2021-02-19 ENCOUNTER — Other Ambulatory Visit: Payer: Self-pay

## 2021-02-19 ENCOUNTER — Encounter: Payer: Self-pay | Admitting: Family Medicine

## 2021-02-19 ENCOUNTER — Ambulatory Visit: Payer: Medicare Other | Admitting: Physical Therapy

## 2021-02-19 ENCOUNTER — Encounter: Payer: Self-pay | Admitting: Physical Therapy

## 2021-02-19 ENCOUNTER — Ambulatory Visit (INDEPENDENT_AMBULATORY_CARE_PROVIDER_SITE_OTHER): Payer: Medicare Other | Admitting: Family Medicine

## 2021-02-19 VITALS — BP 121/75 | HR 80 | Ht 66.0 in | Wt 202.0 lb

## 2021-02-19 DIAGNOSIS — R4781 Slurred speech: Secondary | ICD-10-CM

## 2021-02-19 DIAGNOSIS — G8929 Other chronic pain: Secondary | ICD-10-CM

## 2021-02-19 DIAGNOSIS — I10 Essential (primary) hypertension: Secondary | ICD-10-CM | POA: Diagnosis not present

## 2021-02-19 DIAGNOSIS — R471 Dysarthria and anarthria: Secondary | ICD-10-CM | POA: Diagnosis not present

## 2021-02-19 DIAGNOSIS — N3281 Overactive bladder: Secondary | ICD-10-CM

## 2021-02-19 LAB — BASIC METABOLIC PANEL WITH GFR
BUN/Creatinine Ratio: 13 (calc) (ref 6–22)
BUN: 18 mg/dL (ref 7–25)
CO2: 28 mmol/L (ref 20–32)
Calcium: 9.6 mg/dL (ref 8.6–10.3)
Chloride: 104 mmol/L (ref 98–110)
Creat: 1.39 mg/dL — ABNORMAL HIGH (ref 0.70–1.28)
Glucose, Bld: 102 mg/dL — ABNORMAL HIGH (ref 65–99)
Potassium: 4.1 mmol/L (ref 3.5–5.3)
Sodium: 141 mmol/L (ref 135–146)
eGFR: 54 mL/min/{1.73_m2} — ABNORMAL LOW (ref >=60)

## 2021-02-19 MED ORDER — DARIFENACIN HYDROBROMIDE ER 15 MG PO TB24: 15.0000 mg | ORAL_TABLET | Freq: Every day | ORAL | 2 refills | Status: DC

## 2021-02-19 NOTE — Progress Notes (Signed)
Established Patient Office Visit  Subjective:  Patient ID: Jose Waller, male    DOB: [birth year]  Age: 70 y.o. MRN: [id number]  CC:  Chief Complaint  Patient presents with   Hypertension    HPI ATZIN BUCHTA presents for f/u   Dysarthria since July 2020- has started PT for this, and in fact his last session is today he really feels like it has been helpful and is supposed to actually give a speech in the fall and wants to be able to do that.  He is also been doing physical therapy for his knees and says that actually has been really helpful as well in fact he really has not had any pain at all lately in his knees.  Hypertension- Pt denies chest pain, SOB, dizziness, or heart palpitations.  Taking meds as directed w/o problems.  Denies medication side effects.  We changed lisinopril to valsartan HCT.  He is no longer having headaches.   OAB - he is still urinating frequently.  We had changed from Vesicare to Myrbetriq.    His appetite is better.  Says he is eating better.     Past Medical History:  Diagnosis Date   Allergy    Arthritis    Cataract    MD just watching   CKD (chronic kidney disease) stage 3, GFR 30-59 ml/min (HCC) 06/05/2018   Cluster headache    resolved   Glaucoma    Hypertension    Substance abuse (HCC)    cocaine - quit 1998    Past Surgical History:  Procedure Laterality Date   back lumbar  2004   fusion    COLONOSCOPY  06/09/2009   Dr Mellody Memos - normal   WISDOM TOOTH EXTRACTION      Family History  Problem Relation Age of Onset   Lung cancer Father        lung/ was a miner   Alcohol abuse Other    Hypertension Brother    Dementia Mother    Coronary artery disease Brother    Colon cancer Neg Hx    Stomach cancer Neg Hx    Rectal cancer Neg Hx     Social History   Socioeconomic History   Marital status: Widowed    Spouse name: Not on file   Number of children: 2   Years of education: 75   Highest education level: 11th grade   Occupational History   Occupation: retired    Comment: Technical sales engineer  Tobacco Use   Smoking status: Former    Types: Cigarettes    Quit date: 07/29/2001    Years since quitting: 19.5   Smokeless tobacco: Never  Vaping Use   Vaping Use: Never used  Substance and Sexual Activity   Alcohol use: No   Drug use: Not Currently    Comment: Former cocaine user, quit 1998   Sexual activity: Yes    Comment: married, 2 kids, on disability, regularly exercises.  Other Topics Concern   Not on file  Social History Narrative   On disability.  Some exercise. Wife passed away in a motor vehicle accident. He is raising 2 daughters by himself. Former drug addict. Likes to stay busy. Production designer, theatre/television/film so travels looking for [employer]   Social Determinants of Health   Financial Resource Strain: Not on file  Food Insecurity: Not on file  Transportation Needs: Not on file  Physical Activity: Not on file  Stress: Not on file  Social Connections:  Not on file  Intimate Partner Violence: Not on file    Outpatient Medications Prior to Visit  Medication Sig Dispense Refill   atorvastatin (LIPITOR) 20 MG tablet Take 1 tablet (20 mg total) by mouth at bedtime. 90 tablet 3   diclofenac Sodium (VOLTAREN) 1 % GEL Apply 2 g topically 4 (four) times daily. To affected joint. 100 g 11   ibuprofen (ADVIL) 800 MG tablet TAKE 1 TABLET BY MOUTH EVERY 8 HOURS WITH FOOD AS NEEDED 90 tablet 0   omeprazole (PRILOSEC) 40 MG capsule Take 1 capsule by mouth once daily 90 capsule 0   sildenafil (REVATIO) 20 MG tablet TAKE 2 TO 5 TABLETS BY MOUTH ONCE DAILY AS NEEDED 20 tablet 0   valsartan-hydrochlorothiazide (DIOVAN-HCT) 320-12.5 MG tablet Take 1 tablet by mouth daily. This is in place of lisinopril 90 tablet 0   mirabegron ER (MYRBETRIQ) 50 MG TB24 tablet Take 1 tablet (50 mg total) by mouth daily. In place of Vescare 30 tablet 5   No facility-administered medications prior to visit.    Allergies  Allergen  Reactions   Prednisone     REACTION: hick ups    ROS Review of Systems    Objective:    Physical Exam Constitutional:      Appearance: Normal appearance. He is well-developed.  HENT:     Head: Normocephalic and atraumatic.  Cardiovascular:     Rate and Rhythm: Normal rate and regular rhythm.     Heart sounds: Normal heart sounds.  Pulmonary:     Effort: Pulmonary effort is normal.     Breath sounds: Normal breath sounds.  Skin:    General: Skin is warm and dry.  Neurological:     Mental Status: He is alert and oriented to person, place, and time. Mental status is at baseline.  Psychiatric:        Behavior: Behavior normal.    BP 121/75   Pulse 80   Ht 5\' 6"  (1.676 m)   Wt 202 lb (91.6 kg)   SpO2 100%   BMI 32.60 kg/m  Wt Readings from Last 3 Encounters:  02/19/21 202 lb (91.6 kg)  01/08/21 203 lb (92.1 kg)  12/08/20 202 lb (91.6 kg)     Health Maintenance Due  Topic Date Due   COVID-19 Vaccine (4 - Booster for Moderna series) 12/11/2020    There are no preventive care reminders to display for this patient.  Lab Results  Component Value Date   TSH 1.03 12/08/2020   Lab Results  Component Value Date   WBC 8.2 12/08/2020   HGB 16.8 12/08/2020   HCT 50.7 (H) 12/08/2020   MCV 85.9 12/08/2020   PLT 342 12/08/2020   Lab Results  Component Value Date   NA 140 10/20/2020   K 4.5 10/20/2020   CO2 26 10/20/2020   GLUCOSE 82 10/20/2020   BUN 16 10/20/2020   CREATININE 1.21 (H) 10/20/2020   BILITOT 0.5 03/23/2020   ALKPHOS 71 05/15/2016   AST 14 03/23/2020   ALT 10 03/23/2020   PROT 6.8 03/23/2020   ALBUMIN 4.1 05/15/2016   CALCIUM 9.4 10/20/2020   Lab Results  Component Value Date   CHOL 136 03/23/2020   Lab Results  Component Value Date   HDL 44 03/23/2020   Lab Results  Component Value Date   LDLCALC 77 03/23/2020   Lab Results  Component Value Date   TRIG 66 03/23/2020   Lab Results  Component Value Date  CHOLHDL 3.1 03/23/2020    Lab Results  Component Value Date   HGBA1C 5.6 10/20/2020      Assessment & Plan:   Problem List Items Addressed This Visit       Cardiovascular and Mediastinum   HYPERTENSION    Well controlled. Continue current regimen. Follow up in  6 mo        Relevant Orders   BASIC METABOLIC PANEL WITH GFR     Genitourinary   OAB (overactive bladder)    Already tried Vesicare and Myrbetriq I do feel like the Vesicare worked a little bit better we will give the Enablex a try.  It may be the addition of the diuretic that we added to better control his blood pressure and a little hesitant to stop it because his blood pressure actually looks fabulous today.  And typically the diuresis effect that he is out after several weeks on the medication.  We could also consider treating for BPH as well and attacking his in little bit different direction if the Enablex is also not effective or consider urology referral.       Relevant Medications   darifenacin (ENABLEX) 15 MG 24 hr tablet     Other   Slurred speech - Primary    Doing well with therapy sessions he will finish up today I just reminded him to continue to work on his exercises that he has been learning even after therapy has ended.        Meds ordered this encounter  Medications   darifenacin (ENABLEX) 15 MG 24 hr tablet    Sig: Take 1 tablet (15 mg total) by mouth daily.    Dispense:  30 tablet    Refill:  2    Follow-up: Return in about 6 months (around 08/22/2021) for Hypertension.    Nani Gasser, MD

## 2021-02-19 NOTE — Assessment & Plan Note (Signed)
Already tried Information systems manager and Myrbetriq I do feel like the Vesicare worked a little bit better we will give the Enablex a try.  It may be the addition of the diuretic that we added to better control his blood pressure and a little hesitant to stop it because his blood pressure actually looks fabulous today.  And typically the diuresis effect that he is out after several weeks on the medication.  We could also consider treating for BPH as well and attacking his in little bit different direction if the Enablex is also not effective or consider urology referral.

## 2021-02-19 NOTE — Assessment & Plan Note (Signed)
Doing well with therapy sessions he will finish up today I just reminded him to continue to work on his exercises that he has been learning even after therapy has ended.

## 2021-02-19 NOTE — Assessment & Plan Note (Signed)
Well controlled. Continue current regimen. Follow up in  6 mo  

## 2021-02-19 NOTE — Therapy (Signed)
[facility] [address] [place], [place], [address] Phone: [phone]   Fax:  [phone]  Physical Therapy Treatment  Patient Details  Name: Jose Waller MRN: [id number] Date of Birth: 30-Oct-1950 Referring Provider (PT): Nani Gasser MD   Encounter Date: 02/19/2021   PT End of Session - 02/19/21 0909     Visit Number 8    Number of Visits 12    Date for PT Re-Evaluation 03/08/21    Authorization Type HUMA[employer] MCR    PT Start Time 0825    PT Stop Time 0905    PT Time Calculation (min) 40 min    Activity Tolerance Patient tolerated treatment well    Behavior During Therapy [facility] for tasks assessed/performed             Past Medical History:  Diagnosis Date   Allergy    Arthritis    Cataract    MD just watching   CKD (chronic kidney disease) stage 3, GFR 30-59 ml/min (HCC) 06/05/2018   Cluster headache    resolved   Glaucoma    Hypertension    Substance abuse (HCC)    cocaine - quit 1998    Past Surgical History:  Procedure Laterality Date   back lumbar  2004   fusion    COLONOSCOPY  06/09/2009   Dr Mellody Memos - normal   WISDOM TOOTH EXTRACTION      There were no vitals filed for this visit.   Subjective Assessment - 02/19/21 0824     Subjective Pt is doing well. He went to the doctor this morning and reports everything was good. His knees are bothering him this morning.    Currently in Pain? Yes    Pain Score 5     Pain Location Knee    Pain Orientation Right;Left                               [facility] Adult PT Treatment/Exercise - 02/19/21 0001       Ambulation/Gait   Ambulation/Gait Yes    Ambulation Surface Unlevel;Outdoor    Gait Comments Pt needed frequent rest breaks, decreased velocity going uphill with slant, pt with upright posture going up hill, of forward lean to drive himself up hill.     Knee/Hip Exercises: Aerobic   Recumbent Bike --    Nustep L5x6 min       Knee/Hip Exercises: Machines for Strengthening   Cybex Knee Extension 10# 2x10    Cybex Knee Flexion 25# 2x10      Knee/Hip Exercises: Seated   Sit to Sand without UE support;5 reps;3 sets      Knee/Hip Exercises: Supine   Bridges Both;10 reps;2 sets                         PT Long Term Goals - 02/19/21 0909       PT LONG TERM GOAL #1   Title Ind and compliant with HEP and progression    Time 8    Period Weeks    Status On-going      PT LONG TERM GOAL #2   Title Patient to demo improved left knee extension to -5 or better to help normalize gait.    Time 8    Period Weeks      PT LONG TERM GOAL #3   Title Patient to report  decreased frequency and intensity of knee pain with amb after sitting by >= 50%.    Time 8    Period Weeks    Status On-going                   Plan - 02/19/21 2297     Clinical Impression Statement Pt continued to progress exercises. Pt reported a decrease in pain as he perfomed his exercises. He required less cues for eccentric control on leg curls and knee extension. We ambulated outside. Pt required frequent rest breaks and had trouble going up hill, espeically with leaning forward to push himself uphill.    PT Treatment/Interventions ADLs/Self Care Home Management;Aquatic Therapy;Moist Heat;Functional mobility training;Therapeutic activities;Therapeutic exercise;Balance training;Neuromuscular re-education;Manual techniques;Dry needling;Patient/family education    PT Next Visit Plan progress with strengthening and functional endurance    PT Home Exercise Plan [id number]             Patient will benefit from skilled therapeutic intervention in order to improve the following deficits and impairments:  Decreased range of motion, Pain, Impaired flexibility, Postural dysfunction, Abnormal gait  Visit Diagnosis: Dysarthria and anarthria  Chronic pain of right knee  Chronic pain of left knee     Problem List Patient  Active Problem List   Diagnosis Date Noted   IFG (impaired fasting glucose) 10/23/2020   OAB (overactive bladder) 10/20/2020   Abnormal weight loss 02/09/2020   Anorexia 02/09/2020   Combined forms of age-related cataract of left eye 09/23/2019   Primary osteoarthritis of left knee 04/22/2019   Slurred speech 01/28/2019   Urinary urgency 01/28/2019   CKD stage G3a/A1, GFR 45-59 and albumin creatinine ratio <30 mg/g (HCC) 06/05/2018   Migraine 09/08/2013   Low back pain 09/08/2013   Hyperlipidemia 12/31/2010   GERD 11/08/2009   HYPERTENSION 04/22/2008    [name], SPTA 02/19/2021, 9:10 AM  [facility] [address] [place], [place], [address] Phone: [phone]   Fax:  [phone]  Name: Jose Waller MRN: [id number] Date of Birth: 01-22-1951

## 2021-02-21 ENCOUNTER — Ambulatory Visit: Payer: Medicare Other | Admitting: Speech Pathology

## 2021-02-21 ENCOUNTER — Other Ambulatory Visit: Payer: Self-pay

## 2021-02-21 ENCOUNTER — Encounter: Payer: Self-pay | Admitting: Physical Therapy

## 2021-02-21 ENCOUNTER — Ambulatory Visit: Payer: Medicare Other | Admitting: Physical Therapy

## 2021-02-21 ENCOUNTER — Encounter: Payer: Self-pay | Admitting: Speech Pathology

## 2021-02-21 DIAGNOSIS — R471 Dysarthria and anarthria: Secondary | ICD-10-CM

## 2021-02-21 DIAGNOSIS — G8929 Other chronic pain: Secondary | ICD-10-CM

## 2021-02-21 DIAGNOSIS — M25562 Pain in left knee: Secondary | ICD-10-CM

## 2021-02-21 NOTE — Therapy (Signed)
[facility] [address] [place], [place], [address] Phone: [phone]   Fax:  [phone]  Physical Therapy Treatment  Patient Details  Name: Jose Waller MRN: [id number] Date of Birth: [birth year] Referring Provider (PT): Nani Gasser MD   Encounter Date: 02/21/2021   PT End of Session - 02/21/21 1340     Visit Number 9    Date for PT Re-Evaluation 03/08/21    Authorization Type HUMANA MCR    PT Start Time 1300    PT Stop Time 1341    PT Time Calculation (min) 41 min    Activity Tolerance Patient tolerated treatment well    Behavior During Therapy [facility] for tasks assessed/performed             Past Medical History:  Diagnosis Date   Allergy    Arthritis    Cataract    MD just watching   CKD (chronic kidney disease) stage 3, GFR 30-59 ml/min (HCC) 06/05/2018   Cluster headache    resolved   Glaucoma    Hypertension    Substance abuse (HCC)    cocaine - quit 1998    Past Surgical History:  Procedure Laterality Date   back lumbar  2004   fusion    COLONOSCOPY  06/09/2009   Dr Mellody Memos - normal   WISDOM TOOTH EXTRACTION      There were no vitals filed for this visit.   Subjective Assessment - 02/21/21 1302     Subjective "I feel pretty good"    Currently in Pain? No/denies                               [facility] Adult PT Treatment/Exercise - 02/21/21 0001       Knee/Hip Exercises: Aerobic   Recumbent Bike L2.5 x 6  min      Knee/Hip Exercises: Machines for Strengthening   Cybex Knee Flexion 25# 2x10    Other Machine resisted gait 30# 4 way x 3 each      Knee/Hip Exercises: Standing   Heel Raises Both;2 sets;10 reps    Forward Step Up Both;1 set;10 reps;Hand Hold: 0;Step Height: 6"      Knee/Hip Exercises: Seated   Other Seated Knee/Hip Exercises Rows & Lats 25lb 2x10    Sit to Sand 2 sets;10 reps;without UE support   OHP yellow ball                         PT Long Term Goals - 02/19/21 0909       PT LONG TERM GOAL #1   Title Ind and compliant with HEP and progression    Time 8    Period Weeks    Status On-going      PT LONG TERM GOAL #2   Title Patient to demo improved left knee extension to -5 or better to help normalize gait.    Time 8    Period Weeks      PT LONG TERM GOAL #3   Title Patient to report decreased frequency and intensity of knee pain with amb after sitting by >= 50%.    Time 8    Period Weeks    Status On-going                   Plan - 02/21/21 1341     Clinical Impression Statement  Pt did well with a moe intense session. Pt reports some hip fatigue and weakness with resisted sides steps. Some increase in L knee pain during eccentric phase of step downs. Cues for pacing needed with sit to stands. Added seated rows and lats without issue.    Personal Factors and Comorbidities Comorbidity 3+    Comorbidities OA bil knees, HTN, CKD, CVA    Examination-Activity Limitations Locomotion Level    Stability/Clinical Decision Making Stable/Uncomplicated    Rehab Potential Good    PT Frequency 2x / week    PT Duration 8 weeks    PT Treatment/Interventions ADLs/Self Care Home Management;Aquatic Therapy;Moist Heat;Functional mobility training;Therapeutic activities;Therapeutic exercise;Balance training;Neuromuscular re-education;Manual techniques;Dry needling;Patient/family education    PT Next Visit Plan progress with strengthening and functional endurance             Patient will benefit from skilled therapeutic intervention in order to improve the following deficits and impairments:  Decreased range of motion, Pain, Impaired flexibility, Postural dysfunction, Abnormal gait  Visit Diagnosis: Chronic pain of right knee  Chronic pain of left knee     Problem List Patient Active Problem List   Diagnosis Date Noted   IFG (impaired fasting glucose) 10/23/2020   OAB  (overactive bladder) 10/20/2020   Abnormal weight loss 02/09/2020   Anorexia 02/09/2020   Combined forms of age-related cataract of left eye 09/23/2019   Primary osteoarthritis of left knee 04/22/2019   Slurred speech 01/28/2019   Urinary urgency 01/28/2019   CKD stage G3a/A1, GFR 45-59 and albumin creatinine ratio <30 mg/g (HCC) 06/05/2018   Migraine 09/08/2013   Low back pain 09/08/2013   Hyperlipidemia 12/31/2010   GERD 11/08/2009   HYPERTENSION 04/22/2008    Grayce Sessions 02/21/2021, 1:43 PM  [facility] [address] [place], [place], [address] Phone: [phone]   Fax:  [phone]  Name: Jose Waller MRN: [id number] Date of Birth: 08-12-1950

## 2021-02-21 NOTE — Therapy (Signed)
[place]. [place], [place], [address] Phone: [phone]   Fax:  [phone]  Speech Language Pathology Treatment & Discharge  Patient Details  Name: Jose Waller MRN: [id number] Date of Birth: 1950/11/08 Referring Provider (SLP): Hali Marry, MD   Encounter Date: 02/21/2021   End of Session - 02/21/21 1400     Visit Number 4    Number of Visits 5    Date for SLP Re-Evaluation 04/03/21    SLP Start Time 1357    SLP Stop Time  1438    SLP Time Calculation (min) 41 min    Activity Tolerance Patient tolerated treatment well             Past Medical History:  Diagnosis Date   Allergy    Arthritis    Cataract    MD just watching   CKD (chronic kidney disease) stage 3, GFR 30-59 ml/min ([place]) 06/05/2018   Cluster headache    resolved   Glaucoma    Hypertension    Substance abuse ([place])    cocaine - quit 1998    Past Surgical History:  Procedure Laterality Date   back lumbar  2004   fusion    COLONOSCOPY  06/09/2009   Dr Juleen Starr - normal   WISDOM TOOTH EXTRACTION      There were no vitals filed for this visit.   Subjective Assessment - 02/21/21 1359     Subjective Pt reports he is doing much better.    Currently in Pain? No/denies                   ADULT SLP TREATMENT - 02/21/21 1406       General Information   Behavior/Cognition Alert;Cooperative;Pleasant mood      Treatment Provided   Treatment provided Cognitive-Linquistic      Cognitive-Linquistic Treatment   Treatment focused on Dysarthria    Skilled Treatment Re-edu on environmental modifications, slow rate strateies, and articulation strategies. Pt was able to provide examples and demonstrate use in conversation. Pt was provided a topic and instructed to use his "clearest" speech.      Assessment / Recommendations / Plan   Plan Continue with current plan of care      Progression Toward Goals    Progression toward goals Progressing toward goals              SLP Education - 02/21/21 1359     Education Details Continued edu on dysarthria strategies.    Person(s) Educated Patient    Methods Explanation;Demonstration;Handout    Comprehension Verbalized understanding;Returned demonstration              SLP Short Term Goals - 02/21/21 1403       SLP SHORT TERM GOAL #1   Title Pt will demonstate use of overarticulation at sentence level with <2 verbal cues per 10 sentences.    Time 2    Period Weeks    Status Achieved    Target Date 02/14/21      SLP SHORT TERM GOAL #2   Title Pt will demonstate use of slowed rate at sentence level with <2 verbal cues per 10 sentences.    Time 2    Period Weeks    Status Achieved    Target Date 02/14/21      SLP SHORT TERM GOAL #3   Title Pt will demonstate use of increased loudness at sentence level with <2  verbal cues per 10 sentences.    Time 2    Period Weeks    Status Achieved    Target Date 02/14/21              SLP Long Term Goals - 02/21/21 1404       SLP LONG TERM GOAL #1   Title Pt will demonstrate independent use of intelligibility strategies (over-articulation, slow rate, increased intensity etc) in conversation over 3 sessions.    Time 4    Period Weeks    Status Achieved              Plan - 02/21/21 1400     Clinical Impression Statement Pt has made noticeable improvements in speech. Occasionally requires verbal cueing.    Speech Therapy Frequency 1x /week    Duration 4 weeks    Treatment/Interventions Compensatory strategies;Cueing hierarchy;Functional tasks;Patient/family education;SLP instruction and feedback;Internal/external aids;Compensatory techniques    Potential to Achieve Goals Good    Consulted and Agree with Plan of Care Patient             Patient will benefit from skilled therapeutic intervention in order to improve the following deficits and impairments:   Dysarthria and  anarthria    Problem List Patient Active Problem List   Diagnosis Date Noted   IFG (impaired fasting glucose) 10/23/2020   OAB (overactive bladder) 10/20/2020   Abnormal weight loss 02/09/2020   Anorexia 02/09/2020   Combined forms of age-related cataract of left eye 09/23/2019   Primary osteoarthritis of left knee 04/22/2019   Slurred speech 01/28/2019   Urinary urgency 01/28/2019   CKD stage G3a/A1, GFR 45-59 and albumin creatinine ratio <30 mg/g ([place]) 06/05/2018   Migraine 09/08/2013   Low back pain 09/08/2013   Hyperlipidemia 12/31/2010   GERD 11/08/2009   HYPERTENSION 04/22/2008   SPEECH THERAPY DISCHARGE SUMMARY  Visits from Start of Care: 4  Current functional level related to goals / functional outcomes: Met all goals.    Remaining deficits: Continued slurred speech, but is able to compensate using strategies in conversation.   Education / Equipment: Dysarthria, speech intelligibility strateies.    Patient agrees to discharge. Patient goals were met. Patient is being discharged due to meeting the stated rehab goals.Verdene Lennert MS, [place], CBIS  02/21/2021, 2:29 PM  [place]. [place], [place], [address] Phone: [phone]   Fax:  [phone]   Name: Jose Waller MRN: [id number] Date of Birth: [birth year]

## 2021-02-27 ENCOUNTER — Other Ambulatory Visit: Payer: Self-pay

## 2021-02-27 ENCOUNTER — Ambulatory Visit: Payer: Medicare Other | Attending: Family Medicine | Admitting: Physical Therapy

## 2021-02-27 ENCOUNTER — Encounter: Payer: Self-pay | Admitting: Physical Therapy

## 2021-02-27 DIAGNOSIS — M25561 Pain in right knee: Secondary | ICD-10-CM | POA: Insufficient documentation

## 2021-02-27 DIAGNOSIS — G8929 Other chronic pain: Secondary | ICD-10-CM | POA: Insufficient documentation

## 2021-02-27 DIAGNOSIS — M25562 Pain in left knee: Secondary | ICD-10-CM | POA: Diagnosis not present

## 2021-02-27 NOTE — Therapy (Signed)
[name]. [place], [place], [address] Phone: [phone]   Fax:  [phone] Progress Note Reporting Period 01/30/21 to 02/27/21 for the first 10 visits  See note below for Objective Data and Assessment of Progress/Goals.     Physical Therapy Treatment  Patient Details  Name: Jose [place] MRN: [id number] Date of Birth: 1951/06/28 Referring Provider (PT): Beatrice Lecher MD   Encounter Date: 02/27/2021   PT End of Session - 02/27/21 0916     Visit Number 10    Number of Visits 12    Authorization Type HUMANA MCR    PT Start Time 5176    PT Stop Time 0920    PT Time Calculation (min) 45 min             Past Medical History:  Diagnosis Date   Allergy    Arthritis    Cataract    MD just watching   CKD (chronic kidney disease) stage 3, GFR 30-59 ml/min ([place]) 06/05/2018   Cluster headache    resolved   Glaucoma    Hypertension    Substance abuse ([place])    cocaine - quit 1998    Past Surgical History:  Procedure Laterality Date   back lumbar  2004   fusion    COLONOSCOPY  06/09/2009   Dr Juleen Starr - normal   WISDOM TOOTH EXTRACTION      There were no vitals filed for this visit.   Subjective Assessment - 02/27/21 0837     Subjective "Not feeling too good today" "Some times I feel like this in the morning time"                [facility] PT Assessment - 02/27/21 0001       AROM   Left Knee Extension 7                           [facility] Adult PT Treatment/Exercise - 02/27/21 0001       Knee/Hip Exercises: Machines for Strengthening   Cybex Knee Extension 10# 2x10    Cybex Knee Flexion 25# 2x12    Cybex Leg Press 30lb 2x10    Other Machine resisted gait 30# side step x3 each      Knee/Hip Exercises: Standing   Lateral Step Up Both;Hand Hold: 0;Step Height: 6";1 set;10 reps      Knee/Hip Exercises: Seated   Sit to Sand 2 sets;10 reps;without UE support   OHP yellow  ball                        PT Long Term Goals - 02/27/21 0841       PT LONG TERM GOAL #1   Title Ind and compliant with HEP and progression    Status Achieved      PT LONG TERM GOAL #3   Title Patient to report decreased frequency and intensity of knee pain with amb after sitting by >= 50%.    Status Partially Met                   Plan - 02/27/21 0916     Clinical Impression Statement Pt has progressed towards goal reporting less knee pain and increasing his knee extension. Despite the increase in extension he is still lacking full extension. Hip weakness and instability noted with sit to stands. Cues needed to full extend  LE with extensions and to hold a contraction at end range. CGA needed with lateral step ups.    Personal Factors and Comorbidities Comorbidity 3+    Comorbidities OA bil knees, HTN, CKD, CVA    Examination-Activity Limitations Locomotion Level    Stability/Clinical Decision Making Stable/Uncomplicated    Rehab Potential Good    PT Frequency 2x / week    PT Duration 8 weeks    PT Treatment/Interventions ADLs/Self Care Home Management;Aquatic Therapy;Moist Heat;Functional mobility training;Therapeutic activities;Therapeutic exercise;Balance training;Neuromuscular re-education;Manual techniques;Dry needling;Patient/family education    PT Next Visit Plan progress with strengthening and functional endurance             Patient will benefit from skilled therapeutic intervention in order to improve the following deficits and impairments:  Decreased range of motion, Pain, Impaired flexibility, Postural dysfunction, Abnormal gait  Visit Diagnosis: Chronic pain of left knee  Chronic pain of right knee     Problem List Patient Active Problem List   Diagnosis Date Noted   IFG (impaired fasting glucose) 10/23/2020   OAB (overactive bladder) 10/20/2020   Abnormal weight loss 02/09/2020   Anorexia 02/09/2020   Combined forms of  age-related cataract of left eye 09/23/2019   Primary osteoarthritis of left knee 04/22/2019   Slurred speech 01/28/2019   Urinary urgency 01/28/2019   CKD stage G3a/A1, GFR 45-59 and albumin creatinine ratio <30 mg/g (HCC) 06/05/2018   Migraine 09/08/2013   Low back pain 09/08/2013   Hyperlipidemia 12/31/2010   GERD 11/08/2009   HYPERTENSION 04/22/2008    [name] 02/27/2021, 9:20 AM  [place] [place], [place], [address] Phone: [phone]   Fax:  [phone]  Name: Jose [place]er MRN: [id number] Date of Birth: 1950/12/21

## 2021-02-28 ENCOUNTER — Encounter: Payer: Self-pay | Admitting: Physical Therapy

## 2021-02-28 ENCOUNTER — Ambulatory Visit: Payer: Medicare Other | Admitting: Physical Therapy

## 2021-02-28 DIAGNOSIS — G8929 Other chronic pain: Secondary | ICD-10-CM | POA: Diagnosis not present

## 2021-02-28 DIAGNOSIS — M25562 Pain in left knee: Secondary | ICD-10-CM | POA: Diagnosis not present

## 2021-02-28 DIAGNOSIS — M25561 Pain in right knee: Secondary | ICD-10-CM | POA: Diagnosis not present

## 2021-02-28 NOTE — Therapy (Signed)
[place]. [place], [place], [address] Phone: [phone]   Fax:  [phone]  Physical Therapy Treatment  Patient Details  Name: Jose Waller MRN: [id number] Date of Birth: 11-May-1951 Referring Provider (PT): Beatrice Lecher MD   Encounter Date: 02/28/2021   PT End of Session - 02/28/21 0959     Visit Number 11    Number of Visits 12    Date for PT Re-Evaluation 03/08/21    Authorization Type HUMANA MCR    PT Start Time 0920    PT Stop Time 1000    PT Time Calculation (min) 40 min    Activity Tolerance Patient tolerated treatment well    Behavior During Therapy [facility] for tasks assessed/performed             Past Medical History:  Diagnosis Date   Allergy    Arthritis    Cataract    MD just watching   CKD (chronic kidney disease) stage 3, GFR 30-59 ml/min ([place]) 06/05/2018   Cluster headache    resolved   Glaucoma    Hypertension    Substance abuse ([place])    cocaine - quit 1998    Past Surgical History:  Procedure Laterality Date   back lumbar  2004   fusion    COLONOSCOPY  06/09/2009   Dr Juleen Starr - normal   WISDOM TOOTH EXTRACTION      There were no vitals filed for this visit.   Subjective Assessment - 02/28/21 0921     Subjective "I think you helped me pushing down on that knee"    Currently in Pain? No/denies                               [facility] Adult PT Treatment/Exercise - 02/28/21 0001       Knee/Hip Exercises: Aerobic   Nustep L5x6 min      Knee/Hip Exercises: Machines for Strengthening   Cybex Knee Extension 10# 2x10; LLE 5lb x10    Cybex Knee Flexion 25# 2x12    Cybex Leg Press 40lb 3x10      Knee/Hip Exercises: Standing   Forward Step Up Both;1 set;10 reps;Hand Hold: 0;Step Height: 6"      Knee/Hip Exercises: Seated   Sit to Sand 2 sets;10 reps;without UE support   OHP yellow ball     Manual Therapy   Manual Therapy Passive ROM    Passive  ROM L knee Extension                         PT Long Term Goals - 02/27/21 0841       PT LONG TERM GOAL #1   Title Ind and compliant with HEP and progression    Status Achieved      PT LONG TERM GOAL #3   Title Patient to report decreased frequency and intensity of knee pain with amb after sitting by >= 50%.    Status Partially Met                   Plan - 02/28/21 1001     Clinical Impression Statement No issues with today's interventions but pt did have some increase in fatigue. Pt had a positive response to passive L knee extension stretch, reporting it felt better. Some LLE weakness present with step ups, pt unable to fully extend LLE  when stepping up on to box. Increase reports tolerated on leg press, cues to focus on L knee extension.    Personal Factors and Comorbidities Comorbidity 3+    Comorbidities OA bil knees, HTN, CKD, CVA    Examination-Activity Limitations Locomotion Level    Stability/Clinical Decision Making Stable/Uncomplicated    PT Frequency 2x / week    PT Duration 8 weeks    PT Treatment/Interventions ADLs/Self Care Home Management;Aquatic Therapy;Moist Heat;Functional mobility training;Therapeutic activities;Therapeutic exercise;Balance training;Neuromuscular re-education;Manual techniques;Dry needling;Patient/family education    PT Next Visit Plan progress with strengthening and functional endurance             Patient will benefit from skilled therapeutic intervention in order to improve the following deficits and impairments:  Decreased range of motion, Pain, Impaired flexibility, Postural dysfunction, Abnormal gait  Visit Diagnosis: Chronic pain of right knee  Chronic pain of left knee     Problem List Patient Active Problem List   Diagnosis Date Noted   IFG (impaired fasting glucose) 10/23/2020   OAB (overactive bladder) 10/20/2020   Abnormal weight loss 02/09/2020   Anorexia 02/09/2020   Combined forms of  age-related cataract of left eye 09/23/2019   Primary osteoarthritis of left knee 04/22/2019   Slurred speech 01/28/2019   Urinary urgency 01/28/2019   CKD stage G3a/A1, GFR 45-59 and albumin creatinine ratio <30 mg/g (HCC) 06/05/2018   Migraine 09/08/2013   Low back pain 09/08/2013   Hyperlipidemia 12/31/2010   GERD 11/08/2009   HYPERTENSION 04/22/2008    [name] 02/28/2021, 10:05 AM  [place]. [place], [place], [address] Phone: [phone]   Fax:  [phone]  Name: Jose [place]er MRN: [id number] Date of Birth: [birth year]

## 2021-03-02 ENCOUNTER — Other Ambulatory Visit: Payer: Self-pay | Admitting: Family Medicine

## 2021-03-05 ENCOUNTER — Ambulatory Visit: Payer: Medicare Other | Admitting: Physical Therapy

## 2021-03-07 ENCOUNTER — Ambulatory Visit: Payer: Medicare Other | Admitting: Physical Therapy

## 2021-03-07 ENCOUNTER — Encounter: Payer: Self-pay | Admitting: Physical Therapy

## 2021-03-07 ENCOUNTER — Other Ambulatory Visit: Payer: Self-pay

## 2021-03-07 DIAGNOSIS — G8929 Other chronic pain: Secondary | ICD-10-CM | POA: Diagnosis not present

## 2021-03-07 DIAGNOSIS — M25562 Pain in left knee: Secondary | ICD-10-CM | POA: Diagnosis not present

## 2021-03-07 DIAGNOSIS — M25561 Pain in right knee: Secondary | ICD-10-CM | POA: Diagnosis not present

## 2021-03-07 NOTE — Therapy (Signed)
[place]. [place], [place], [address] Phone: [phone]   Fax:  [phone]  Physical Therapy Treatment  Patient Details  Name: Jose [place] MRN: [id number] Date of Birth: [birth year] Referring Provider (PT): Beatrice Lecher MD   Encounter Date: 03/07/2021   PT End of Session - 03/07/21 1006     Visit Number 12    Date for PT Re-Evaluation 03/08/21    Authorization Type HUMANA MCR    PT Start Time 0930    PT Stop Time 1010    PT Time Calculation (min) 40 min    Activity Tolerance Patient tolerated treatment well    Behavior During Therapy [facility] for tasks assessed/performed             Past Medical History:  Diagnosis Date   Allergy    Arthritis    Cataract    MD just watching   CKD (chronic kidney disease) stage 3, GFR 30-59 ml/min ([place]) 06/05/2018   Cluster headache    resolved   Glaucoma    Hypertension    Substance abuse ([place])    cocaine - quit 1998    Past Surgical History:  Procedure Laterality Date   back lumbar  2004   fusion    COLONOSCOPY  06/09/2009   Dr Juleen Starr - normal   WISDOM TOOTH EXTRACTION      There were no vitals filed for this visit.   Subjective Assessment - 03/07/21 0932     Subjective "I got to go" "got to keep going"    Currently in Pain? No/denies                               [facility] Adult PT Treatment/Exercise - 03/07/21 0001       Knee/Hip Exercises: Aerobic   Nustep L5x6 min      Knee/Hip Exercises: Machines for Strengthening   Cybex Knee Extension 10# 2x12    Cybex Knee Flexion 25# 2x12    Cybex Leg Press 40lb 3x10    Other Machine resisted gait 40# side step x3 each      Knee/Hip Exercises: Standing   Forward Step Up Both;1 set;10 reps;Hand Hold: 0;Step Height: 6"    Other Standing Knee Exercises Shoulder Ext 10lb 2x10      Knee/Hip Exercises: Seated   Sit to Sand 1 set;15 reps;without UE support   OHP [place] wball                         PT Long Term Goals - 03/07/21 1008       PT LONG TERM GOAL #2   Title Patient to demo improved left knee extension to -5 or better to help normalize gait.    Status On-going      PT LONG TERM GOAL #3   Title Patient to report decreased frequency and intensity of knee pain with amb after sitting by >= 50%.    Status Partially Met                   Plan - 03/07/21 1008     Clinical Impression Statement Pt able to complete all interventions. Fatigue noted with the increase in resistance with resisted gait. L knee pain reported with step ups. Cue to hold contraction with leg curls and extensions. Tactile cues for posture needed with standing shoulder extensions.  Personal Factors and Comorbidities Comorbidity 3+    Comorbidities OA bil knees, HTN, CKD, CVA    Examination-Activity Limitations Locomotion Level    Stability/Clinical Decision Making Stable/Uncomplicated    PT Frequency 2x / week    PT Duration 8 weeks    PT Treatment/Interventions ADLs/Self Care Home Management;Aquatic Therapy;Moist Heat;Functional mobility training;Therapeutic activities;Therapeutic exercise;Balance training;Neuromuscular re-education;Manual techniques;Dry needling;Patient/family education    PT Next Visit Plan progress with strengthening and functional endurance, Re-evaluation             Patient will benefit from skilled therapeutic intervention in order to improve the following deficits and impairments:  Decreased range of motion, Pain, Impaired flexibility, Postural dysfunction, Abnormal gait  Visit Diagnosis: Chronic pain of left knee  Chronic pain of right knee     Problem List Patient Active Problem List   Diagnosis Date Noted   IFG (impaired fasting glucose) 10/23/2020   OAB (overactive bladder) 10/20/2020   Abnormal weight loss 02/09/2020   Anorexia 02/09/2020   Combined forms of age-related cataract of left eye 09/23/2019   Primary  osteoarthritis of left knee 04/22/2019   Slurred speech 01/28/2019   Urinary urgency 01/28/2019   CKD stage G3a/A1, GFR 45-59 and albumin creatinine ratio <30 mg/g (HCC) 06/05/2018   Migraine 09/08/2013   Low back pain 09/08/2013   Hyperlipidemia 12/31/2010   GERD 11/08/2009   HYPERTENSION 04/22/2008    [name] 03/07/2021, 10:11 AM  [place]. [place], [place], [address] Phone: [phone]   Fax:  [phone]  Name: Jose [place]er MRN: [id number] Date of Birth: 1951-06-15

## 2021-03-12 ENCOUNTER — Ambulatory Visit: Payer: Medicare Other | Admitting: Rehabilitative and Restorative Service Providers"

## 2021-03-14 ENCOUNTER — Ambulatory Visit: Payer: Medicare Other | Admitting: Physical Therapy

## 2021-03-19 ENCOUNTER — Ambulatory Visit: Payer: Medicare Other | Admitting: Physical Therapy

## 2021-03-19 ENCOUNTER — Other Ambulatory Visit: Payer: Self-pay

## 2021-03-19 ENCOUNTER — Encounter: Payer: Self-pay | Admitting: Physical Therapy

## 2021-03-19 DIAGNOSIS — M25561 Pain in right knee: Secondary | ICD-10-CM | POA: Diagnosis not present

## 2021-03-19 DIAGNOSIS — G8929 Other chronic pain: Secondary | ICD-10-CM

## 2021-03-19 DIAGNOSIS — M25562 Pain in left knee: Secondary | ICD-10-CM | POA: Diagnosis not present

## 2021-03-19 NOTE — Therapy (Signed)
[place]. [place], [place], [address] Phone: [phone]   Fax:  [phone]  Physical Therapy Treatment  Patient Details  Name: JAECEON MICHELIN MRN: [id number] Date of Birth: June 20, 1951 Referring Provider (PT): Beatrice Lecher MD   Encounter Date: 03/19/2021   PT End of Session - 03/19/21 0916     Visit Number 13    Date for PT Re-Evaluation 03/08/21    Authorization Type HUMANA MCR    PT Start Time 0840    PT Stop Time 0915    PT Time Calculation (min) 35 min    Activity Tolerance Patient tolerated treatment well    Behavior During Therapy [facility] for tasks assessed/performed             Past Medical History:  Diagnosis Date   Allergy    Arthritis    Cataract    MD just watching   CKD (chronic kidney disease) stage 3, GFR 30-59 ml/min ([place]) 06/05/2018   Cluster headache    resolved   Glaucoma    Hypertension    Substance abuse ([place])    cocaine - quit 1998    Past Surgical History:  Procedure Laterality Date   back lumbar  2004   fusion    COLONOSCOPY  06/09/2009   Dr Juleen Starr - normal   WISDOM TOOTH EXTRACTION      There were no vitals filed for this visit.   Subjective Assessment - 03/19/21 0839     Subjective "I feel good"    Pertinent History OA bil knees, HTN, CKD, CVA    Diagnostic tests OA tricompartmental bil medial worst on left; chondrocacinosis on Rt.    Currently in Pain? No/denies                [facility] PT Assessment - 03/19/21 0001       AROM   Left Knee Extension 6                           [facility] Adult PT Treatment/Exercise - 03/19/21 0001       Knee/Hip Exercises: Aerobic   Nustep L5x6 min      Knee/Hip Exercises: Machines for Strengthening   Cybex Knee Extension 10# 2x12    Cybex Knee Flexion 25# 2x12    Cybex Leg Press 50lb 3x10      Knee/Hip Exercises: Standing   Forward Step Up Both;1 set;10 reps;Hand Hold: 0;Step Height: 6"                Sit to stands holding blue 8lb ball 2x10          PT Long Term Goals - 03/19/21 0919       PT LONG TERM GOAL #2   Status Achieved                   Plan - 03/19/21 0916     Clinical Impression Statement Pt has progressed meeting majority of his go las. Pt still lacks some degrees of knee extension. No issus with today's interventions. Pt reports no functional limitations and is pleased with his current functional status.    Personal Factors and Comorbidities Comorbidity 3+    Comorbidities OA bil knees, HTN, CKD, CVA    Examination-Activity Limitations Locomotion Level    Stability/Clinical Decision Making Stable/Uncomplicated    Rehab Potential Good    PT Frequency 2x / week  PT Duration 8 weeks    PT Treatment/Interventions ADLs/Self Care Home Management;Aquatic Therapy;Moist Heat;Functional mobility training;Therapeutic activities;Therapeutic exercise;Balance training;Neuromuscular re-education;Manual techniques;Dry needling;Patient/family education    PT Next Visit Plan D/C PT             Patient will benefit from skilled therapeutic intervention in order to improve the following deficits and impairments:  Decreased range of motion, Pain, Impaired flexibility, Postural dysfunction, Abnormal gait  Visit Diagnosis: Chronic pain of right knee     Problem List Patient Active Problem List   Diagnosis Date Noted   IFG (impaired fasting glucose) 10/23/2020   OAB (overactive bladder) 10/20/2020   Abnormal weight loss 02/09/2020   Anorexia 02/09/2020   Combined forms of age-related cataract of left eye 09/23/2019   Primary osteoarthritis of left knee 04/22/2019   Slurred speech 01/28/2019   Urinary urgency 01/28/2019   CKD stage G3a/A1, GFR 45-59 and albumin creatinine ratio <30 mg/g ([place]) 06/05/2018   Migraine 09/08/2013   Low back pain 09/08/2013   Hyperlipidemia 12/31/2010   GERD 11/08/2009   HYPERTENSION 04/22/2008    PHYSICAL THERAPY DISCHARGE SUMMARY  Visits from Start of Care: 13    Patient agrees to discharge. Patient goals were partially met. Patient is being discharged due to being pleased with the current functional level.   [name], PTA 03/19/2021, 9:20 AM  [place]. [place], [place], [address] Phone: [phone]   Fax:  [phone]  Name: MAVRYK PINO MRN: [id number] Date of Birth: Sep 22, 1950

## 2021-03-26 ENCOUNTER — Other Ambulatory Visit: Payer: Self-pay | Admitting: Family Medicine

## 2021-03-26 DIAGNOSIS — I10 Essential (primary) hypertension: Secondary | ICD-10-CM

## 2021-04-03 ENCOUNTER — Other Ambulatory Visit: Payer: Self-pay | Admitting: Family Medicine

## 2021-04-04 ENCOUNTER — Other Ambulatory Visit: Payer: Self-pay | Admitting: *Deleted

## 2021-04-04 DIAGNOSIS — N3281 Overactive bladder: Secondary | ICD-10-CM

## 2021-04-04 MED ORDER — DARIFENACIN HYDROBROMIDE ER 15 MG PO TB24
15.0000 mg | ORAL_TABLET | Freq: Every day | ORAL | 2 refills | Status: DC
Start: 2021-04-04 — End: 2021-10-22

## 2021-04-12 ENCOUNTER — Other Ambulatory Visit: Payer: Self-pay | Admitting: Family Medicine

## 2021-04-12 DIAGNOSIS — K219 Gastro-esophageal reflux disease without esophagitis: Secondary | ICD-10-CM

## 2021-04-16 ENCOUNTER — Telehealth: Payer: Self-pay | Admitting: Family Medicine

## 2021-04-16 NOTE — Telephone Encounter (Signed)
Left message for patient to call back and schedule Medicare Annual Wellness Visit (AWV) either phone or in office.   Last AWV  08/04/2018 ; please schedule at anytime with health coach

## 2021-04-23 ENCOUNTER — Encounter: Payer: Self-pay | Admitting: Family Medicine

## 2021-04-23 ENCOUNTER — Ambulatory Visit (INDEPENDENT_AMBULATORY_CARE_PROVIDER_SITE_OTHER): Payer: Medicare Other | Admitting: Family Medicine

## 2021-04-23 VITALS — BP 108/83 | HR 96 | Ht 66.0 in | Wt 202.0 lb

## 2021-04-23 DIAGNOSIS — E785 Hyperlipidemia, unspecified: Secondary | ICD-10-CM | POA: Diagnosis not present

## 2021-04-23 DIAGNOSIS — I10 Essential (primary) hypertension: Secondary | ICD-10-CM

## 2021-04-23 DIAGNOSIS — N3281 Overactive bladder: Secondary | ICD-10-CM

## 2021-04-23 DIAGNOSIS — R7301 Impaired fasting glucose: Secondary | ICD-10-CM | POA: Diagnosis not present

## 2021-04-23 DIAGNOSIS — N529 Male erectile dysfunction, unspecified: Secondary | ICD-10-CM

## 2021-04-23 HISTORY — DX: Male erectile dysfunction, unspecified: N52.9

## 2021-04-23 LAB — POCT GLYCOSYLATED HEMOGLOBIN (HGB A1C): Hemoglobin A1C: 6.1 % — AB (ref 4.0–5.6)

## 2021-04-23 MED ORDER — VARDENAFIL HCL 10 MG PO TABS: 10.0000 mg | ORAL_TABLET | Freq: Every day | ORAL | 5 refills | Status: DC | PRN

## 2021-04-23 MED ORDER — VALSARTAN 320 MG PO TABS: 320.0000 mg | ORAL_TABLET | Freq: Every day | ORAL | 1 refills | Status: DC

## 2021-04-23 MED ORDER — MELOXICAM 15 MG PO TABS: 15.0000 mg | ORAL_TABLET | Freq: Every day | ORAL | 2 refills | Status: DC

## 2021-04-23 NOTE — Assessment & Plan Note (Addendum)
BP is a little low today.  We discussed discontinuing the 12.5 mg of HCTZ and his valsartan and just taking the plain 320 mg valsartan daily.  Plan to follow-up in 6 weeks he is actually on a schedule Medicare wellness exam in the next month and can have the nurse check his blood pressure at that time.

## 2021-04-23 NOTE — Assessment & Plan Note (Signed)
A1c is up a little bit to 6.1.  Overall he is doing well he says for the first time in a long time he actually feels really good.  Just continue to work on cutting back on sweets.  And plan to recheck again in 6 months.

## 2021-04-23 NOTE — Assessment & Plan Note (Signed)
He would like to try something new.  He has tried sildenafil 20 mg tabs where he takes 2-5 tabs and is also tried Cialis both tend to cause headache.  The Cialis also cause nasal congestion.  So we will try Levitra this time.  Consider Jerral Ralph.

## 2021-04-23 NOTE — Assessment & Plan Note (Signed)
He says the Enablex is actually working really well he feels like its been more effective than the Vesicare in the [facility].

## 2021-04-23 NOTE — Assessment & Plan Note (Signed)
Due to recheck lipids.  Lab slip provided.  Encouraged him to go when he can fast

## 2021-04-23 NOTE — Progress Notes (Signed)
Established Patient Office Visit  Subjective:  Patient ID: Jose Waller, male    DOB: 04-02-1951  Age: 70 y.o. MRN: [id number]  CC:  Chief Complaint  Patient presents with   Hypertension    HPI Jose Waller presents for    Hypertension- Pt denies chest pain, SOB, dizziness, or heart palpitations.  Taking meds as directed w/o problems.  Denies medication side effects.    He has moved in with his daughter.  He is doing well. Enjoying his grandaughter who is one.    He has been going to physical therapy regularly for his right knee.  Follow-up overactive bladder he had already tried Vesicare and Myrbetriq and we decided to try Enablex when I last saw him.  We discussed possible urology referral if he did not feel the medication was helpful.  He did complete speech therapy and was actually able to give his speech this fall at his reunion.  He had a standing ovation and says he has been really excited about it.  Past Medical History:  Diagnosis Date   Allergy    Arthritis    Cataract    MD just watching   CKD (chronic kidney disease) stage 3, GFR 30-59 ml/min (HCC) 06/05/2018   Cluster headache    resolved   Glaucoma    Hypertension    Substance abuse ([place])    cocaine - quit 1998    Past Surgical History:  Procedure Laterality Date   back lumbar  2004   fusion    COLONOSCOPY  06/09/2009   Dr Juleen Starr - normal   WISDOM TOOTH EXTRACTION      Family History  Problem Relation Age of Onset   Lung cancer Father        lung/ was a miner   Alcohol abuse Other    Hypertension Brother    Dementia Mother    Coronary artery disease Brother    Colon cancer Neg Hx    Stomach cancer Neg Hx    Rectal cancer Neg Hx     Social History   Socioeconomic History   Marital status: Widowed    Spouse name: Not on file   Number of children: 2   Years of education: 17   Highest education level: 11th grade  Occupational History   Occupation: retired    Comment: Programmer, applications  Tobacco Use   Smoking status: Former    Types: Cigarettes    Quit date: 07/29/2001    Years since quitting: 19.7   Smokeless tobacco: Never  Vaping Use   Vaping Use: Never used  Substance and Sexual Activity   Alcohol use: No   Drug use: Not Currently    Comment: Former cocaine user, quit 1998   Sexual activity: Yes    Comment: married, 2 kids, on disability, regularly exercises.  Other Topics Concern   Not on file  Social History Narrative   On disability.  Some exercise. Wife passed away in a motor vehicle accident. He is raising 2 daughters by himself. Former drug addict. Likes to stay busy. Financial trader so travels looking for [employer]   Social Determinants of Health   Financial Resource Strain: Not on file  Food Insecurity: Not on file  Transportation Needs: Not on file  Physical Activity: Not on file  Stress: Not on file  Social Connections: Not on file  Intimate Partner Violence: Not on file    Outpatient Medications Prior to Visit  Medication Sig  Dispense Refill   atorvastatin (LIPITOR) 20 MG tablet TAKE 1 TABLET BY MOUTH AT BEDTIME 90 tablet 0   darifenacin (ENABLEX) 15 MG 24 hr tablet Take 1 tablet (15 mg total) by mouth daily. 30 tablet 2   diclofenac Sodium (VOLTAREN) 1 % GEL Apply 2 g topically 4 (four) times daily. To affected joint. 100 g 11   omeprazole (PRILOSEC) 40 MG capsule Take 1 capsule by mouth once daily 90 capsule 3   ibuprofen (ADVIL) 800 MG tablet TAKE 1 TABLET BY MOUTH EVERY 8 HOURS WITH FOOD AS NEEDED 90 tablet 0   sildenafil (REVATIO) 20 MG tablet TAKE 2 TO 5 TABLETS BY MOUTH ONCE DAILY AS NEEDED 20 tablet 0   valsartan-hydrochlorothiazide (DIOVAN-HCT) 320-12.5 MG tablet TAKE 1 TABLET BY MOUTH ONCE DAILY. THIS IS IN PLACE OF LISINOPRIL. 90 tablet 0   No facility-administered medications prior to visit.    Allergies  Allergen Reactions   Prednisone     REACTION: hick ups    ROS Review of Systems    Objective:    Physical  Exam Constitutional:      Appearance: Normal appearance. He is well-developed.  HENT:     Head: Normocephalic and atraumatic.  Cardiovascular:     Rate and Rhythm: Normal rate and regular rhythm.     Heart sounds: Normal heart sounds.  Pulmonary:     Effort: Pulmonary effort is normal.     Breath sounds: Normal breath sounds.  Skin:    General: Skin is warm and dry.  Neurological:     Mental Status: He is alert and oriented to person, place, and time. Mental status is at baseline.  Psychiatric:        Behavior: Behavior normal.    BP 108/83   Pulse 96   Ht 5' 6"  (1.676 m)   Wt 202 lb (91.6 kg)   SpO2 99%   BMI 32.60 kg/m  Wt Readings from Last 3 Encounters:  04/23/21 202 lb (91.6 kg)  02/19/21 202 lb (91.6 kg)  01/08/21 203 lb (92.1 kg)     There are no preventive care reminders to display for this patient.   There are no preventive care reminders to display for this patient.  Lab Results  Component Value Date   TSH 1.03 12/08/2020   Lab Results  Component Value Date   WBC 8.2 12/08/2020   HGB 16.8 12/08/2020   HCT 50.7 (H) 12/08/2020   MCV 85.9 12/08/2020   PLT 342 12/08/2020   Lab Results  Component Value Date   NA 141 02/19/2021   K 4.1 02/19/2021   CO2 28 02/19/2021   GLUCOSE 102 (H) 02/19/2021   BUN 18 02/19/2021   CREATININE 1.39 (H) 02/19/2021   BILITOT 0.5 03/23/2020   ALKPHOS 71 05/15/2016   AST 14 03/23/2020   ALT 10 03/23/2020   PROT 6.8 03/23/2020   ALBUMIN 4.1 05/15/2016   CALCIUM 9.6 02/19/2021   EGFR 54 (L) 02/19/2021   Lab Results  Component Value Date   CHOL 136 03/23/2020   Lab Results  Component Value Date   HDL 44 03/23/2020   Lab Results  Component Value Date   LDLCALC 77 03/23/2020   Lab Results  Component Value Date   TRIG 66 03/23/2020   Lab Results  Component Value Date   CHOLHDL 3.1 03/23/2020   Lab Results  Component Value Date   HGBA1C 6.1 (A) 04/23/2021      Assessment & Plan:   Problem  List  Items Addressed This Visit       Cardiovascular and Mediastinum   HYPERTENSION - Primary    BP is a little low today.  We discussed discontinuing the 12.5 mg of HCTZ and his valsartan and just taking the plain 320 mg valsartan daily.  Plan to follow-up in 6 weeks he is actually on a schedule Medicare wellness exam in the next month and can have the nurse check his blood pressure at that time.      Relevant Medications   vardenafil (LEVITRA) 10 MG tablet   valsartan (DIOVAN) 320 MG tablet   Other Relevant Orders   Lipid panel   COMPLETE METABOLIC PANEL WITH GFR     Endocrine   IFG (impaired fasting glucose)    A1c is up a little bit to 6.1.  Overall he is doing well he says for the first time in a long time he actually feels really good.  Just continue to work on cutting back on sweets.  And plan to recheck again in 6 months.      Relevant Orders   POCT glycosylated hemoglobin (Hb A1C) (Completed)     Genitourinary   OAB (overactive bladder)    He says the Enablex is actually working really well he feels like its been more effective than the Vesicare in the [facility].        Other   Hyperlipidemia    Due to recheck lipids.  Lab slip provided.  Encouraged him to go when he can fast      Relevant Medications   vardenafil (LEVITRA) 10 MG tablet   valsartan (DIOVAN) 320 MG tablet   Other Relevant Orders   Lipid panel   Erectile dysfunction    He would like to try something new.  He has tried sildenafil 20 mg tabs where he takes 2-5 tabs and is also tried Cialis both tend to cause headache.  The Cialis also cause nasal congestion.  So we will try Levitra this time.  Consider [place].      Relevant Medications   vardenafil (LEVITRA) 10 MG tablet    Meds ordered this encounter  Medications   vardenafil (LEVITRA) 10 MG tablet    Sig: Take 1 tablet (10 mg total) by mouth daily as needed for erectile dysfunction.    Dispense:  10 tablet    Refill:  5   meloxicam (MOBIC) 15  MG tablet    Sig: Take 1 tablet (15 mg total) by mouth daily.    Dispense:  30 tablet    Refill:  2   valsartan (DIOVAN) 320 MG tablet    Sig: Take 1 tablet (320 mg total) by mouth daily.    Dispense:  90 tablet    Refill:  1     Follow-up: Return in about 6 months (around 10/21/2021) for Hypertension.    Beatrice Lecher, MD

## 2021-04-25 ENCOUNTER — Telehealth: Payer: Self-pay

## 2021-04-25 NOTE — Telephone Encounter (Signed)
Medication: darifenacin (ENABLEX) 15 MG 24 hr tablet Prior authorization submitted via CoverMyMeds on 04/25/2021 PA submission pending

## 2021-04-27 ENCOUNTER — Ambulatory Visit (INDEPENDENT_AMBULATORY_CARE_PROVIDER_SITE_OTHER): Payer: Medicare Other | Admitting: Family Medicine

## 2021-04-27 DIAGNOSIS — Z0000 Encounter for general adult medical examination without abnormal findings: Secondary | ICD-10-CM | POA: Diagnosis not present

## 2021-04-27 NOTE — Progress Notes (Signed)
MEDICARE ANNUAL WELLNESS VISIT  04/27/2021  Telephone Visit Disclaimer This Medicare AWV was conducted by telephone due to national recommendations for restrictions regarding the COVID-19 Pandemic (e.g. social distancing).  I verified, using two identifiers, that I am speaking with Jose Waller or their authorized healthcare agent. I discussed the limitations, risks, security, and privacy concerns of performing an evaluation and management service by telephone and the potential availability of an in-person appointment in the future. The patient expressed understanding and agreed to proceed.  Location of Patient: Home Location of Provider (nurse):  In the office.  Subjective:    Jose Waller is a 70 y.o. male patient of Metheney, Barbarann Ehlers, MD who had a Medicare Annual Wellness Visit today via telephone. Jose Waller is Retired and lives alone. he has 5 children. he reports that he is socially active and does interact with friends/family regularly. he is minimally physically active and enjoys going to the flee market.  Patient Care Team: Agapito Games, MD as PCP - General  Advanced Directives 04/27/2021 01/11/2021 08/24/2018 04/19/2015  Does Patient Have a Medical Advance Directive? No No No No  Would patient like information on creating a medical advance directive? No - Patient declined No - Patient declined Yes (MAU/Ambulatory/Procedural Areas - Information given) Yes - Educational materials given    Hospital Utilization Over the Past 12 Months: # of hospitalizations or ER visits: 0 # of surgeries: 0  Review of Systems    Patient reports that his overall health is better compared to last year.  History obtained from chart review and the patient  Patient Reported Readings (BP, Pulse, CBG, Weight, etc) none  Pain Assessment Pain : No/denies pain     Current Medications & Allergies (verified) Allergies as of 04/27/2021       Reactions   Prednisone    REACTION: hick ups         Medication List        Accurate as of April 27, 2021  8:20 AM. If you have any questions, ask your nurse or doctor.          atorvastatin 20 MG tablet Commonly known as: LIPITOR TAKE 1 TABLET BY MOUTH AT BEDTIME   darifenacin 15 MG 24 hr tablet Commonly known as: Enablex Take 1 tablet (15 mg total) by mouth daily.   diclofenac Sodium 1 % Gel Commonly known as: VOLTAREN Apply 2 g topically 4 (four) times daily. To affected joint.   meloxicam 15 MG tablet Commonly known as: MOBIC Take 1 tablet (15 mg total) by mouth daily.   omeprazole 40 MG capsule Commonly known as: PRILOSEC Take 1 capsule by mouth once daily   valsartan 320 MG tablet Commonly known as: DIOVAN Take 1 tablet (320 mg total) by mouth daily.   vardenafil 10 MG tablet Commonly known as: Levitra Take 1 tablet (10 mg total) by mouth daily as needed for erectile dysfunction.        History (reviewed): Past Medical History:  Diagnosis Date   Allergy    Arthritis    Cataract    MD just watching   CKD (chronic kidney disease) stage 3, GFR 30-59 ml/min (HCC) 06/05/2018   Cluster headache    resolved   Glaucoma    Hypertension    Substance abuse (HCC)    cocaine - quit 1998   Past Surgical History:  Procedure Laterality Date   back lumbar  2004   fusion    COLONOSCOPY  06/09/2009  Dr Mellody Memos - normal   WISDOM TOOTH EXTRACTION     Family History  Problem Relation Age of Onset   Lung cancer Father        lung/ was a miner   Alcohol abuse Other    Hypertension Brother    Dementia Mother    Coronary artery disease Brother    Colon cancer Neg Hx    Stomach cancer Neg Hx    Rectal cancer Neg Hx    Social History   Socioeconomic History   Marital status: Widowed    Spouse name: Not on file   Number of children: 5   Years of education: 72   Highest education level: 12th grade  Occupational History   Occupation: retired    Comment: Technical sales engineer  Tobacco  Use   Smoking status: Former    Types: Cigarettes    Quit date: 07/29/2001    Years since quitting: 19.7   Smokeless tobacco: Never  Vaping Use   Vaping Use: Never used  Substance and Sexual Activity   Alcohol use: No   Drug use: Not Currently    Comment: Former cocaine user, quit 1998   Sexual activity: Yes  Other Topics Concern   Not on file  Social History Narrative   On disability. Wife passed away in a motor vehicle accident. He has five children. He enjoys going to the flea market.   Social Determinants of Health   Financial Resource Strain: Low Risk    Difficulty of Paying Living Expenses: Not hard at all  Food Insecurity: No Food Insecurity   Worried About Programme researcher, broadcasting/film/video in the Last Year: Never true   Ran Out of Food in the Last Year: Never true  Transportation Needs: No Transportation Needs   Lack of Transportation (Medical): No   Lack of Transportation (Non-Medical): No  Physical Activity: Inactive   Days of Exercise per Week: 0 days   Minutes of Exercise per Session: 0 min  Stress: No Stress Concern Present   Feeling of Stress : Not at all  Social Connections: Moderately Integrated   Frequency of Communication with Friends and Family: More than three times a week   Frequency of Social Gatherings with Friends and Family: Once a week   Attends Religious Services: More than 4 times per year   Active Member of [employer] or Organizations: Yes   Attends Banker Meetings: More than 4 times per year   Marital Status: Widowed    Activities of Daily Living In your present state of health, do you have any difficulty performing the following activities: 04/27/2021 11/08/2020  Hearing? N N  Vision? N N  Difficulty concentrating or making decisions? N N  Walking or climbing stairs? N N  Dressing or bathing? N N  Doing errands, shopping? N N  Preparing Food and eating ? N -  Using the Toilet? N -  In the past six months, have you accidently leaked urine? N -   Do you have problems with loss of bowel control? N -  Managing your Medications? N -  Managing your Finances? N -  Housekeeping or managing your Housekeeping? N -  Some recent data might be hidden    Patient Education/ Literacy How often do you need to have someone help you when you read instructions, pamphlets, or other written materials from your doctor or pharmacy?: 1 - Never What is the last grade level you completed in school?: 12th grade  Exercise Current Exercise Habits: The patient does not participate in regular exercise at present, Exercise limited by: None identified  Diet Patient reports consuming 3 meals a day and 2-3 snack(s) a day Patient reports that his primary diet is: Regular Patient reports that she does have regular access to food.   Depression Screen PHQ 2/9 Scores 04/27/2021 04/23/2021 02/19/2021 01/08/2021 10/20/2020 03/22/2020 02/09/2020  PHQ - 2 Score 0 0 0 0 0 0 3  PHQ- 9 Score - - - - - 1 10     Fall Risk Fall Risk  04/27/2021 04/23/2021 02/19/2021 01/08/2021 10/20/2020  Falls in the past year? 0 0 0 0 0  Number falls in past yr: 0 0 0 0 -  Injury with Fall? 0 0 0 0 -  Risk for fall due to : No Fall Risks No Fall Risks No Fall Risks No Fall Risks No Fall Risks  Follow up Falls evaluation completed Falls prevention discussed;Falls evaluation completed Falls prevention discussed;Falls evaluation completed Falls evaluation completed;Falls prevention discussed -     Objective:  Jose Waller seemed alert and oriented and he participated appropriately during our telephone visit.  Blood Pressure Weight BMI  BP Readings from Last 3 Encounters:  04/23/21 108/83  02/19/21 121/75  01/08/21 (!) 142/84   Wt Readings from Last 3 Encounters:  04/23/21 202 lb (91.6 kg)  02/19/21 202 lb (91.6 kg)  01/08/21 203 lb (92.1 kg)   BMI Readings from Last 1 Encounters:  04/23/21 32.60 kg/m    *Unable to obtain current vital signs, weight, and BMI due to telephone visit  type  Hearing/Vision  Jose Waller did not seem to have difficulty with hearing/understanding during the telephone conversation Reports that he has not had a formal eye exam by an eye care professional within the past year Reports that he has not had a formal hearing evaluation within the past year *Unable to fully assess hearing and vision during telephone visit type  Cognitive Function: 6CIT Screen 04/27/2021 08/24/2018  What Year? 4 points 0 points  What month? 0 points 0 points  What time? 0 points 0 points  Count back from 20 0 points 0 points  Months in reverse 0 points 0 points  Repeat phrase 0 points 0 points  Total Score 4 0   (Normal:0-7, Significant for Dysfunction: >8)  Normal Cognitive Function Screening: Yes   Immunization & Health Maintenance Record Immunization History  Administered Date(s) Administered   Moderna Sars-Covid-2 Vaccination 02/26/2020, 03/24/2020, 09/13/2020   Td 05/02/2008   Zoster, Live 02/12/2011    Health Maintenance  Topic Date Due   INFLUENZA VACCINE  10/26/2021 (Originally 02/26/2021)   TETANUS/TDAP  01/08/2022 (Originally 05/02/2018)   Zoster Vaccines- Shingrix (1 of 2) 04/10/2022 (Originally 12/14/1969)   COVID-19 Vaccine (4 - Booster for Moderna series) 05/09/2022 (Originally 12/06/2020)   COLONOSCOPY (Pts 45-48yrs Insurance coverage will need to be confirmed)  08/29/2024   Hepatitis C Screening  Completed   HPV VACCINES  Aged Out       Assessment  This is a routine wellness examination for Jose Waller.  Health Maintenance: Due or Overdue There are no preventive care reminders to display for this patient.  Jose Waller does not need a referral for Community Assistance: Care Management:   no Social Work:    no Prescription Assistance:  no Nutrition/Diabetes Education:  no   Plan:  Personalized Goals  Goals Addressed  This Visit's Progress     Patient Stated (pt-stated)        04/27/2021 AWV Goal: Exercise for  General Health  Patient will verbalize understanding of the benefits of increased physical activity: Exercising regularly is important. It will improve your overall fitness, flexibility, and endurance. Regular exercise also will improve your overall health. It can help you control your weight, reduce stress, and improve your bone density. Over the next year, patient will increase physical activity as tolerated with a goal of at least 150 minutes of moderate physical activity per week.  You can tell that you are exercising at a moderate intensity if your heart starts beating faster and you start breathing faster but can still hold a conversation. Moderate-intensity exercise ideas include: Walking 1 mile (1.6 km) in about 15 minutes Biking Hiking Golfing Dancing Water aerobics Patient will verbalize understanding of everyday activities that increase physical activity by providing examples like the following: Yard work, such as: Insurance underwriter Gardening Washing windows or floors Patient will be able to explain general safety guidelines for exercising:  Before you start a new exercise program, talk with your health care provider. Do not exercise so much that you hurt yourself, feel dizzy, or get very short of breath. Wear comfortable clothes and wear shoes with good support. Drink plenty of water while you exercise to prevent dehydration or heat stroke. Work out until your breathing and your heartbeat get faster.        Personalized Health Maintenance & Screening Recommendations  Pneumococcal vaccine  Influenza vaccine Td vaccine Shingrix vaccine  Patient declined the vaccines at this time.  Lung Cancer Screening Recommended: no (Low Dose CT Chest recommended if Age 24-80 years, 30 pack-year currently smoking OR have quit w/in past 15 years) Hepatitis C Screening recommended: no HIV Screening  recommended: no  Advanced Directives: Written information was not prepared per patient's request.  Referrals & Orders No orders of the defined types were placed in this encounter.   Follow-up Plan Follow-up with Agapito Games, MD as planned Schedule your appointment at the pharmacy for your tetanus and shingles shot.  If you change your mind about the flu shot and the pneumonia shot, we can give those in the office. Medicare wellness visit in one year. Patient stated that he will access AVS on mychart.   I have personally reviewed and noted the following in the patient's chart:   Medical and social history Use of alcohol, tobacco or illicit drugs  Current medications and supplements Functional ability and status Nutritional status Physical activity Advanced directives List of other physicians Hospitalizations, surgeries, and ER visits in previous 12 months Vitals Screenings to include cognitive, depression, and falls Referrals and appointments  In addition, I have reviewed and discussed with Jose Waller certain preventive protocols, quality metrics, and best practice recommendations. A written personalized care plan for preventive services as well as general preventive health recommendations is available and can be mailed to the patient at his request.      Modesto Charon, RN  04/27/2021

## 2021-04-27 NOTE — Patient Instructions (Addendum)
  MEDICARE ANNUAL WELLNESS VISIT Health Maintenance Summary and Written Plan of Care  Jose Waller ,  Thank you for allowing me to perform your Medicare Annual Wellness Visit and for your ongoing commitment to your health.   Health Maintenance & Immunization History Health Maintenance  Topic Date Due   INFLUENZA VACCINE  10/26/2021 (Originally 02/26/2021)   TETANUS/TDAP  01/08/2022 (Originally 05/02/2018)   Zoster Vaccines- Shingrix (1 of 2) 04/10/2022 (Originally 12/14/1969)   COVID-19 Vaccine (4 - Booster for Moderna series) 05/09/2022 (Originally 12/06/2020)   COLONOSCOPY (Pts 45-26yrs Insurance coverage will need to be confirmed)  08/29/2024   Hepatitis C Screening  Completed   HPV VACCINES  Aged Out   Immunization History  Administered Date(s) Administered   [employer] Vaccination 02/26/2020, 03/24/2020, 09/13/2020   Td 05/02/2008   Zoster, Live 02/12/2011    These are the patient goals that we discussed:  Goals Addressed               This Visit's Progress     Patient Stated (pt-stated)        04/27/2021 AWV Goal: Exercise for General Health  Patient will verbalize understanding of the benefits of increased physical activity: Exercising regularly is important. It will improve your overall fitness, flexibility, and endurance. Regular exercise also will improve your overall health. It can help you control your weight, reduce stress, and improve your bone density. Over the next year, patient will increase physical activity as tolerated with a goal of at least 150 minutes of moderate physical activity per week.  You can tell that you are exercising at a moderate intensity if your heart starts beating faster and you start breathing faster but can still hold a conversation. Moderate-intensity exercise ideas include: Walking 1 mile (1.6 km) in about 15 minutes Biking Hiking Golfing Dancing Water aerobics Patient will verbalize understanding of everyday activities  that increase physical activity by providing examples like the following: Yard work, such as: Insurance underwriter Gardening Washing windows or floors Patient will be able to explain general safety guidelines for exercising:  Before you start a new exercise program, talk with your health care provider. Do not exercise so much that you hurt yourself, feel dizzy, or get very short of breath. Wear comfortable clothes and wear shoes with good support. Drink plenty of water while you exercise to prevent dehydration or heat stroke. Work out until your breathing and your heartbeat get faster.          This is a list of Health Maintenance Items that are overdue or due now: Pneumococcal vaccine  Influenza vaccine Td vaccine Shingrix vaccine  Patient declined the vaccines at this time.  Orders/Referrals Placed Today: No orders of the defined types were placed in this encounter.  (Contact our referral department at [phone] if you have not spoken with someone about your referral appointment within the next 5 days)    Follow-up Plan Follow-up with Agapito Games, MD as planned Schedule your appointment at the pharmacy for your tetanus and shingles shot.  If you change your mind about the flu shot and the pneumonia shot, we can give those in the office. Medicare wellness visit in one year. Patient stated that he will access AVS on mychart.

## 2021-05-02 DIAGNOSIS — I10 Essential (primary) hypertension: Secondary | ICD-10-CM | POA: Diagnosis not present

## 2021-05-02 DIAGNOSIS — E785 Hyperlipidemia, unspecified: Secondary | ICD-10-CM | POA: Diagnosis not present

## 2021-05-02 IMAGING — DX DG KNEE COMPLETE 4+V*L*
4 series · 4 of 4 positions shown · non-contrast
Comparison: None.

CLINICAL DATA: Chronic bilateral knee pain.

EXAM:
LEFT KNEE - COMPLETE 4+ VIEW

[knee ap]
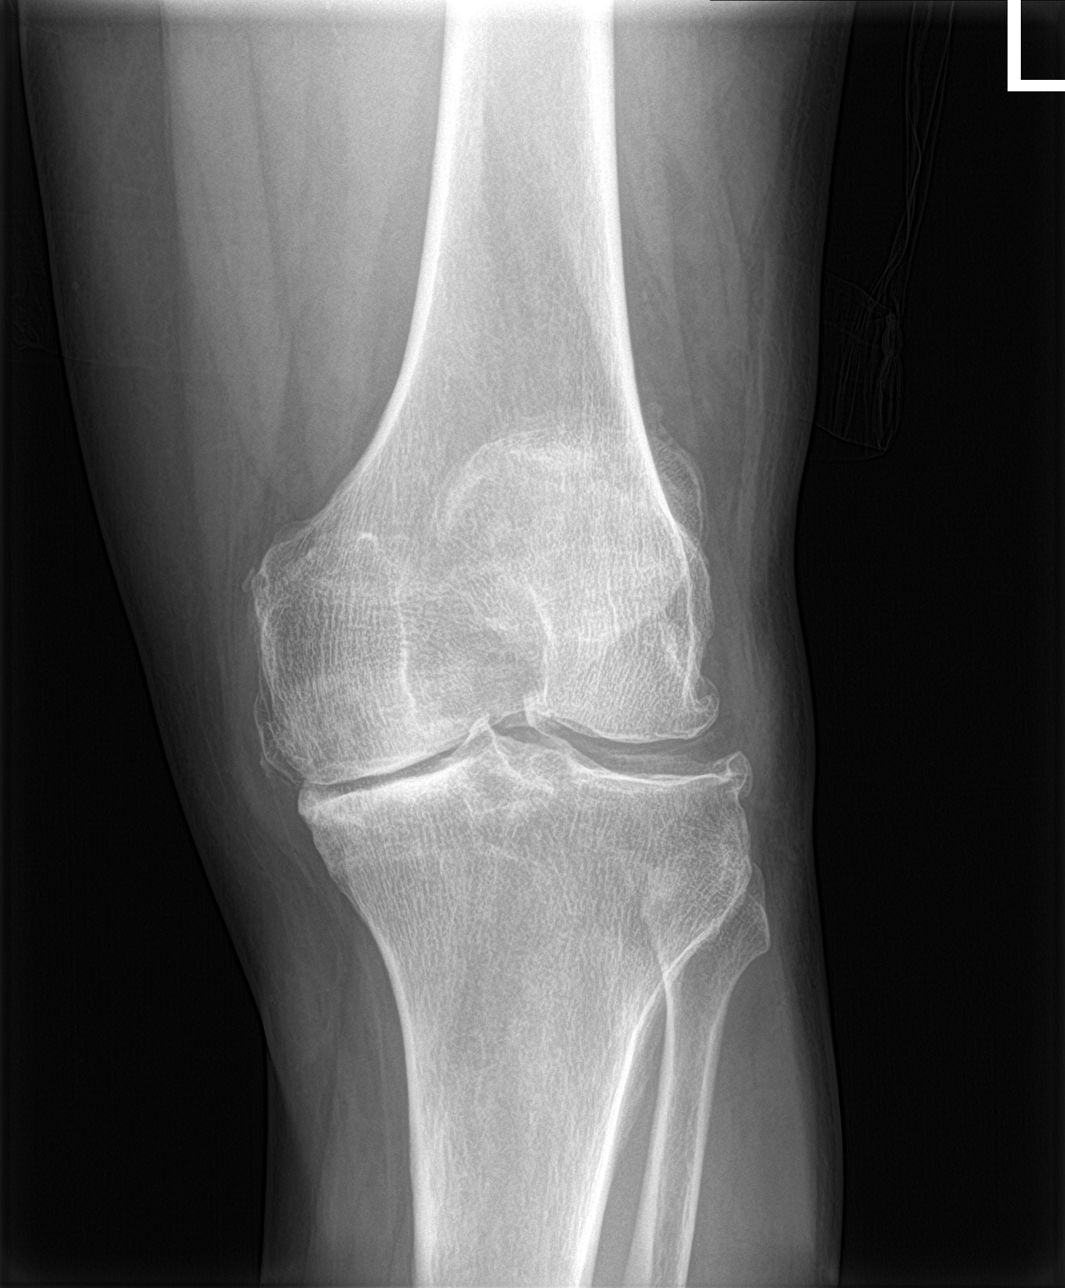

[knee lat]
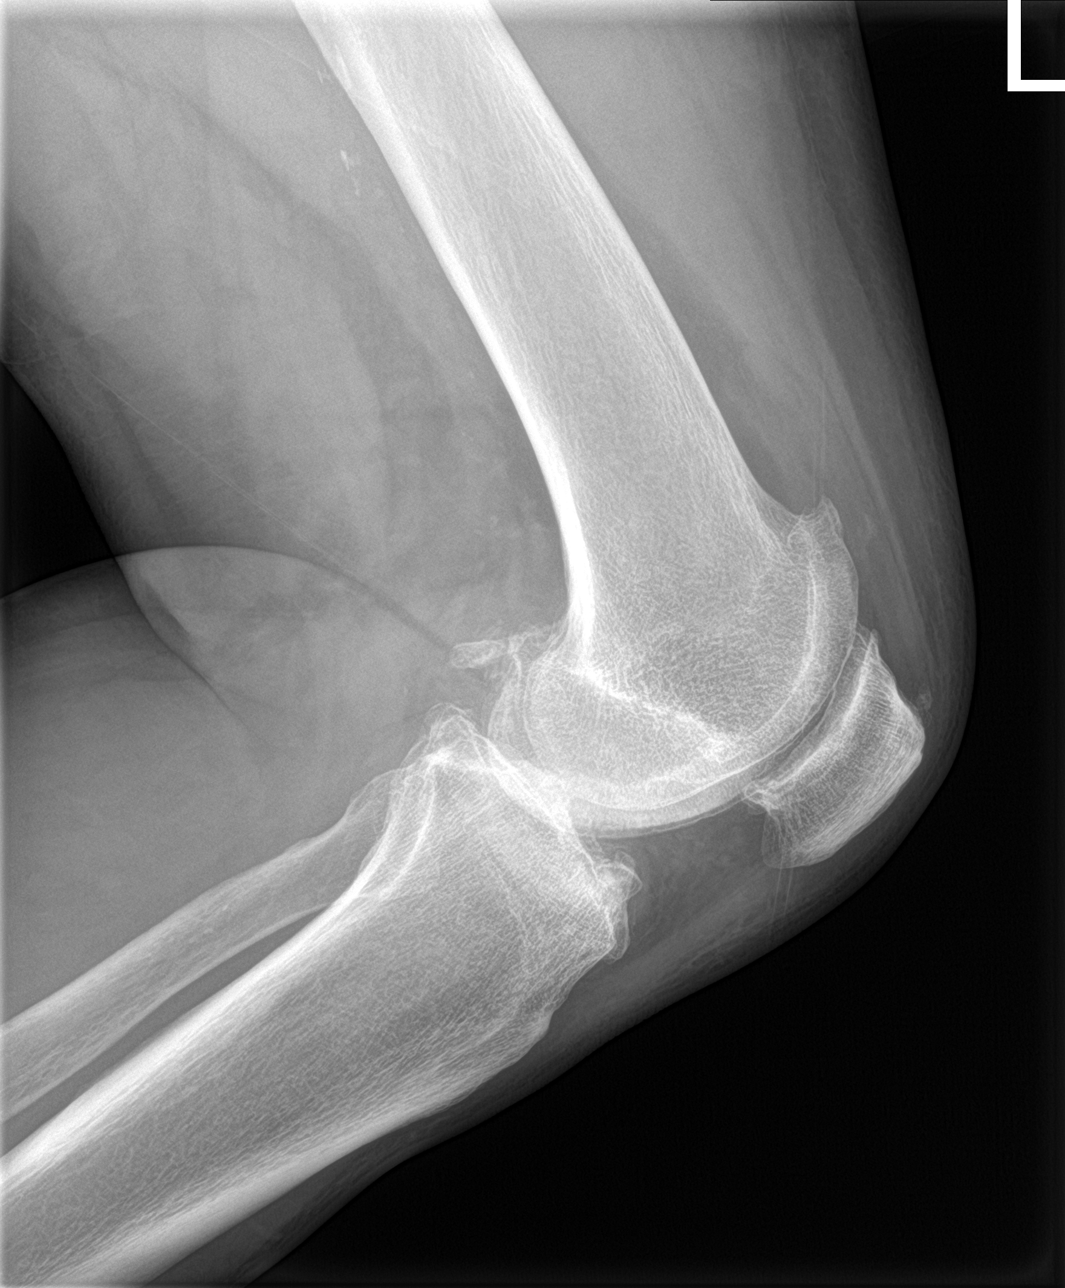

[knee obl (1 of 2)]
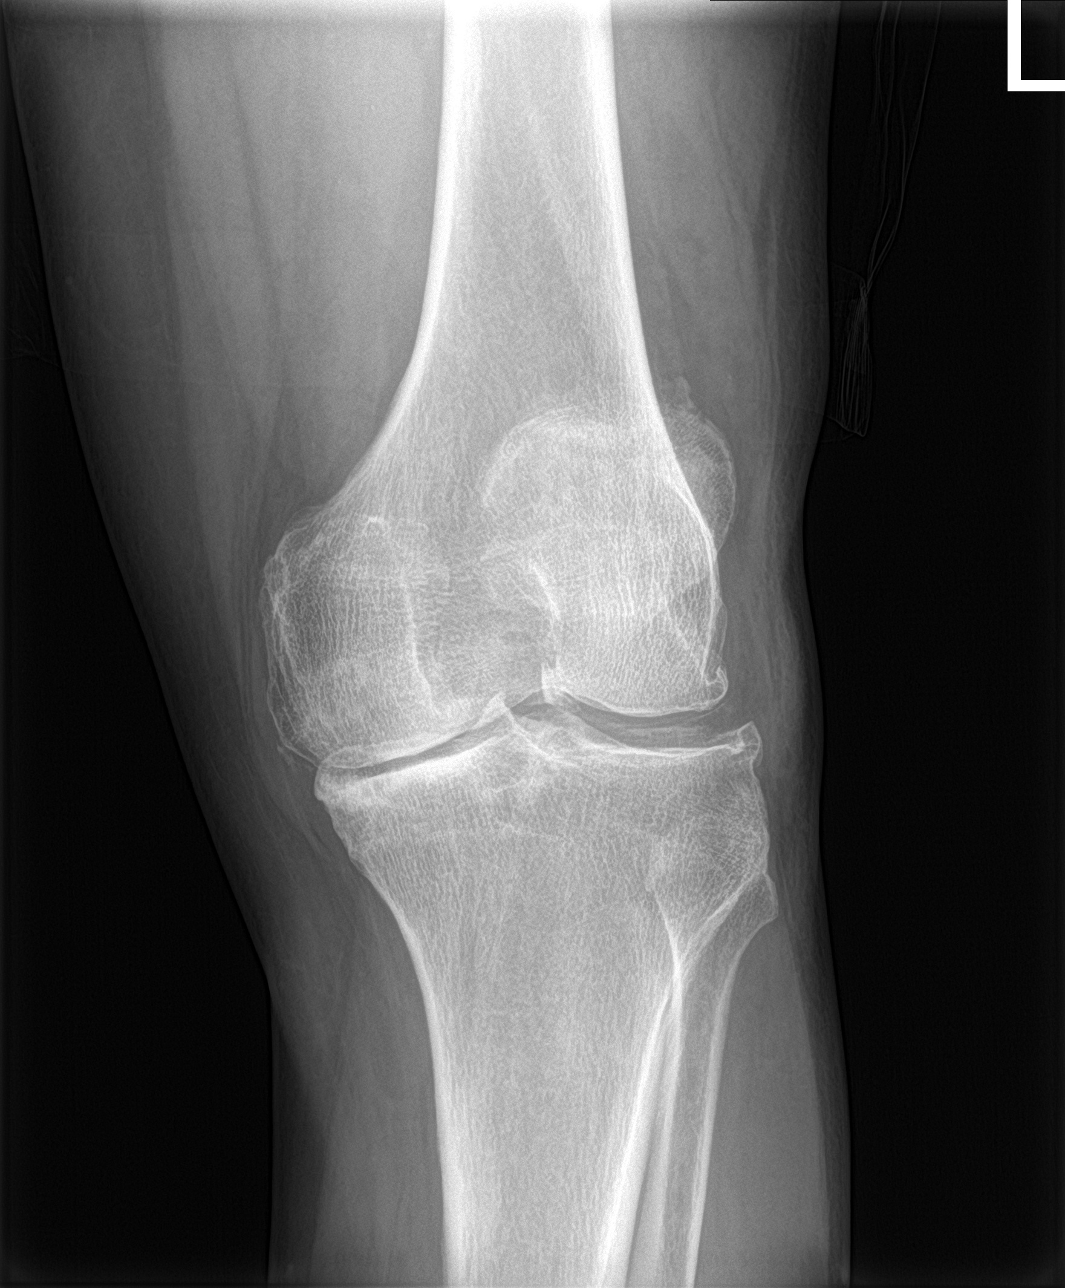

[knee obl (2 of 2)]
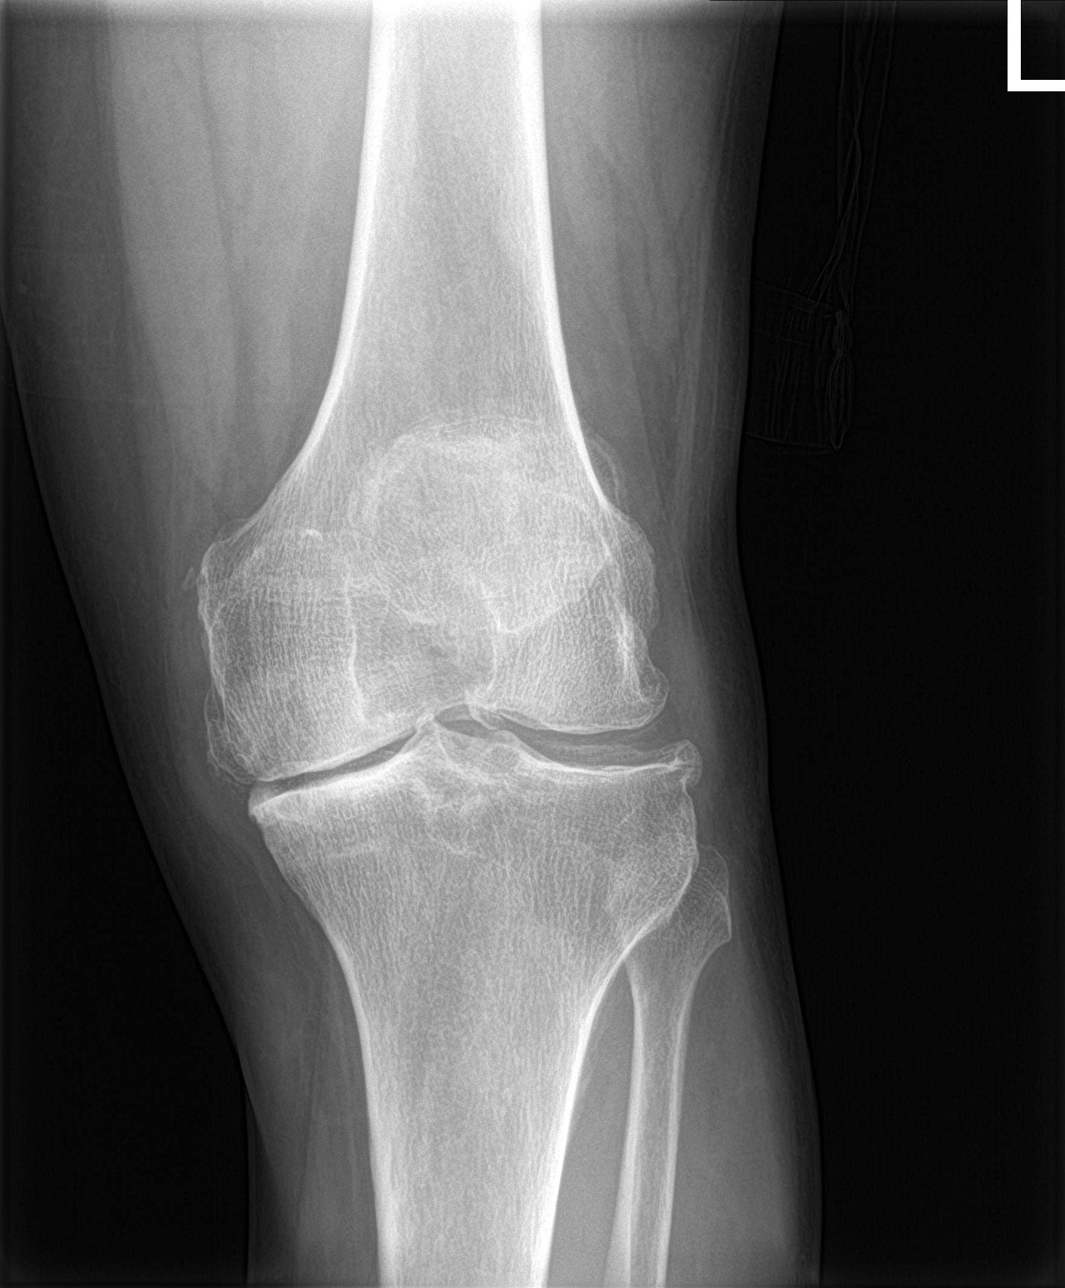

[4 of 4 positions shown; findings below may reference images not displayed]

FINDINGS: There is tricompartmental osteoarthritis, most severe in the medial
compartment. Slight varus deformity. No joint effusion.
Chondrocalcinosis.
IMPRESSION: Tricompartmental osteoarthritis, most severe in the medial
compartment.

## 2021-05-03 ENCOUNTER — Other Ambulatory Visit: Payer: Self-pay | Admitting: Family Medicine

## 2021-05-03 DIAGNOSIS — G43909 Migraine, unspecified, not intractable, without status migrainosus: Secondary | ICD-10-CM

## 2021-05-03 LAB — COMPLETE METABOLIC PANEL WITH GFR
AG Ratio: 1.5 (calc) (ref 1.0–2.5)
ALT: 12 U/L (ref 9–46)
AST: 15 U/L (ref 10–35)
Albumin: 4.3 g/dL (ref 3.6–5.1)
Alkaline phosphatase (APISO): 73 U/L (ref 35–144)
BUN/Creatinine Ratio: 14 (calc) (ref 6–22)
BUN: 21 mg/dL (ref 7–25)
CO2: 26 mmol/L (ref 20–32)
Calcium: 9.8 mg/dL (ref 8.6–10.3)
Chloride: 104 mmol/L (ref 98–110)
Creat: 1.48 mg/dL — ABNORMAL HIGH (ref 0.70–1.28)
Globulin: 2.8 g/dL (calc) (ref 1.9–3.7)
Glucose, Bld: 85 mg/dL (ref 65–99)
Potassium: 4.3 mmol/L (ref 3.5–5.3)
Sodium: 140 mmol/L (ref 135–146)
Total Bilirubin: 0.4 mg/dL (ref 0.2–1.2)
Total Protein: 7.1 g/dL (ref 6.1–8.1)
eGFR: 50 mL/min/{1.73_m2} — ABNORMAL LOW (ref >=60)

## 2021-05-03 LAB — LIPID PANEL
Cholesterol: 159 mg/dL (ref <200)
HDL: 44 mg/dL (ref >=40)
LDL Cholesterol (Calc): 96 mg/dL (calc)
Non-HDL Cholesterol (Calc): 115 mg/dL (calc) (ref <130)
Total CHOL/HDL Ratio: 3.6 (calc) (ref <5.0)
Triglycerides: 93 mg/dL (ref <150)

## 2021-05-03 NOTE — Progress Notes (Signed)
Your lab work is within acceptable range and there are no concerning findings.   ?

## 2021-05-04 NOTE — Telephone Encounter (Signed)
Medication: darifenacin (ENABLEX) 15 MG 24 hr tablet Prior authorization determination received Medication has been approved Approval dates: 04/25/2021-07/28/2021  Patient aware via: MyChart Pharmacy aware: Yes Provider aware via this encounter

## 2021-05-10 ENCOUNTER — Other Ambulatory Visit: Payer: Self-pay | Admitting: Family Medicine

## 2021-05-10 DIAGNOSIS — G43909 Migraine, unspecified, not intractable, without status migrainosus: Secondary | ICD-10-CM

## 2021-05-11 NOTE — Telephone Encounter (Signed)
This encounter was created in error - please disregard.

## 2021-06-20 ENCOUNTER — Other Ambulatory Visit: Payer: Self-pay | Admitting: Family Medicine

## 2021-09-03 ENCOUNTER — Other Ambulatory Visit: Payer: Self-pay | Admitting: Family Medicine

## 2021-09-23 ENCOUNTER — Other Ambulatory Visit: Payer: Self-pay | Admitting: Family Medicine

## 2021-10-05 ENCOUNTER — Other Ambulatory Visit: Payer: Self-pay | Admitting: Family Medicine

## 2021-10-22 ENCOUNTER — Ambulatory Visit (INDEPENDENT_AMBULATORY_CARE_PROVIDER_SITE_OTHER): Payer: Medicare Other | Admitting: Family Medicine

## 2021-10-22 ENCOUNTER — Other Ambulatory Visit: Payer: Self-pay

## 2021-10-22 ENCOUNTER — Encounter: Payer: Self-pay | Admitting: Family Medicine

## 2021-10-22 VITALS — BP 165/100 | HR 107 | Wt 207.0 lb

## 2021-10-22 DIAGNOSIS — R3915 Urgency of urination: Secondary | ICD-10-CM | POA: Diagnosis not present

## 2021-10-22 DIAGNOSIS — I10 Essential (primary) hypertension: Secondary | ICD-10-CM | POA: Diagnosis not present

## 2021-10-22 DIAGNOSIS — R7301 Impaired fasting glucose: Secondary | ICD-10-CM

## 2021-10-22 DIAGNOSIS — R32 Unspecified urinary incontinence: Secondary | ICD-10-CM

## 2021-10-22 LAB — POCT GLYCOSYLATED HEMOGLOBIN (HGB A1C): Hemoglobin A1C: 5.8 % — AB (ref 4.0–5.6)

## 2021-10-22 MED ORDER — AMLODIPINE BESYLATE 5 MG PO TABS: 5.0000 mg | ORAL_TABLET | Freq: Every day | ORAL | 1 refills | Status: DC

## 2021-10-22 NOTE — Assessment & Plan Note (Signed)
A1c actually looks great today at 5.8 which was actually down from previous of 6.1.  So I do think he is doing a great job with his diet. ?Lab Results  ?Component Value Date  ? HGBA1C 5.8 (A) 10/22/2021  ? ? ?

## 2021-10-22 NOTE — Assessment & Plan Note (Addendum)
Pressure is quite elevated today in fact last time I saw him in September it was low so we had backed off on his medication and discontinued his HCTZ 12.5 mg. Will start amlodipine 5mg .  Repeat BP in about 3-4 weeks.  ?

## 2021-10-22 NOTE — Patient Instructions (Signed)
Come back in 2 weeks for bp check with nurse 

## 2021-10-22 NOTE — Progress Notes (Signed)
? ?Established Patient Office Visit ? ?Subjective:  ?Patient ID: Jose Waller, male    DOB: 01/18/1951  Age: 71 y.o. MRN: [id number] ? ?CC:  ?Chief Complaint  ?Patient presents with  ? Hypertension  ? ? ?HPI ?[name] presents for  ? ?Hypertension- Pt denies chest pain, SOB, dizziness, or heart palpitations.  Taking meds as directed w/o problems.  Denies medication side effects.   ? ?Says he still occ has problems when he walks.  Says he gets tired easily.  Denies any shortness of breath or chest pain or lower extremity swelling.  He has been having some arthritis issues with his knee but says its actually better and he does not feel like that is what is actually keeping him from having difficulty walking some days. ? ?Impaired fasting glucose-no increased thirst or urination. No symptoms consistent with hypoglycemia. ? ? ?Past Medical History:  ?Diagnosis Date  ? Allergy   ? Arthritis   ? Cataract   ? MD just watching  ? CKD (chronic kidney disease) stage 3, GFR 30-59 ml/min (HCC) 06/05/2018  ? Cluster headache   ? resolved  ? Glaucoma   ? Hypertension   ? Substance abuse ([place])   ? cocaine - quit 1998  ? ? ?Past Surgical History:  ?Procedure Laterality Date  ? back lumbar  2004  ? fusion   ? COLONOSCOPY  06/09/2009  ? Dr Juleen Starr - normal  ? WISDOM TOOTH EXTRACTION    ? ? ?Family History  ?Problem Relation Age of Onset  ? Lung cancer Father   ?     lung/ was a Glass blower/designer  ? Alcohol abuse Other   ? Hypertension Brother   ? Dementia Mother   ? Coronary artery disease Brother   ? Colon cancer Neg Hx   ? Stomach cancer Neg Hx   ? Rectal cancer Neg Hx   ? ? ?Social History  ? ?Socioeconomic History  ? Marital status: Widowed  ?  Spouse name: Not on file  ? Number of children: 5  ? Years of education: 62  ? Highest education level: 12th grade  ?Occupational History  ? Occupation: retired  ?  Comment: pest control technician  ?Tobacco Use  ? Smoking status: Former  ?  Types: Cigarettes  ?  Quit date: 07/29/2001  ?  Years  since quitting: 20.2  ? Smokeless tobacco: Never  ?Vaping Use  ? Vaping Use: Never used  ?Substance and Sexual Activity  ? Alcohol use: No  ? Drug use: Not Currently  ?  Comment: Former cocaine user, quit 1998  ? Sexual activity: Yes  ?Other Topics Concern  ? Not on file  ?Social History Narrative  ? On disability. Wife passed away in a motor vehicle accident. He has five children. He enjoys going to the flea market.  ? ?Social Determinants of Health  ? ?Financial Resource Strain: Low Risk   ? Difficulty of Paying Living Expenses: Not hard at all  ?Food Insecurity: No Food Insecurity  ? Worried About Charity fundraiser in the Last Year: Never true  ? Ran Out of Food in the Last Year: Never true  ?Transportation Needs: No Transportation Needs  ? Lack of Transportation (Medical): No  ? Lack of Transportation (Non-Medical): No  ?Physical Activity: Inactive  ? Days of Exercise per Week: 0 days  ? Minutes of Exercise per Session: 0 min  ?Stress: No Stress Concern Present  ? Feeling of Stress : Not at  all  ?Social Connections: Moderately Integrated  ? Frequency of Communication with Friends and Family: More than three times a week  ? Frequency of Social Gatherings with Friends and Family: Once a week  ? Attends Religious Services: More than 4 times per year  ? Active Member of Clubs or Organizations: Yes  ? Attends Archivist Meetings: More than 4 times per year  ? Marital Status: Widowed  ?Intimate Partner Violence: Not At Risk  ? Fear of Current or Ex-Partner: No  ? Emotionally Abused: No  ? Physically Abused: No  ? Sexually Abused: No  ? ? ?Outpatient Medications Prior to Visit  ?Medication Sig Dispense Refill  ? atorvastatin (LIPITOR) 20 MG tablet TAKE 1 TABLET BY MOUTH AT BEDTIME 90 tablet 1  ? diclofenac Sodium (VOLTAREN) 1 % GEL Apply 2 g topically 4 (four) times daily. To affected joint. 100 g 11  ? meloxicam (MOBIC) 15 MG tablet Take 1 tablet by mouth once daily 30 tablet 0  ? omeprazole (PRILOSEC) 40  MG capsule Take 1 capsule by mouth once daily 90 capsule 3  ? valsartan (DIOVAN) 320 MG tablet Take 1 tablet (320 mg total) by mouth daily. 90 tablet 1  ? vardenafil (LEVITRA) 10 MG tablet Take 1 tablet (10 mg total) by mouth daily as needed for erectile dysfunction. 10 tablet 5  ? darifenacin (ENABLEX) 15 MG 24 hr tablet Take 1 tablet (15 mg total) by mouth daily. (Patient not taking: Reported on 10/22/2021) 30 tablet 2  ? ?No facility-administered medications prior to visit.  ? ? ?Allergies  ?Allergen Reactions  ? Prednisone   ?  REACTION: hick ups  ? ? ?ROS ?Review of Systems ? ?  ?Objective:  ?  ?Physical Exam ?Constitutional:   ?   Appearance: Normal appearance. He is well-developed.  ?HENT:  ?   Head: Normocephalic and atraumatic.  ?Cardiovascular:  ?   Rate and Rhythm: Normal rate and regular rhythm.  ?   Heart sounds: Normal heart sounds.  ?Pulmonary:  ?   Effort: Pulmonary effort is normal.  ?   Breath sounds: Normal breath sounds.  ?Skin: ?   General: Skin is warm and dry.  ?Neurological:  ?   Mental Status: He is alert and oriented to person, place, and time. Mental status is at baseline.  ?Psychiatric:     ?   Behavior: Behavior normal.  ? ? ?BP (!) 165/100   Pulse (!) 107   Wt 207 lb (93.9 kg)   SpO2 97%   BMI 33.41 kg/m?  ?Wt Readings from Last 3 Encounters:  ?10/22/21 207 lb (93.9 kg)  ?04/23/21 202 lb (91.6 kg)  ?02/19/21 202 lb (91.6 kg)  ? ? ? ?There are no preventive care reminders to display for this patient. ? ? ?There are no preventive care reminders to display for this patient. ? ?Lab Results  ?Component Value Date  ? TSH 1.03 12/08/2020  ? ?Lab Results  ?Component Value Date  ? WBC 8.2 12/08/2020  ? HGB 16.8 12/08/2020  ? HCT 50.7 (H) 12/08/2020  ? MCV 85.9 12/08/2020  ? PLT 342 12/08/2020  ? ?Lab Results  ?Component Value Date  ? NA 140 05/02/2021  ? K 4.3 05/02/2021  ? CO2 26 05/02/2021  ? GLUCOSE 85 05/02/2021  ? BUN 21 05/02/2021  ? CREATININE 1.48 (H) 05/02/2021  ? BILITOT 0.4  05/02/2021  ? ALKPHOS 71 05/15/2016  ? AST 15 05/02/2021  ? ALT 12 05/02/2021  ? PROT  7.1 05/02/2021  ? ALBUMIN 4.1 05/15/2016  ? CALCIUM 9.8 05/02/2021  ? EGFR 50 (L) 05/02/2021  ? ?Lab Results  ?Component Value Date  ? CHOL 159 05/02/2021  ? ?Lab Results  ?Component Value Date  ? HDL 44 05/02/2021  ? ?Lab Results  ?Component Value Date  ? [place] 96 05/02/2021  ? ?Lab Results  ?Component Value Date  ? TRIG 93 05/02/2021  ? ?Lab Results  ?Component Value Date  ? CHOLHDL 3.6 05/02/2021  ? ?Lab Results  ?Component Value Date  ? HGBA1C 5.8 (A) 10/22/2021  ? ? ?  ?Assessment & Plan:  ? ?Problem List Items Addressed This Visit   ? ?  ? Cardiovascular and Mediastinum  ? HYPERTENSION  ?  Pressure is quite elevated today in fact last time I saw him in September it was low so we had backed off on his medication and discontinued his HCTZ 12.5 mg. Will start amlodipine 32m.  Repeat BP in about 3-4 weeks.  ?  ?  ? Relevant Medications  ? amLODipine (NORVASC) 5 MG tablet  ? Other Relevant Orders  ? BASIC METABOLIC PANEL WITH GFR  ?  ? Endocrine  ? IFG (impaired fasting glucose) - Primary  ?  A1c actually looks great today at 5.8 which was actually down from previous of 6.1.  So I do think he is doing a great job with his diet. ?Lab Results  ?Component Value Date  ? HGBA1C 5.8 (A) 10/22/2021  ? ? ?  ?  ? Relevant Orders  ? POCT glycosylated hemoglobin (Hb A1C) (Completed)  ?  ? Other  ? Urinary urgency  ?  Still continuing to get urinary urgency, and leakage said like to go ahead and refer him to urology at this point.   Has tried oxybutynin, Enablex, Vesicare, and Myrbetriq all are ineffective. ?  ?  ? Relevant Orders  ? Ambulatory referral to Urology  ? ?Other Visit Diagnoses   ? ? Urinary incontinence, unspecified type      ? Relevant Orders  ? Ambulatory referral to Urology  ? ?  ? ?Incontinence with some leakage we will go ahead and refer to urology. ? ? ?Meds ordered this encounter  ?Medications  ? amLODipine (NORVASC) 5  MG tablet  ?  Sig: Take 1 tablet (5 mg total) by mouth daily.  ?  Dispense:  30 tablet  ?  Refill:  1  ? ? ?Follow-up: Return in about 4 months (around 02/21/2022) for BP.  ? ? ?CBeatrice Lecher MD ?

## 2021-10-22 NOTE — Assessment & Plan Note (Addendum)
Still continuing to get urinary urgency, and leakage said like to go ahead and refer him to urology at this point.   Has tried oxybutynin, Enablex, Vesicare, and Myrbetriq all are ineffective. ?

## 2021-10-23 LAB — BASIC METABOLIC PANEL WITH GFR
BUN/Creatinine Ratio: 12 (calc) (ref 6–22)
BUN: 17 mg/dL (ref 7–25)
CO2: 24 mmol/L (ref 20–32)
Calcium: 9.9 mg/dL (ref 8.6–10.3)
Chloride: 104 mmol/L (ref 98–110)
Creat: 1.44 mg/dL — ABNORMAL HIGH (ref 0.70–1.28)
Glucose, Bld: 86 mg/dL (ref 65–99)
Potassium: 4.6 mmol/L (ref 3.5–5.3)
Sodium: 139 mmol/L (ref 135–146)
eGFR: 52 mL/min/{1.73_m2} — ABNORMAL LOW (ref >=60)

## 2021-10-23 NOTE — Progress Notes (Signed)
Kidney function is elevated but stable over the last couple of years.

## 2021-10-25 ENCOUNTER — Telehealth: Payer: Self-pay | Admitting: Family Medicine

## 2021-10-25 DIAGNOSIS — N529 Male erectile dysfunction, unspecified: Secondary | ICD-10-CM

## 2021-10-25 MED ORDER — SILDENAFIL CITRATE 20 MG PO TABS: 20.0000 mg | ORAL_TABLET | ORAL | 11 refills | Status: DC | PRN

## 2021-10-25 NOTE — Telephone Encounter (Signed)
Patient made aware this was sent to the pharmacy.  ?

## 2021-10-25 NOTE — Telephone Encounter (Signed)
Sildenafil 20 mg refilled, vardenafil taken off the chart. ?

## 2021-10-25 NOTE — Telephone Encounter (Signed)
I don't see medication on current list. Looks like given RX for Levitra/Vardenafil 10 mg PRN ED on 04/23/2021 #10 with 5 refills. Sildenafil discontinued on 04/23/2021 by Dr. Linford Arnold due to headache. Please advise.  ?

## 2021-10-25 NOTE — Telephone Encounter (Signed)
sildenafil (REVATIO) 20 MG tablet [id number]  sildenafil (REVATIO) 20 MG tablet [id number]   ?Patient came in to office and requested a refill on above medication - Patient stated he has a hot date coming up. -lmr ?

## 2021-10-26 ENCOUNTER — Telehealth: Payer: Self-pay

## 2021-10-26 NOTE — Telephone Encounter (Addendum)
Initiated Prior authorization [id number] Citrate 20MG  tablets ?Via: Covermymeds ?Case/Key:[id number] ?Status: denied  as of 10/26/21 ?Reason:SILDENAFIL TAB 20MG  (use as directed: 5 tablets per day) is denied for not meeting the ?quantity limit requirement. Your plan limit is 3 tablets per day. Coverage for the requested ?quantity requires the following: ?One of the following: ?(1) The higher dose or quantity is supported in the dosage and administration section of the ?manufacturer's prescribing information. ? ?Notified Pt via: Mychart ?

## 2021-11-06 ENCOUNTER — Other Ambulatory Visit: Payer: Self-pay | Admitting: Family Medicine

## 2021-11-29 ENCOUNTER — Other Ambulatory Visit: Payer: Self-pay | Admitting: Family Medicine

## 2021-12-11 ENCOUNTER — Other Ambulatory Visit: Payer: Self-pay | Admitting: Family Medicine

## 2021-12-31 ENCOUNTER — Other Ambulatory Visit: Payer: Self-pay | Admitting: Family Medicine

## 2022-01-12 ENCOUNTER — Other Ambulatory Visit: Payer: Self-pay | Admitting: Family Medicine

## 2022-01-28 ENCOUNTER — Other Ambulatory Visit: Payer: Self-pay | Admitting: Family Medicine

## 2022-01-28 DIAGNOSIS — M1712 Unilateral primary osteoarthritis, left knee: Secondary | ICD-10-CM

## 2022-02-06 ENCOUNTER — Other Ambulatory Visit: Payer: Self-pay | Admitting: Family Medicine

## 2022-02-06 DIAGNOSIS — M1712 Unilateral primary osteoarthritis, left knee: Secondary | ICD-10-CM

## 2022-02-21 ENCOUNTER — Ambulatory Visit (INDEPENDENT_AMBULATORY_CARE_PROVIDER_SITE_OTHER): Payer: Medicare Other | Admitting: Family Medicine

## 2022-02-21 ENCOUNTER — Encounter: Payer: Self-pay | Admitting: Family Medicine

## 2022-02-21 VITALS — BP 138/84 | HR 88 | Ht 66.0 in | Wt 210.0 lb

## 2022-02-21 DIAGNOSIS — M25562 Pain in left knee: Secondary | ICD-10-CM | POA: Diagnosis not present

## 2022-02-21 DIAGNOSIS — R3915 Urgency of urination: Secondary | ICD-10-CM

## 2022-02-21 DIAGNOSIS — I10 Essential (primary) hypertension: Secondary | ICD-10-CM

## 2022-02-21 DIAGNOSIS — E785 Hyperlipidemia, unspecified: Secondary | ICD-10-CM | POA: Diagnosis not present

## 2022-02-21 DIAGNOSIS — G8929 Other chronic pain: Secondary | ICD-10-CM

## 2022-02-21 HISTORY — DX: Other chronic pain: G89.29

## 2022-02-21 MED ORDER — ATORVASTATIN CALCIUM 20 MG PO TABS: 20.0000 mg | ORAL_TABLET | Freq: Every day | ORAL | 3 refills | Status: DC

## 2022-02-21 MED ORDER — VALSARTAN 320 MG PO TABS: 320.0000 mg | ORAL_TABLET | Freq: Every day | ORAL | 3 refills | Status: DC

## 2022-02-21 MED ORDER — CELECOXIB 200 MG PO CAPS: 200.0000 mg | ORAL_CAPSULE | Freq: Two times a day (BID) | ORAL | 5 refills | Status: DC

## 2022-02-21 MED ORDER — AMLODIPINE BESYLATE 5 MG PO TABS: 5.0000 mg | ORAL_TABLET | Freq: Every day | ORAL | 1 refills | Status: DC

## 2022-02-21 NOTE — Progress Notes (Signed)
Established Patient Office Visit  Subjective   Patient ID: Jose Waller, male    DOB: 1950/12/22  Age: 71 y.o. MRN: [id number]  Chief Complaint  Patient presents with   Hypertension   Knee Pain        Urinary Urgency    Pt never heard from [facility] for an appt. He states that his sleep is disturbed from getting up so much.     Fatigue    Pt stated that he has no energy. He said that he is active but doesn't have any energy.    HPI  Hypertension- Pt denies chest pain, SOB, dizziness, or heart palpitations.  Taking meds as directed w/o problems.  Denies medication side effects.  Amlodipine added at last OV. Previously on HCTZ and did well but d/c bc of gout.   He has been having problems with his left knee.  He had an injection done at [facility] on May 19 by Dr. Charyl Bigger.  He said it did help for about a month but is too early for him to return for another injection and he says he is not sure he is interested in another injection.  He has mostly been relying on the Voltaren gel which does help some and he has been wearing a copper sleeve which also helps.  He says the meloxicam helped initially but now he feels like it does not work so he is just discontinued it.   F/U urinary urgency.  We had placed a referral at last office visit but I have not received follow-up office visit note.     ROS    Objective:     BP 138/84   Pulse 88   Ht 5\' 6"  (1.676 m)   Wt 210 lb (95.3 kg)   SpO2 99%   BMI 33.89 kg/m    Physical Exam Constitutional:      Appearance: He is well-developed.  HENT:     Head: Normocephalic and atraumatic.  Cardiovascular:     Rate and Rhythm: Normal rate and regular rhythm.     Heart sounds: Normal heart sounds.  Pulmonary:     Effort: Pulmonary effort is normal.     Breath sounds: Normal breath sounds.  Skin:    General: Skin is warm and dry.  Neurological:     Mental Status: He is alert and oriented to person,  place, and time.  Psychiatric:        Behavior: Behavior normal.      No results found for any visits on 02/21/22.    The 10-year ASCVD risk score (Arnett DK, et al., 2019) is: 23%    Assessment & Plan:   Problem List Items Addressed This Visit       Cardiovascular and Mediastinum   HYPERTENSION    Well controlled. Continue current regimen. Follow up in  6 mo       Relevant Medications   atorvastatin (LIPITOR) 20 MG tablet   valsartan (DIOVAN) 320 MG tablet   amLODipine (NORVASC) 5 MG tablet     Other   Urinary urgency - Primary    Still continuing to get urinary urgency, and leakage said like to go ahead and refer him to urology at this point.   Has tried oxybutynin, Enablex, Vesicare, and Myrbetriq all are ineffective. Off diuretic.   We call as he had not heard back on ref. They will see him tomorrow.       Hyperlipidemia  Relevant Medications   atorvastatin (LIPITOR) 20 MG tablet   valsartan (DIOVAN) 320 MG tablet   amLODipine (NORVASC) 5 MG tablet   Chronic pain of left knee    Continue with Voltaren gel that helps some as well as knee sleeve.  We discussed maybe doing Celebrex since he feels like meloxicam no longer is helpful he feels like it worked initially but then just lost its efficacy.  If not improving he can always follow back up with the orthopedist that did his injection.      Relevant Medications   celecoxib (CELEBREX) 200 MG capsule     Return in about 2 months (around 04/29/2022) for Hypertension and A1C .    Nani Gasser, MD

## 2022-02-21 NOTE — Assessment & Plan Note (Signed)
Well controlled. Continue current regimen. Follow up in  6 mo  

## 2022-02-21 NOTE — Assessment & Plan Note (Signed)
Continue with Voltaren gel that helps some as well as knee sleeve.  We discussed maybe doing Celebrex since he feels like meloxicam no longer is helpful he feels like it worked initially but then just lost its efficacy.  If not improving he can always follow back up with the orthopedist that did his injection.

## 2022-02-21 NOTE — Assessment & Plan Note (Signed)
Still continuing to get urinary urgency, and leakage said like to go ahead and refer him to urology at this point.   Has tried oxybutynin, Enablex, Vesicare, and Myrbetriq all are ineffective. Off diuretic.   We call as he had not heard back on ref. They will see him tomorrow.

## 2022-02-22 DIAGNOSIS — R350 Frequency of micturition: Secondary | ICD-10-CM | POA: Diagnosis not present

## 2022-03-03 ENCOUNTER — Other Ambulatory Visit: Payer: Self-pay | Admitting: Family Medicine

## 2022-03-17 ENCOUNTER — Other Ambulatory Visit: Payer: Self-pay | Admitting: Family Medicine

## 2022-03-31 ENCOUNTER — Other Ambulatory Visit: Payer: Self-pay | Admitting: Family Medicine

## 2022-03-31 DIAGNOSIS — M1712 Unilateral primary osteoarthritis, left knee: Secondary | ICD-10-CM

## 2022-04-03 DIAGNOSIS — R350 Frequency of micturition: Secondary | ICD-10-CM | POA: Diagnosis not present

## 2022-04-03 DIAGNOSIS — R3915 Urgency of urination: Secondary | ICD-10-CM | POA: Diagnosis not present

## 2022-04-25 ENCOUNTER — Encounter: Payer: Self-pay | Admitting: Family Medicine

## 2022-04-25 ENCOUNTER — Ambulatory Visit (INDEPENDENT_AMBULATORY_CARE_PROVIDER_SITE_OTHER): Payer: Medicare Other | Admitting: Family Medicine

## 2022-04-25 VITALS — BP 132/88 | HR 101 | Ht 66.0 in | Wt 208.0 lb

## 2022-04-25 DIAGNOSIS — N1831 Chronic kidney disease, stage 3a: Secondary | ICD-10-CM

## 2022-04-25 DIAGNOSIS — E785 Hyperlipidemia, unspecified: Secondary | ICD-10-CM | POA: Diagnosis not present

## 2022-04-25 DIAGNOSIS — M25562 Pain in left knee: Secondary | ICD-10-CM | POA: Diagnosis not present

## 2022-04-25 DIAGNOSIS — R7301 Impaired fasting glucose: Secondary | ICD-10-CM

## 2022-04-25 DIAGNOSIS — I10 Essential (primary) hypertension: Secondary | ICD-10-CM | POA: Diagnosis not present

## 2022-04-25 DIAGNOSIS — Z125 Encounter for screening for malignant neoplasm of prostate: Secondary | ICD-10-CM

## 2022-04-25 DIAGNOSIS — N3281 Overactive bladder: Secondary | ICD-10-CM | POA: Diagnosis not present

## 2022-04-25 DIAGNOSIS — G8929 Other chronic pain: Secondary | ICD-10-CM

## 2022-04-25 DIAGNOSIS — M25561 Pain in right knee: Secondary | ICD-10-CM

## 2022-04-25 LAB — POCT GLYCOSYLATED HEMOGLOBIN (HGB A1C): Hemoglobin A1C: 5.6 % (ref 4.0–5.6)

## 2022-04-25 MED ORDER — AMLODIPINE BESYLATE 10 MG PO TABS: 10.0000 mg | ORAL_TABLET | Freq: Every day | ORAL | 3 refills | Status: DC

## 2022-04-25 NOTE — Progress Notes (Signed)
Established Patient Office Visit  Subjective   Patient ID: MIR FULLILOVE, male    DOB: 08-01-1950  Age: 71 y.o. MRN: [id number]  Chief Complaint  Patient presents with   Hypertension   IFG    HPI   Hypertension- Pt denies chest pain, SOB, dizziness, or heart palpitations.  Taking meds as directed w/o problems.  Denies medication side effects.    Impaired fasting glucose-no increased thirst or urination. No symptoms consistent with hypoglycemia.  He is now living in a senior center and doing well.  He is still driving.      ROS    Objective:     BP 132/88   Pulse (!) 101   Ht 5\' 6"  (1.676 m)   Wt 208 lb (94.3 kg)   SpO2 99%   BMI 33.57 kg/m    Physical Exam Constitutional:      Appearance: He is well-developed.  HENT:     Head: Normocephalic and atraumatic.  Cardiovascular:     Rate and Rhythm: Normal rate and regular rhythm.     Heart sounds: Normal heart sounds.  Pulmonary:     Effort: Pulmonary effort is normal.     Breath sounds: Normal breath sounds.  Skin:    General: Skin is warm and dry.  Neurological:     Mental Status: He is alert and oriented to person, place, and time.  Psychiatric:        Behavior: Behavior normal.     Results for orders placed or performed in visit on 04/25/22  POCT glycosylated hemoglobin (Hb A1C)  Result Value Ref Range   Hemoglobin A1C 5.6 4.0 - 5.6 %   HbA1c POC (<> result, manual entry)     HbA1c, POC (prediabetic range)     HbA1c, POC (controlled diabetic range)        The 10-year ASCVD risk score (Arnett DK, et al., 2019) is: 21.4%    Assessment & Plan:   Problem List Items Addressed This Visit       Cardiovascular and Mediastinum   HYPERTENSION - Primary    Blood pressure not at goal today.  He says he has been getting similar numbers at home.  We will go ahead and increase his amlodipine to 10 mg.  Follow-up in 1 month for nurse visit and follow-up with me in 6 months.  We will get updated labs  today.      Relevant Medications   amLODipine (NORVASC) 10 MG tablet   Other Relevant Orders   POCT glycosylated hemoglobin (Hb A1C) (Completed)   COMPLETE METABOLIC PANEL WITH GFR   Lipid panel     Endocrine   IFG (impaired fasting glucose)    A1c looks phenomenal today at 5.6.  He is really done a great job in changing his diet and losing a couple pounds since I last saw him.  Just encouraged him to keep up the great work.  We will plan to recheck again in 6 months.      Relevant Orders   POCT glycosylated hemoglobin (Hb A1C) (Completed)   COMPLETE METABOLIC PANEL WITH GFR   Lipid panel     Genitourinary   OAB (overactive bladder)    Says the [place] [facility] has really been helping.        CKD stage G3a/A1, GFR 45-59 and albumin creatinine ratio <30 mg/g (HCC)    Due to recheck renal function.        Other   Hyperlipidemia  Due to recheck lipids.       Relevant Medications   amLODipine (NORVASC) 10 MG tablet   Other Relevant Orders   POCT glycosylated hemoglobin (Hb A1C) (Completed)   COMPLETE METABOLIC PANEL WITH GFR   Lipid panel   Other Visit Diagnoses     Chronic pain of both knees       Screening for prostate cancer       Relevant Orders   PSA       Return in about 1 month (around 05/25/2022) for nurse visit for his BP amd f/U with me in 6 months. Beatrice Lecher, MD

## 2022-04-25 NOTE — Assessment & Plan Note (Signed)
Blood pressure not at goal today.  He says he has been getting similar numbers at home.  We will go ahead and increase his amlodipine to 10 mg.  Follow-up in 1 month for nurse visit and follow-up with me in 6 months.  We will get updated labs today.

## 2022-04-25 NOTE — Assessment & Plan Note (Signed)
Due to recheck renal function. 

## 2022-04-25 NOTE — Assessment & Plan Note (Signed)
Says the Jose Waller has really been helping.

## 2022-04-25 NOTE — Patient Instructions (Signed)
Please get an appoint with Dr. Dianah Field here in our office for your knee pain.

## 2022-04-25 NOTE — Assessment & Plan Note (Signed)
Due to recheck lipids. 

## 2022-04-25 NOTE — Assessment & Plan Note (Signed)
A1c looks phenomenal today at 5.6.  He is really done a great job in changing his diet and losing a couple pounds since I last saw him.  Just encouraged him to keep up the great work.  We will plan to recheck again in 6 months.

## 2022-04-26 LAB — COMPLETE METABOLIC PANEL WITH GFR
AG Ratio: 1.4 (calc) (ref 1.0–2.5)
ALT: 13 U/L (ref 9–46)
AST: 16 U/L (ref 10–35)
Albumin: 4.6 g/dL (ref 3.6–5.1)
Alkaline phosphatase (APISO): 78 U/L (ref 35–144)
BUN: 17 mg/dL (ref 7–25)
CO2: 25 mmol/L (ref 20–32)
Calcium: 10.2 mg/dL (ref 8.6–10.3)
Chloride: 104 mmol/L (ref 98–110)
Creat: 1.28 mg/dL (ref 0.70–1.28)
Globulin: 3.2 g/dL (calc) (ref 1.9–3.7)
Glucose, Bld: 93 mg/dL (ref 65–99)
Potassium: 4.4 mmol/L (ref 3.5–5.3)
Sodium: 139 mmol/L (ref 135–146)
Total Bilirubin: 0.5 mg/dL (ref 0.2–1.2)
Total Protein: 7.8 g/dL (ref 6.1–8.1)
eGFR: 59 mL/min/{1.73_m2} — ABNORMAL LOW (ref >=60)

## 2022-04-26 LAB — LIPID PANEL
Cholesterol: 165 mg/dL (ref <200)
HDL: 45 mg/dL (ref >=40)
LDL Cholesterol (Calc): 97 mg/dL (calc)
Non-HDL Cholesterol (Calc): 120 mg/dL (calc) (ref <130)
Total CHOL/HDL Ratio: 3.7 (calc) (ref <5.0)
Triglycerides: 131 mg/dL (ref <150)

## 2022-04-26 LAB — PSA: PSA: 2.06 ng/mL (ref <=4.00)

## 2022-04-26 NOTE — Progress Notes (Signed)
Hi Jose Waller.  Your metabolic panel is Ok. Cholesterol also looks good. Prostate test is normal.

## 2022-04-29 ENCOUNTER — Ambulatory Visit (INDEPENDENT_AMBULATORY_CARE_PROVIDER_SITE_OTHER): Payer: Medicare Other | Admitting: Sports Medicine

## 2022-04-29 ENCOUNTER — Ambulatory Visit (INDEPENDENT_AMBULATORY_CARE_PROVIDER_SITE_OTHER): Payer: Medicare Other

## 2022-04-29 DIAGNOSIS — M170 Bilateral primary osteoarthritis of knee: Secondary | ICD-10-CM | POA: Diagnosis not present

## 2022-04-29 NOTE — Progress Notes (Signed)
    Procedures performed today:    Procedure: Real-time Ultrasound Guided injection of the left knee Device: Samsung HS60  Verbal informed consent obtained.  Time-out conducted.  Noted no overlying erythema, induration, or other signs of local infection.  Skin prepped in a sterile fashion.  Local anesthesia: Topical Ethyl chloride.  With sterile technique and under real time ultrasound guidance: Mild effusion noted, 1 cc Kenalog 40, 2 cc lidocaine, 2 cc bupivacaine injected easily Completed without difficulty  Advised to call if fevers/chills, erythema, induration, drainage, or persistent bleeding.  Images permanently stored and available for review in PACS.  Impression: Technically successful ultrasound guided injection.  Procedure: Real-time Ultrasound Guided injection of the right knee Device: Samsung HS60  Verbal informed consent obtained.  Time-out conducted.  Noted no overlying erythema, induration, or other signs of local infection.  Skin prepped in a sterile fashion.  Local anesthesia: Topical Ethyl chloride.  With sterile technique and under real time ultrasound guidance: Mild effusion noted, 1 cc Kenalog 40, 2 cc lidocaine, 2 cc bupivacaine injected easily Completed without difficulty  Advised to call if fevers/chills, erythema, induration, drainage, or persistent bleeding.  Images permanently stored and available for review in PACS.  Impression: Technically successful ultrasound guided injection.  Independent interpretation of notes and tests performed by another provider:   None.  Brief History, Exam, Impression, and Recommendations:    Primary osteoarthritis of both knees This is a very pleasant 71 year old male, chronic bilateral knee pain, he did get some x-rays that showed osteoarthritis. On exam he has tenderness medial joint lines, minor swelling and bilateral varus deformity. He has tried Celebrex without sufficient improvement, today we injected both of his  knees, adding home conditioning, return to see me in 4 to 6 weeks to discuss viscosupplementation if not better.    ____________________________________________ Gwen Her. Dianah Field, M.D., ABFM., CAQSM., AME. Primary Care and Sports Medicine [facility]  Adjunct Professor of [employer] [facility] [employer]  Risk manager

## 2022-04-29 NOTE — Assessment & Plan Note (Signed)
This is a very pleasant 71 year old male, chronic bilateral knee pain, he did get some x-rays that showed osteoarthritis. On exam he has tenderness medial joint lines, minor swelling and bilateral varus deformity. He has tried Celebrex without sufficient improvement, today we injected both of his knees, adding home conditioning, return to see me in 4 to 6 weeks to discuss viscosupplementation if not better.

## 2022-05-01 ENCOUNTER — Ambulatory Visit (INDEPENDENT_AMBULATORY_CARE_PROVIDER_SITE_OTHER): Payer: Medicare Other | Admitting: Physician Assistant

## 2022-05-01 ENCOUNTER — Telehealth: Payer: Self-pay

## 2022-05-01 DIAGNOSIS — Z0000 Encounter for general adult medical examination without abnormal findings: Secondary | ICD-10-CM

## 2022-05-01 MED ORDER — CHLORPROMAZINE HCL 10 MG PO TABS: 10.0000 mg | ORAL_TABLET | Freq: Three times a day (TID) | ORAL | 0 refills | Status: DC

## 2022-05-01 NOTE — Telephone Encounter (Signed)
Patient called to report that he has gotten the hiccups from the injection. He does this with prednisone. He would like some meds for the hiccups.

## 2022-05-01 NOTE — Telephone Encounter (Signed)
Adding some Thorazine, if this does not help he needs to follow-up with his PCP as intractable hiccups can mean something else more dangerous.

## 2022-05-01 NOTE — Telephone Encounter (Signed)
Patient called and notified that medication was sent in.

## 2022-05-01 NOTE — Progress Notes (Signed)
MEDICARE ANNUAL WELLNESS VISIT  05/01/2022  Telephone Visit Disclaimer This Medicare AWV was conducted by telephone due to national recommendations for restrictions regarding the COVID-19 Pandemic (e.g. social distancing).  I verified, using two identifiers, that I am speaking with Jose Waller or their authorized healthcare agent. I discussed the limitations, risks, security, and privacy concerns of performing an evaluation and management service by telephone and the potential availability of an in-person appointment in the future. The patient expressed understanding and agreed to proceed.  Location of Patient: Home Location of Provider (nurse):  in the office  Subjective:    Jose Waller is a 71 y.o. male patient of Metheney, Barbarann Ehlers, MD who had a Medicare Annual Wellness Visit today via telephone. Jose Waller is Retired and lives alone. he has 6 children. he reports that he is socially active and does interact with friends/family regularly. he is minimally physically active and enjoys going to the flea market.  Patient Care Team: Agapito Games, MD as PCP - General     05/01/2022    8:08 AM 04/27/2021    8:13 AM 01/11/2021    9:33 AM 08/24/2018    2:10 PM 04/19/2015    8:28 AM  Advanced Directives  Does Patient Have a Medical Advance Directive? No No No No No  Would patient like information on creating a medical advance directive? No - Patient declined No - Patient declined No - Patient declined Yes (MAU/Ambulatory/Procedural Areas - Information given) Yes - Educational materials given    Hospital Utilization Over the Past 12 Months: # of hospitalizations or ER visits: 0 # of surgeries: 0  Review of Systems    Patient reports that his overall health is better compared to last year.  History obtained from chart review and the patient  Patient Reported Readings (BP, Pulse, CBG, Weight, etc) none  Pain Assessment Pain : No/denies pain     Current Medications &  Allergies (verified) Allergies as of 05/01/2022       Reactions   Prednisone    REACTION: hick ups        Medication List        Accurate as of May 01, 2022  8:18 AM. If you have any questions, ask your nurse or doctor.          amLODipine 10 MG tablet Commonly known as: NORVASC Take 1 tablet (10 mg total) by mouth daily.   atorvastatin 20 MG tablet Commonly known as: LIPITOR Take 1 tablet (20 mg total) by mouth at bedtime.   celecoxib 200 MG capsule Commonly known as: CeleBREX Take 1 capsule (200 mg total) by mouth 2 (two) times daily. For Gout and Athritis   diclofenac Sodium 1 % Gel Commonly known as: VOLTAREN APPLY 2 GRAMS TOPICALLY FOUR TIMES DAILY TO AFFECTED JOINT   Gemtesa 75 MG Tabs Generic drug: Vibegron Take 1 tablet by mouth daily.   omeprazole 40 MG capsule Commonly known as: PRILOSEC Take 1 capsule by mouth once daily   sildenafil 20 MG tablet Commonly known as: REVATIO Take 1-5 tablets (20-100 mg total) by mouth as needed.   valsartan 320 MG tablet Commonly known as: DIOVAN Take 1 tablet (320 mg total) by mouth daily.        History (reviewed): Past Medical History:  Diagnosis Date   Allergy    Arthritis    Cataract    MD just watching   CKD (chronic kidney disease) stage 3, GFR 30-59 ml/min (HCC)  06/05/2018   Cluster headache    resolved   Glaucoma    Hypertension    Substance abuse ([place])    cocaine - quit 1998   Past Surgical History:  Procedure Laterality Date   back lumbar  2004   fusion    COLONOSCOPY  06/09/2009   Dr Juleen Starr - normal   WISDOM TOOTH EXTRACTION     Family History  Problem Relation Age of Onset   Lung cancer Father        lung/ was a miner   Alcohol abuse Other    Hypertension Brother    Dementia Mother    Coronary artery disease Brother    Colon cancer Neg Hx    Stomach cancer Neg Hx    Rectal cancer Neg Hx    Social History   Socioeconomic History   Marital status: Widowed    Spouse  name: Not on file   Number of children: 6   Years of education: 70   Highest education level: 12th grade  Occupational History   Occupation: retired    Comment: Government social research officer  Tobacco Use   Smoking status: Former    Types: Cigarettes    Quit date: 07/29/2001    Years since quitting: 20.7   Smokeless tobacco: Never  Vaping Use   Vaping Use: Never used  Substance and Sexual Activity   Alcohol use: No   Drug use: Not Currently    Comment: Former cocaine user, quit 1998   Sexual activity: Yes  Other Topics Concern   Not on file  Social History Narrative   On disability. Wife passed away in a motor vehicle accident. He has six children. He enjoys going to the flea market.   Social Determinants of Health   Financial Resource Strain: Low Risk  (05/01/2022)   Overall Financial Resource Strain (CARDIA)    Difficulty of Paying Living Expenses: Not hard at all  Food Insecurity: No Food Insecurity (05/01/2022)   Hunger Vital Sign    Worried About Running Out of Food in the Last Year: Never true    Ran Out of Food in the Last Year: Never true  Transportation Needs: No Transportation Needs (04/27/2021)   PRAPARE - Hydrologist (Medical): No    Lack of Transportation (Non-Medical): No  Physical Activity: Inactive (05/01/2022)   Exercise Vital Sign    Days of Exercise per Week: 0 days    Minutes of Exercise per Session: 0 min  Stress: No Stress Concern Present (05/01/2022)   [place]    Feeling of Stress : Not at all  Social Connections: Moderately Isolated (05/01/2022)   Social Connection and Isolation Panel [NHANES]    Frequency of Communication with Friends and Family: More than three times a week    Frequency of Social Gatherings with Friends and Family: Twice a week    Attends Religious Services: More than 4 times per year    Active Member of [employer] or Organizations: No    Attends  Archivist Meetings: Never    Marital Status: Widowed    Activities of Daily Living    05/01/2022    8:09 AM  In your present state of health, do you have any difficulty performing the following activities:  Hearing? 0  Vision? 0  Difficulty concentrating or making decisions? 0  Walking or climbing stairs? 1  Comment knee pain.  Dressing or bathing? 0  Doing errands, shopping? 0  Preparing Food and eating ? N  Using the Toilet? N  In the past six months, have you accidently leaked urine? N  Do you have problems with loss of bowel control? N  Managing your Medications? N  Managing your Finances? N  Housekeeping or managing your Housekeeping? N    Patient Education/ Literacy How often do you need to have someone help you when you read instructions, pamphlets, or other written materials from your doctor or pharmacy?: 1 - Never What is the last grade level you completed in school?: 12th grade  Exercise Current Exercise Habits: The patient does not participate in regular exercise at present, Exercise limited by: orthopedic condition(s)  Diet Patient reports consuming 2 meals a day and 4 snack(s) a day Patient reports that his primary diet is: Regular Patient reports that she does have regular access to food.   Depression Screen    05/01/2022    8:09 AM 04/25/2022    8:32 AM 10/22/2021    7:29 AM 04/27/2021    8:13 AM 04/23/2021    1:33 PM 02/19/2021    7:15 AM 01/08/2021    7:33 AM  PHQ 2/9 Scores  PHQ - 2 Score 0 0 0 0 0 0 0     Fall Risk    05/01/2022    8:09 AM 04/25/2022    8:32 AM 10/22/2021    7:29 AM 04/27/2021    8:13 AM 04/23/2021    1:32 PM  [place] in the past year? 0 0 0 0 0  Number falls in past yr: 0 0 0 0 0  Injury with Fall? 0 0 0 0 0  Risk for fall due to : No Fall Risks No Fall Risks No Fall Risks No Fall Risks No Fall Risks  Follow up Falls evaluation completed Falls evaluation completed Falls prevention discussed Falls evaluation  completed Falls prevention discussed;Falls evaluation completed     Objective:  Jose Waller seemed alert and oriented and he participated appropriately during our telephone visit.  Blood Pressure Weight BMI  BP Readings from Last 3 Encounters:  04/25/22 132/88  02/21/22 138/84  10/22/21 (!) 165/100   Wt Readings from Last 3 Encounters:  04/25/22 208 lb (94.3 kg)  02/21/22 210 lb (95.3 kg)  10/22/21 207 lb (93.9 kg)   BMI Readings from Last 1 Encounters:  04/25/22 33.57 kg/m    *Unable to obtain current vital signs, weight, and BMI due to telephone visit type  Hearing/Vision  Jose Waller did not seem to have difficulty with hearing/understanding during the telephone conversation Reports that he has not had a formal eye exam by an eye care professional within the past year Reports that he has not had a formal hearing evaluation within the past year *Unable to fully assess hearing and vision during telephone visit type  Cognitive Function:    05/01/2022    8:12 AM 04/27/2021    8:17 AM 08/24/2018    2:16 PM  6CIT Screen  What Year? 0 points 4 points 0 points  What month? 0 points 0 points 0 points  What time? 0 points 0 points 0 points  Count back from 20 0 points 0 points 0 points  Months in reverse 4 points 0 points 0 points  Repeat phrase 0 points 0 points 0 points  Total Score 4 points 4 points 0 points   (Normal:0-7, Significant for Dysfunction: >8)  Normal Cognitive Function Screening: Yes  Immunization & Health Maintenance Record Immunization History  Administered Date(s) Administered   Moderna Sars-Covid-2 Vaccination 02/26/2020, 03/24/2020, 09/13/2020   Td 05/02/2008   Zoster, Live 02/12/2011    Health Maintenance  Topic Date Due   COVID-19 Vaccine (4 - Moderna risk series) 05/09/2022 (Originally 11/08/2020)   TETANUS/TDAP  07/28/2022 (Originally 05/02/2018)   Pneumonia Vaccine 37+ Years old (1 - PCV) 10/23/2022 (Originally 12/13/2013)   INFLUENZA VACCINE   10/27/2022 (Originally 02/26/2022)   Zoster Vaccines- Shingrix (1 of 2) 07/26/2023 (Originally 12/14/1969)   COLONOSCOPY (Pts 45-17yrs Insurance coverage will need to be confirmed)  08/29/2024   Hepatitis C Screening  Completed   HPV VACCINES  Aged Out       Assessment  This is a routine wellness examination for Jose Waller.  Health Maintenance: Due or Overdue There are no preventive care reminders to display for this patient.  Jose Waller does not need a referral for Community Assistance: Care Management:   no Social Work:    no Prescription Assistance:  no Nutrition/Diabetes Education:  no   Plan:  Personalized Goals  Goals Addressed               This Visit's Progress     Patient Stated (pt-stated)        05/01/2022 AWV Goal: Exercise for General Health  Patient will verbalize understanding of the benefits of increased physical activity: Exercising regularly is important. It will improve your overall fitness, flexibility, and endurance. Regular exercise also will improve your overall health. It can help you control your weight, reduce stress, and improve your bone density. Over the next year, patient will increase physical activity as tolerated with a goal of at least 150 minutes of moderate physical activity per week.  You can tell that you are exercising at a moderate intensity if your heart starts beating faster and you start breathing faster but can still hold a conversation. Moderate-intensity exercise ideas include: Walking 1 mile (1.6 km) in about 15 minutes Biking Hiking Golfing Dancing Water aerobics Patient will verbalize understanding of everyday activities that increase physical activity by providing examples like the following: Yard work, such as: Sales promotion account executive Gardening Washing windows or floors Patient will be able to explain general safety guidelines for  exercising:  Before you start a new exercise program, talk with your health care provider. Do not exercise so much that you hurt yourself, feel dizzy, or get very short of breath. Wear comfortable clothes and wear shoes with good support. Drink plenty of water while you exercise to prevent dehydration or heat stroke. Work out until your breathing and your heartbeat get faster.        Personalized Health Maintenance & Screening Recommendations  TD vaccine Pneumonia vaccine Influenza vaccine Shingles vaccine  Patient declined the vaccines at this time.  Lung Cancer Screening Recommended: no (Low Dose CT Chest recommended if Age 44-80 years, 30 pack-year currently smoking OR have quit w/in past 15 years) Hepatitis C Screening recommended: no HIV Screening recommended: no  Advanced Directives: Written information was not prepared per patient's request.  Referrals & Orders No orders of the defined types were placed in this encounter.   Follow-up Plan Follow-up with Hali Marry, MD as planned Medicare wellness visit in one year. AVS printed and mailed to the patient.   I have personally reviewed and noted the following in the patient's chart:   Medical  and social history Use of alcohol, tobacco or illicit drugs  Current medications and supplements Functional ability and status Nutritional status Physical activity Advanced directives List of other physicians Hospitalizations, surgeries, and ER visits in previous 12 months Vitals Screenings to include cognitive, depression, and falls Referrals and appointments  In addition, I have reviewed and discussed with Jose Waller certain preventive protocols, quality metrics, and best practice recommendations. A written personalized care plan for preventive services as well as general preventive health recommendations is available and can be mailed to the patient at his request.      Tinnie Gens, RN  BSN  05/01/2022

## 2022-05-01 NOTE — Patient Instructions (Addendum)
MEDICARE ANNUAL WELLNESS VISIT Health Maintenance Summary and Written Plan of Care  Jose Waller ,  Thank you for allowing me to perform your Medicare Annual Wellness Visit and for your ongoing commitment to your health.   Health Maintenance & Immunization History Health Maintenance  Topic Date Due  . COVID-19 Vaccine (4 - Moderna risk series) 05/09/2022 (Originally 11/08/2020)  . TETANUS/TDAP  07/28/2022 (Originally 05/02/2018)  . Pneumonia Vaccine 75+ Years old (1 - PCV) 10/23/2022 (Originally 12/13/2013)  . INFLUENZA VACCINE  10/27/2022 (Originally 02/26/2022)  . Zoster Vaccines- Shingrix (1 of 2) 07/26/2023 (Originally 12/14/1967)  . COLONOSCOPY (Pts 45-26yrs Insurance coverage will need to be confirmed)  08/29/2024  . Hepatitis C Screening  Completed  . HPV VACCINES  Aged Out   Immunization History  Administered Date(s) Administered  . Moderna Sars-Covid-2 Vaccination 02/26/2020, 03/24/2020, 09/13/2020  . Td 05/02/2008  . Zoster, Live 02/12/2011    These are the patient goals that we discussed:  Goals Addressed              This Visit's Progress   .  Patient Stated (pt-stated)        05/01/2022 AWV Goal: Exercise for General Health  Patient will verbalize understanding of the benefits of increased physical activity: Exercising regularly is important. It will improve your overall fitness, flexibility, and endurance. Regular exercise also will improve your overall health. It can help you control your weight, reduce stress, and improve your bone density. Over the next year, patient will increase physical activity as tolerated with a goal of at least 150 minutes of moderate physical activity per week.  You can tell that you are exercising at a moderate intensity if your heart starts beating faster and you start breathing faster but can still hold a conversation. Moderate-intensity exercise ideas include: Walking 1 mile (1.6 km) in about 15  minutes Biking Hiking Golfing Dancing Water aerobics Patient will verbalize understanding of everyday activities that increase physical activity by providing examples like the following: Yard work, such as: Insurance underwriter Gardening Washing windows or floors Patient will be able to explain general safety guidelines for exercising:  Before you start a new exercise program, talk with your health care provider. Do not exercise so much that you hurt yourself, feel dizzy, or get very short of breath. Wear comfortable clothes and wear shoes with good support. Drink plenty of water while you exercise to prevent dehydration or heat stroke. Work out until your breathing and your heartbeat get faster.         This is a list of Health Maintenance Items that are overdue or due now: TD vaccine Pneumonia vaccine Influenza vaccine Shingles vaccine  Patient declined the vaccines at this time.  Orders/Referrals Placed Today: No orders of the defined types were placed in this encounter.  (Contact our referral department at [phone] if you have not spoken with someone about your referral appointment within the next 5 days)    Follow-up Plan Follow-up with Agapito Games, MD as planned Medicare wellness visit in one year. AVS printed and mailed to the patient.      Health Maintenance, Male Adopting a healthy lifestyle and getting preventive care are important in promoting health and wellness. Ask your health care provider about: The right schedule for you to have regular tests and exams. Things you can do on your own to prevent diseases and keep yourself healthy. What should  I know about diet, weight, and exercise? Eat a healthy diet  Eat a diet that includes plenty of vegetables, fruits, low-fat dairy products, and lean protein. Do not eat a lot of foods that are high in solid fats, added  sugars, or sodium. Maintain a healthy weight Body mass index (BMI) is a measurement that can be used to identify possible weight problems. It estimates body fat based on height and weight. Your health care provider can help determine your BMI and help you achieve or maintain a healthy weight. Get regular exercise Get regular exercise. This is one of the most important things you can do for your health. Most adults should: Exercise for at least 150 minutes each week. The exercise should increase your heart rate and make you sweat (moderate-intensity exercise). Do strengthening exercises at least twice a week. This is in addition to the moderate-intensity exercise. Spend less time sitting. Even light physical activity can be beneficial. Watch cholesterol and blood lipids Have your blood tested for lipids and cholesterol at 73 years of age, then have this test every 5 years. You may need to have your cholesterol levels checked more often if: Your lipid or cholesterol levels are high. You are older than 73 years of age. You are at high risk for heart disease. What should I know about cancer screening? Many types of cancers can be detected early and may often be prevented. Depending on your health history and family history, you may need to have cancer screening at various ages. This may include screening for: Colorectal cancer. Prostate cancer. Skin cancer. Lung cancer. What should I know about heart disease, diabetes, and high blood pressure? Blood pressure and heart disease High blood pressure causes heart disease and increases the risk of stroke. This is more likely to develop in people who have high blood pressure readings or are overweight. Talk with your health care provider about your target blood pressure readings. Have your blood pressure checked: Every 3-5 years if you are 67-35 years of age. Every year if you are 8 years old or older. If you are between the ages of 73 and 48 and  are a current or former smoker, ask your health care provider if you should have a one-time screening for abdominal aortic aneurysm (AAA). Diabetes Have regular diabetes screenings. This checks your fasting blood sugar level. Have the screening done: Once every three years after age 44 if you are at a normal weight and have a low risk for diabetes. More often and at a younger age if you are overweight or have a high risk for diabetes. What should I know about preventing infection? Hepatitis B If you have a higher risk for hepatitis B, you should be screened for this virus. Talk with your health care provider to find out if you are at risk for hepatitis B infection. Hepatitis C Blood testing is recommended for: Everyone born from [birth year] through 1965. Anyone with known risk factors for hepatitis C. Sexually transmitted infections (STIs) You should be screened each year for STIs, including gonorrhea and chlamydia, if: You are sexually active and are younger than 73 years of age. You are older than 73 years of age and your health care provider tells you that you are at risk for this type of infection. Your sexual activity has changed since you were last screened, and you are at increased risk for chlamydia or gonorrhea. Ask your health care provider if you are at risk. Ask your health care provider  about whether you are at high risk for HIV. Your health care provider may recommend a prescription medicine to help prevent HIV infection. If you choose to take medicine to prevent HIV, you should first get tested for HIV. You should then be tested every 3 months for as long as you are taking the medicine. Follow these instructions at home: Alcohol use Do not drink alcohol if your health care provider tells you not to drink. If you drink alcohol: Limit how much you have to 0-2 drinks a day. Know how much alcohol is in your drink. In the [place] one drink equals one 12 oz bottle of beer (355 mL), one 5 oz  glass of wine (148 mL), or one 1 oz glass of hard liquor (44 mL). Lifestyle Do not use any products that contain nicotine or tobacco. These products include cigarettes, chewing tobacco, and vaping devices, such as e-cigarettes. If you need help quitting, ask your health care provider. Do not use street drugs. Do not share needles. Ask your health care provider for help if you need support or information about quitting drugs. General instructions Schedule regular health, dental, and eye exams. Stay current with your vaccines. Tell your health care provider if: You often feel depressed. You have ever been abused or do not feel safe at home. Summary Adopting a healthy lifestyle and getting preventive care are important in promoting health and wellness. Follow your health care provider's instructions about healthy diet, exercising, and getting tested or screened for diseases. Follow your health care provider's instructions on monitoring your cholesterol and blood pressure. This information is not intended to replace advice given to you by your health care provider. Make sure you discuss any questions you have with your health care provider. Document Revised: 12/04/2020 Document Reviewed: 12/04/2020 Elsevier Patient Education  2023 [employer].

## 2022-05-13 IMAGING — MR MR HEAD WO/W CM
13 series · 48 of 48 positions shown · IV contrast (gadavist)
Comparison: None.

CLINICAL DATA: Neuro deficit, acute stroke suspected.

EXAM:
MRI HEAD WITHOUT AND WITH CONTRAST
TECHNIQUE: Multiplanar, multiecho pulse sequences of the brain and surrounding
structures were obtained without and with intravenous contrast.
CONTRAST:  10mL GADAVIST GADOBUTROL 1 MMOL/ML IV SOLN

[Series 2: DWI · axial · 3.0mm · 1.46mm/px · z∈[-45,+115]mm · 7 of 110 slices shown (1 of 4)]
[im 1/110]
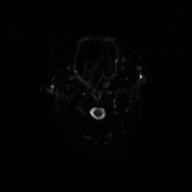
[im 19/110]
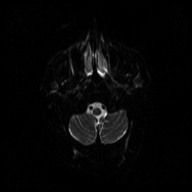
[im 37/110]
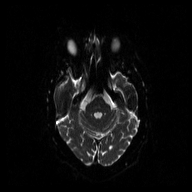
[im 55/110]
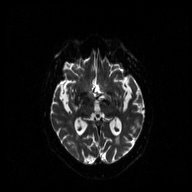
[im 73/110]
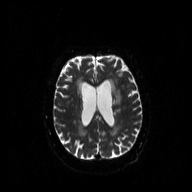
[im 91/110]
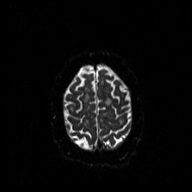
[im 110/110]
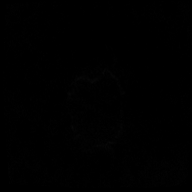

[Series 3: DWI · axial · 3.0mm · 1.46mm/px · z∈[-45,+115]mm · 4 of 55 slices shown (2 of 4)]
[im 1/55]
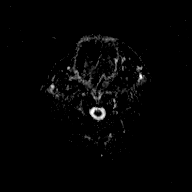
[im 19/55]
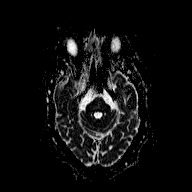
[im 37/55]
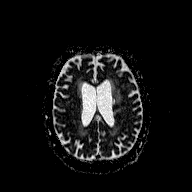
[im 55/55]
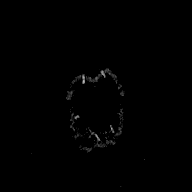

[Series 4: DWI · coronal · 3.0mm · 1.46mm/px · 5 of 94 slices shown (3 of 4)]
[im 1/94]
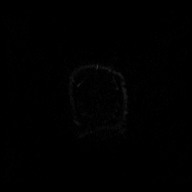
[im 24/94]
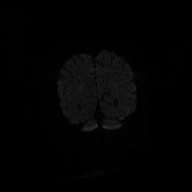
[im 47/94]
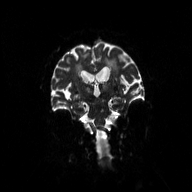
[im 70/94]
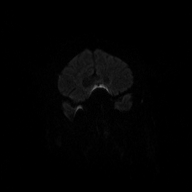
[im 94/94]
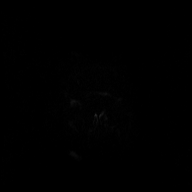

[Series 5: DWI · coronal · 3.0mm · 1.46mm/px · 3 of 48 slices shown (4 of 4)]
[im 1/48]
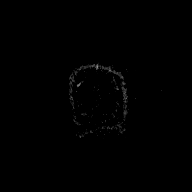
[im 24/48]
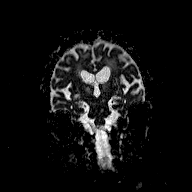
[im 48/48]
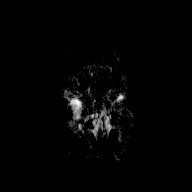

[Series 6: T1 · sagittal · 5.0mm · 0.47mm/px · 1 of 23 slices shown (1 of 2)]
[im 1/23]
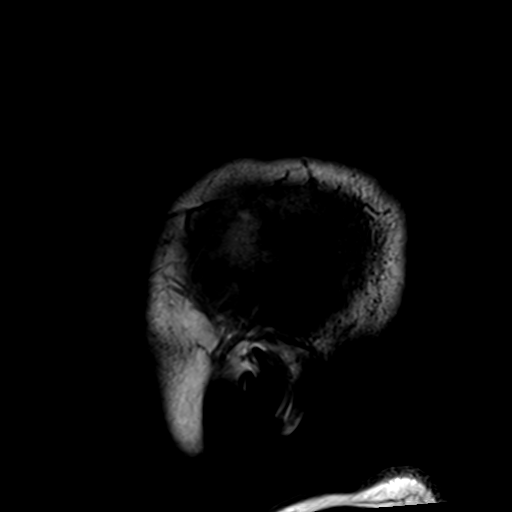

[Series 7: T2 · axial · 5.0mm · 0.75mm/px · 1 of 24 slices shown (1 of 3)]
[im 1/24]
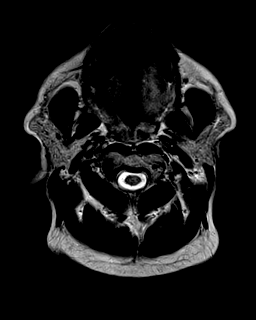

[Series 8: FLAIR · axial · 3.0mm · 0.47mm/px · z∈[-46,+116]mm · 3 of 55 slices shown]
[im 1/55]
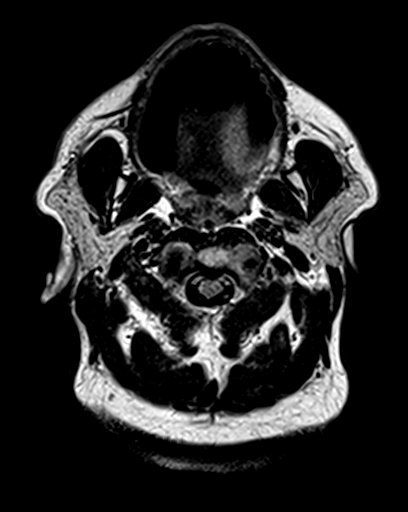
[im 28/55]
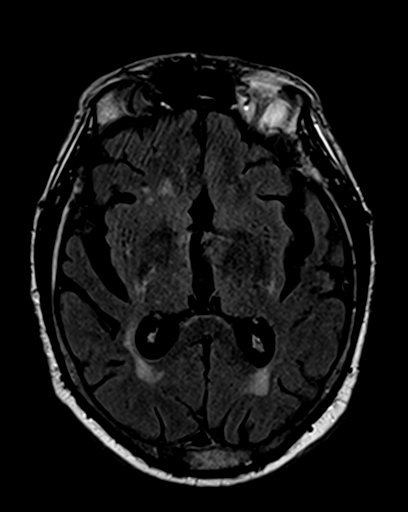
[im 55/55]
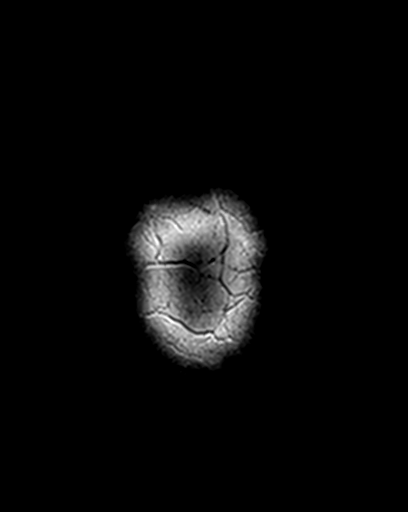

[Series 9: T2 · axial · 5.0mm · 0.75mm/px · 1 of 25 slices shown (2 of 3)]
[im 1/25]
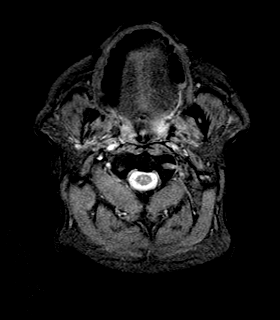

[Series 10: T1 · axial · 1.0mm · 0.94mm/px · z∈[-44,+114]mm · 9 of 160 slices shown (2 of 2)]
[im 1/160]
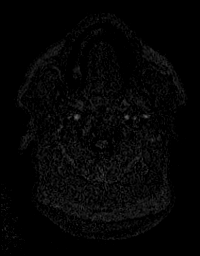
[im 20/160]
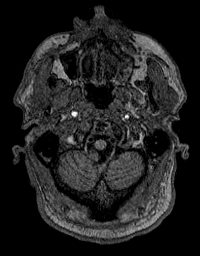
[im 40/160]
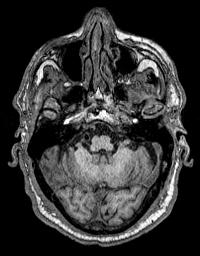
[im 60/160]
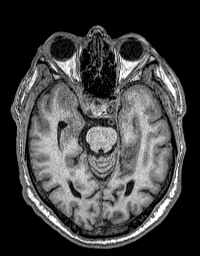
[im 80/160]
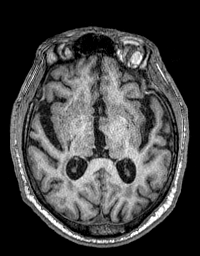
[im 100/160]
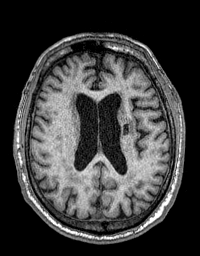
[im 120/160]
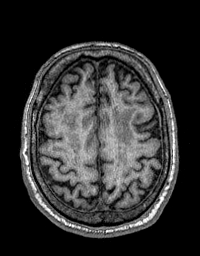
[im 140/160]
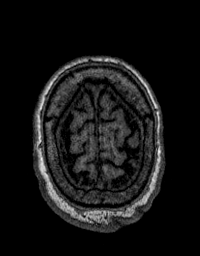
[im 160/160]
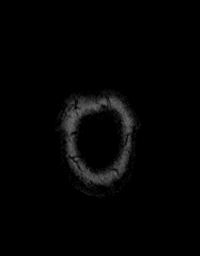

[Series 11: T2 · axial · 5.0mm · 0.94mm/px · 1 of 25 slices shown (3 of 3)]
[im 1/25]
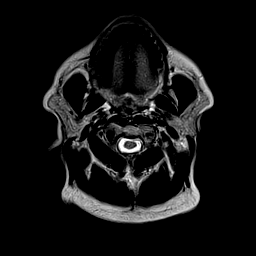

[Series 12: T2 post-contrast · coronal · 5.0mm · 0.86mm/px · 2 of 31 slices shown]
[im 1/31]
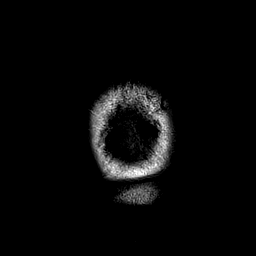
[im 31/31]
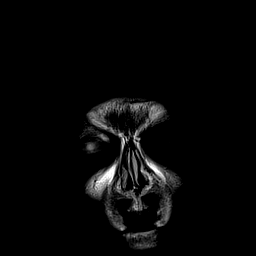

[Series 13: T1 post-contrast · axial · 1.0mm · 0.94mm/px · z∈[-44,+114]mm · 9 of 160 slices shown (1 of 2)]
[im 1/160]
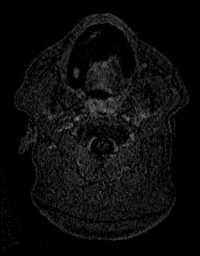
[im 20/160]
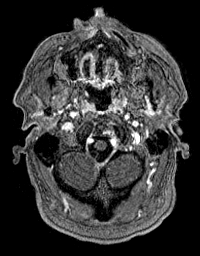
[im 40/160]
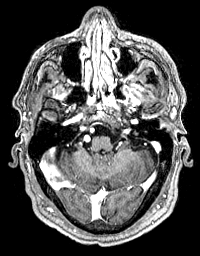
[im 60/160]
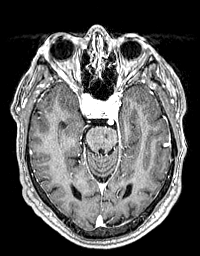
[im 80/160]
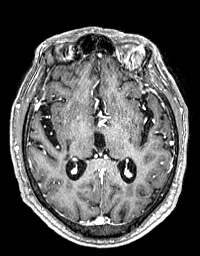
[im 100/160]
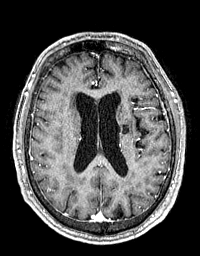
[im 120/160]
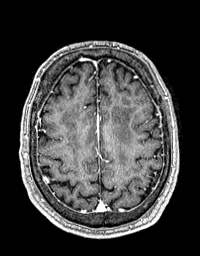
[im 140/160]
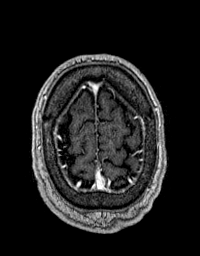
[im 160/160]
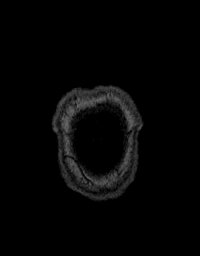

[Series 14: T1 post-contrast · coronal · 5.0mm · 0.43mm/px · 2 of 31 slices shown (2 of 2)]
[im 1/31]
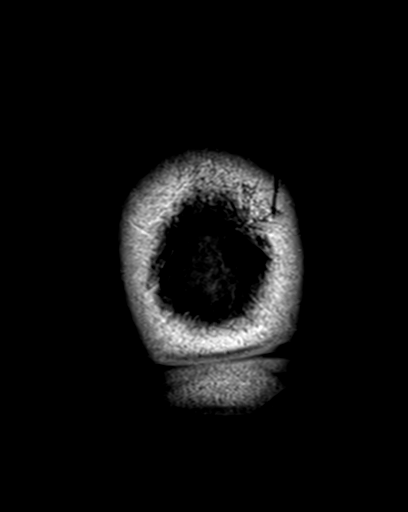
[im 31/31]
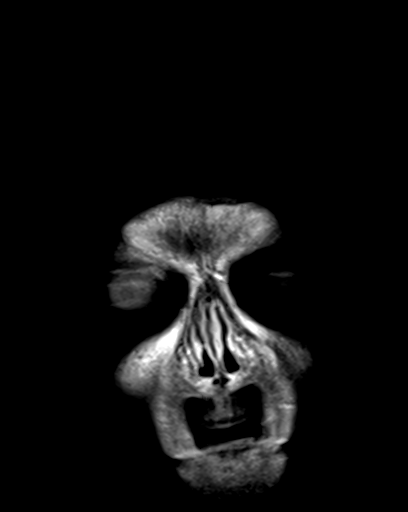

[48 of 48 positions shown; findings below may reference images not displayed]

FINDINGS: Brain: No evidence of acute infarct. Faint area of DWI
hyperintensity in the right frontal lobe has correlate ADC
hyperintensity, compatible with artifactual T2 shine through. Remote
infarct in the left corona radiata. T2 hyperintensity in the
inferior right basal ganglia, most likely a dilated perivascular
space given characteristic location. Moderate patchy and confluent
additional T2/FLAIR hyperintensities within the white matter, likely
related to chronic microvascular ischemic change. Mild to moderate
for age atrophy with ex vacuo ventricular dilation. No
hydrocephalus, acute hemorrhage, mass lesion, abnormal mass effect,
or extra-axial fluid collection.

Vascular: Major arterial flow voids are maintained at the skull
base.

Skull and upper cervical spine: Normal marrow signal.

Sinuses/Orbits: Mild ethmoid air cell mucosal thickening. Atretic
left maxillary sinus. Unremarkable orbits.

Other: No mastoid effusions.
IMPRESSION: 1. No evidence of acute intracranial abnormality.
2. Remote left corona radiata infarct and moderate chronic
microvascular ischemic disease.

## 2022-05-23 ENCOUNTER — Ambulatory Visit (INDEPENDENT_AMBULATORY_CARE_PROVIDER_SITE_OTHER): Payer: Medicare Other | Admitting: Family Medicine

## 2022-05-23 VITALS — BP 101/71 | HR 101 | Ht 66.0 in

## 2022-05-23 DIAGNOSIS — I10 Essential (primary) hypertension: Secondary | ICD-10-CM

## 2022-05-23 NOTE — Patient Instructions (Signed)
Hypertension - BP check -  BP good in office today 101/71. Per Dr. Madilyn Fireman should be taking ONE tablet amlodipine 10mg  and ONE tablet of valstartan 320mg  daily. Patient should schedule appointment with Dr. Madilyn Fireman for medication review and bring ALL medications to this appointment. Patient already had an appt schld for 05/30/22 and will bring all medications to this appointment.

## 2022-05-23 NOTE — Progress Notes (Signed)
   Established Patient Office Visit  Subjective   Patient ID: YOVANY CLOCK, male    DOB: 05-26-1951  Age: 71 y.o. MRN: [id number]  Chief Complaint  Patient presents with   Hypertension    Nurse visit BP check - questioned pt about medications - states he had doubled the valsartan  and not the amlodipine. In chart shows should have doubled amlodipine and not valsartan.     HPI    ROS    Objective:     BP 101/71   Pulse (!) 101   Ht 5\' 6"  (1.676 m)   SpO2 99%   BMI 33.57 kg/m    Physical Exam   No results found for any visits on 05/23/22.    The 10-year ASCVD risk score (Arnett DK, et al., 2019) is: 13.5%    Assessment & Plan:  Hypertension - BP check -  BP good in office today 101/71. When questioned about medications - he had doubled valsartan instead of amlodipine as directed. Per Dr. Madilyn Fireman should be taking ONE tablet amlodipine 10mg  and ONE tablet of valstartan 320mg  daily. Patient should schedule appointment with Dr. Madilyn Fireman for medication review and bring ALL medications to this appointment. Patient already had an appt schld for 05/30/22 and will bring all medications to this appointment.  Problem List Items Addressed This Visit   None   Return in about 1 week (around 05/30/2022) for return on 05/30/22 schld appt with all medications for review by provider .    Rae Lips, LPN

## 2022-05-23 NOTE — Progress Notes (Signed)
There is clearly some confusion around the medications.  So encouraged him to bring them in to make another nurse appointment to go through the medications and make sure that they are up-to-date and accurate and toss out any old medications

## 2022-05-30 ENCOUNTER — Encounter: Payer: Self-pay | Admitting: Family Medicine

## 2022-05-30 ENCOUNTER — Ambulatory Visit (INDEPENDENT_AMBULATORY_CARE_PROVIDER_SITE_OTHER): Payer: Medicare Other | Admitting: Family Medicine

## 2022-05-30 VITALS — BP 124/69 | HR 105 | Ht 66.0 in | Wt 203.0 lb

## 2022-05-30 DIAGNOSIS — R4782 Fluency disorder in conditions classified elsewhere: Secondary | ICD-10-CM

## 2022-05-30 DIAGNOSIS — I10 Essential (primary) hypertension: Secondary | ICD-10-CM

## 2022-05-30 DIAGNOSIS — R4781 Slurred speech: Secondary | ICD-10-CM | POA: Diagnosis not present

## 2022-05-30 NOTE — Progress Notes (Signed)
Pt would like referral to speech therapy

## 2022-05-30 NOTE — Assessment & Plan Note (Signed)
Blood pressure looks fantastic today.  Continue current regimen.  Follow-up in 6 months.

## 2022-05-30 NOTE — Assessment & Plan Note (Signed)
Place referral for speech therapy.

## 2022-05-30 NOTE — Progress Notes (Signed)
   Established Patient Office Visit  Subjective   Patient ID: Jose Waller, male    DOB: 09/23/1950  Age: 71 y.o. MRN: [id number]  Chief Complaint  Patient presents with   Referral    HPI   F/U HTN -tolerating amlodipine well without any problems or side effects he did pick up the 10 mg tablet still has a few fives that he is finishing out he is just been taking 2.  Blood pressures have looked great.  He is now engaged in aquatic therapy 3 times a week and has been really enjoying it he says he feels better and feels stronger.  His knee is better his headaches have improved.  He is interested in doing some speech therapy again.  Some speech therapy initially after he had what was the vault to most likely be a stroke.  He would like to go back to speech therapy and start working on it again.     ROS    Objective:     BP 124/69   Pulse (!) 105   Ht 5\' 6"  (1.676 m)   Wt 203 lb (92.1 kg)   SpO2 100%   BMI 32.77 kg/m    Physical Exam Constitutional:      Appearance: He is well-developed.  HENT:     Head: Normocephalic and atraumatic.  Cardiovascular:     Rate and Rhythm: Normal rate and regular rhythm.     Heart sounds: Normal heart sounds.  Pulmonary:     Effort: Pulmonary effort is normal.     Breath sounds: Normal breath sounds.  Skin:    General: Skin is warm and dry.  Neurological:     Mental Status: He is alert and oriented to person, place, and time.  Psychiatric:        Behavior: Behavior normal.      No results found for any visits on 05/30/22.    The 10-year ASCVD risk score (Arnett DK, et al., 2019) is: 19.3%    Assessment & Plan:   Problem List Items Addressed This Visit       Cardiovascular and Mediastinum   HYPERTENSION - Primary    Blood pressure looks fantastic today.  Continue current regimen.  Follow-up in 6 months.        Other   Slurred speech    Place referral for speech therapy.      Relevant Orders   Ambulatory  referral to Speech Therapy   Other Visit Diagnoses     Fluency disorder associated with underlying disease       Relevant Orders   Ambulatory referral to Speech Therapy       Return in about 5 months (around 10/29/2022) for Hypertension, and Prediabetes .    Beatrice Lecher, MD

## 2022-06-10 ENCOUNTER — Encounter: Payer: Self-pay | Admitting: Sports Medicine

## 2022-06-10 ENCOUNTER — Ambulatory Visit (INDEPENDENT_AMBULATORY_CARE_PROVIDER_SITE_OTHER): Payer: Medicare Other | Admitting: Sports Medicine

## 2022-06-10 VITALS — Wt 203.0 lb

## 2022-06-10 DIAGNOSIS — M170 Bilateral primary osteoarthritis of knee: Secondary | ICD-10-CM

## 2022-06-10 NOTE — Progress Notes (Signed)
    Procedures performed today:    None.  Independent interpretation of notes and tests performed by another provider:   None.  Brief History, Exam, Impression, and Recommendations:    Primary osteoarthritis of both knees Jose Waller returns, he is a very pleasant 71 year old male, chronic bilateral knee pain with osteoarthritis on x-rays, he had tried Celebrex without sufficient improvement so I injected his knees at the last visit, returns today mostly pain-free. He will continue with water aerobics, and return to see me as needed.    ____________________________________________ Jose Waller. Jose Waller, M.D., ABFM., CAQSM., AME. Primary Care and Sports Medicine [facility]  Adjunct Professor of Family Medicine  [employer]  Restaurant manager, fast food

## 2022-06-10 NOTE — Assessment & Plan Note (Signed)
Jose Waller returns, he is a very pleasant 71 year old male, chronic bilateral knee pain with osteoarthritis on x-rays, he had tried Celebrex without sufficient improvement so I injected his knees at the last visit, returns today mostly pain-free. He will continue with water aerobics, and return to see me as needed.

## 2022-06-17 ENCOUNTER — Encounter: Payer: Self-pay | Admitting: Speech Pathology

## 2022-06-17 ENCOUNTER — Ambulatory Visit: Payer: Medicare Other | Admitting: Speech Pathology

## 2022-06-17 DIAGNOSIS — R471 Dysarthria and anarthria: Secondary | ICD-10-CM

## 2022-06-17 NOTE — Therapy (Signed)
Dianah Field arrived for ST eval. He expressed concerns about the distance from his home in [place] and does not want to drive the distance here for ST. I phoned [facility] in [place]. They will accept him and scheduled for ST tomorrow. Referral faxed.   Clinton Sawyer SLP

## 2022-06-18 DIAGNOSIS — R4781 Slurred speech: Secondary | ICD-10-CM | POA: Diagnosis not present

## 2022-07-01 DIAGNOSIS — R3914 Feeling of incomplete bladder emptying: Secondary | ICD-10-CM | POA: Diagnosis not present

## 2022-07-01 DIAGNOSIS — R350 Frequency of micturition: Secondary | ICD-10-CM | POA: Diagnosis not present

## 2022-07-02 DIAGNOSIS — R4781 Slurred speech: Secondary | ICD-10-CM | POA: Diagnosis not present

## 2022-07-09 DIAGNOSIS — R4781 Slurred speech: Secondary | ICD-10-CM | POA: Diagnosis not present

## 2022-07-16 DIAGNOSIS — R4781 Slurred speech: Secondary | ICD-10-CM | POA: Diagnosis not present

## 2022-08-01 DIAGNOSIS — R4781 Slurred speech: Secondary | ICD-10-CM | POA: Diagnosis not present

## 2022-08-04 ENCOUNTER — Other Ambulatory Visit: Payer: Self-pay | Admitting: Family Medicine

## 2022-08-04 DIAGNOSIS — G8929 Other chronic pain: Secondary | ICD-10-CM

## 2022-08-08 DIAGNOSIS — R4781 Slurred speech: Secondary | ICD-10-CM | POA: Diagnosis not present

## 2022-08-15 DIAGNOSIS — R4781 Slurred speech: Secondary | ICD-10-CM | POA: Diagnosis not present

## 2022-08-27 ENCOUNTER — Other Ambulatory Visit: Payer: Self-pay | Admitting: Family Medicine

## 2022-08-27 DIAGNOSIS — G8929 Other chronic pain: Secondary | ICD-10-CM

## 2022-08-29 ENCOUNTER — Other Ambulatory Visit: Payer: Self-pay | Admitting: Family Medicine

## 2022-08-29 DIAGNOSIS — G8929 Other chronic pain: Secondary | ICD-10-CM

## 2022-08-29 DIAGNOSIS — R4781 Slurred speech: Secondary | ICD-10-CM | POA: Diagnosis not present

## 2022-09-05 DIAGNOSIS — R4781 Slurred speech: Secondary | ICD-10-CM | POA: Diagnosis not present

## 2022-09-12 DIAGNOSIS — R4781 Slurred speech: Secondary | ICD-10-CM | POA: Diagnosis not present

## 2022-09-19 DIAGNOSIS — R4781 Slurred speech: Secondary | ICD-10-CM | POA: Diagnosis not present

## 2022-09-26 DIAGNOSIS — R4781 Slurred speech: Secondary | ICD-10-CM | POA: Diagnosis not present

## 2022-09-27 ENCOUNTER — Ambulatory Visit (INDEPENDENT_AMBULATORY_CARE_PROVIDER_SITE_OTHER): Payer: 59 | Admitting: Sports Medicine

## 2022-09-27 ENCOUNTER — Ambulatory Visit (INDEPENDENT_AMBULATORY_CARE_PROVIDER_SITE_OTHER): Payer: 59

## 2022-09-27 DIAGNOSIS — G8929 Other chronic pain: Secondary | ICD-10-CM | POA: Diagnosis not present

## 2022-09-27 DIAGNOSIS — M25562 Pain in left knee: Secondary | ICD-10-CM | POA: Diagnosis not present

## 2022-09-27 DIAGNOSIS — M170 Bilateral primary osteoarthritis of knee: Secondary | ICD-10-CM

## 2022-09-27 MED ORDER — CELECOXIB 200 MG PO CAPS: 200.0000 mg | ORAL_CAPSULE | Freq: Two times a day (BID) | ORAL | 3 refills | Status: DC

## 2022-09-27 MED ORDER — TRIAMCINOLONE ACETONIDE 40 MG/ML IJ SUSP
40.0000 mg | Freq: Once | INTRAMUSCULAR | Status: AC
  Administered 2022-09-27: 40 mg via INTRAMUSCULAR

## 2022-09-27 NOTE — Assessment & Plan Note (Signed)
Pleasant 72 year old male, bilateral knee osteoarthritis, last injected in October, Celebrex has been effective, he takes it twice a day, right knee is doing well, left knee with a recurrence of pain. Injected today, return to see me as needed. Continue water aerobics.

## 2022-09-27 NOTE — Progress Notes (Signed)
    Procedures performed today:    Procedure: Real-time Ultrasound Guided injection of the left knee Device: Samsung HS60  Verbal informed consent obtained.  Time-out conducted.  Noted no overlying erythema, induration, or other signs of local infection.  Skin prepped in a sterile fashion.  Local anesthesia: Topical Ethyl chloride.  With sterile technique and under real time ultrasound guidance: Mild effusion noted, 1 cc Kenalog 40, 2 cc lidocaine, 2 cc bupivacaine injected easily Completed without difficulty  Advised to call if fevers/chills, erythema, induration, drainage, or persistent bleeding.  Images permanently stored and available for review in PACS.  Impression: Technically successful ultrasound guided injection.  Independent interpretation of notes and tests performed by another provider:   None.  Brief History, Exam, Impression, and Recommendations:    Primary osteoarthritis of both knees Pleasant 72 year old male, bilateral knee osteoarthritis, last injected in October, Celebrex has been effective, he takes it twice a day, right knee is doing well, left knee with a recurrence of pain. Injected today, return to see me as needed. Continue water aerobics.    ____________________________________________ Gwen Her. Dianah Field, M.D., ABFM., CAQSM., AME. Primary Care and Sports Medicine [facility]  Adjunct Professor of [employer] of [employer]  Risk manager

## 2022-10-24 ENCOUNTER — Ambulatory Visit (INDEPENDENT_AMBULATORY_CARE_PROVIDER_SITE_OTHER): Payer: 59 | Admitting: Family Medicine

## 2022-10-24 ENCOUNTER — Encounter: Payer: Self-pay | Admitting: Family Medicine

## 2022-10-24 VITALS — BP 135/79 | HR 98 | Ht 66.0 in | Wt 205.0 lb

## 2022-10-24 DIAGNOSIS — R7301 Impaired fasting glucose: Secondary | ICD-10-CM | POA: Diagnosis not present

## 2022-10-24 DIAGNOSIS — N1831 Chronic kidney disease, stage 3a: Secondary | ICD-10-CM | POA: Diagnosis not present

## 2022-10-24 DIAGNOSIS — R4781 Slurred speech: Secondary | ICD-10-CM

## 2022-10-24 DIAGNOSIS — I10 Essential (primary) hypertension: Secondary | ICD-10-CM | POA: Diagnosis not present

## 2022-10-24 LAB — POCT GLYCOSYLATED HEMOGLOBIN (HGB A1C): Hemoglobin A1C: 5.4 % (ref 4.0–5.6)

## 2022-10-24 NOTE — Assessment & Plan Note (Signed)
He did complete speech therapy and is definitely enunciating his words better.

## 2022-10-24 NOTE — Progress Notes (Signed)
   Established Patient Office Visit  Subjective   Patient ID: Jose Waller, male    DOB: Dec 24, 1950  Age: 72 y.o. MRN: [id number]  Chief Complaint  Patient presents with   Hypertension    HPI  Hypertension- Pt denies chest pain, SOB, dizziness, or heart palpitations.  Taking meds as directed w/o problems.  Denies medication side effects.    Impaired fasting glucose-no increased thirst or urination. No symptoms consistent with hypoglycemia.  F/U CKD 3  - no changes.     ROS    Objective:     BP 135/79   Pulse 98   Ht 5\' 6"  (1.676 m)   Wt 205 lb (93 kg)   SpO2 99%   BMI 33.09 kg/m    Physical Exam Constitutional:      Appearance: He is well-developed.  HENT:     Head: Normocephalic and atraumatic.  Cardiovascular:     Rate and Rhythm: Normal rate and regular rhythm.     Heart sounds: Normal heart sounds.  Pulmonary:     Effort: Pulmonary effort is normal.     Breath sounds: Normal breath sounds.  Skin:    General: Skin is warm and dry.  Neurological:     Mental Status: He is alert and oriented to person, place, and time.  Psychiatric:        Behavior: Behavior normal.      Results for orders placed or performed in visit on 10/24/22  POCT glycosylated hemoglobin (Hb A1C)  Result Value Ref Range   Hemoglobin A1C 5.4 4.0 - 5.6 %   HbA1c POC (<> result, manual entry)     HbA1c, POC (prediabetic range)     HbA1c, POC (controlled diabetic range)        The 10-year ASCVD risk score (Arnett DK, et al., 2019) is: 22.3%    Assessment & Plan:   Problem List Items Addressed This Visit       Cardiovascular and Mediastinum   HYPERTENSION - Primary    Well controlled. Continue current regimen. Follow up in  6 mo       Relevant Orders   COMPLETE METABOLIC PANEL WITH GFR   Phosphorus   Urine Microalbumin w/creat. ratio     Endocrine   IFG (impaired fasting glucose)    A1C is phenomenal. Today.  Keep up the good work.  Doing water aerobic 2-3 times a  week.        Relevant Orders   COMPLETE METABOLIC PANEL WITH GFR   Phosphorus   Urine Microalbumin w/creat. ratio   POCT glycosylated hemoglobin (Hb A1C) (Completed)     Genitourinary   CKD stage G3a/A1, GFR 45-59 and albumin creatinine ratio <30 mg/g (HCC)    Due for updated labs.       Relevant Orders   COMPLETE METABOLIC PANEL WITH GFR   Phosphorus   Urine Microalbumin w/creat. ratio     Other   Slurred speech    He did complete speech therapy and is definitely enunciating his words better.       Return in about 6 months (around 04/26/2023) for Hypertension, Pre-diabetes.    Beatrice Lecher, MD

## 2022-10-24 NOTE — Assessment & Plan Note (Signed)
Due for updated labs

## 2022-10-24 NOTE — Assessment & Plan Note (Signed)
A1C is phenomenal. Today.  Keep up the good work.  Doing water aerobic 2-3 times a week.

## 2022-10-24 NOTE — Assessment & Plan Note (Signed)
Well controlled. Continue current regimen. Follow up in  6 mo  

## 2022-10-25 LAB — COMPLETE METABOLIC PANEL WITH GFR
AG Ratio: 1.6 (calc) (ref 1.0–2.5)
ALT: 10 U/L (ref 9–46)
AST: 15 U/L (ref 10–35)
Albumin: 4.7 g/dL (ref 3.6–5.1)
Alkaline phosphatase (APISO): 85 U/L (ref 35–144)
BUN/Creatinine Ratio: 10 (calc) (ref 6–22)
BUN: 14 mg/dL (ref 7–25)
CO2: 25 mmol/L (ref 20–32)
Calcium: 10.1 mg/dL (ref 8.6–10.3)
Chloride: 105 mmol/L (ref 98–110)
Creat: 1.36 mg/dL — ABNORMAL HIGH (ref 0.70–1.28)
Globulin: 3 g/dL (calc) (ref 1.9–3.7)
Glucose, Bld: 94 mg/dL (ref 65–99)
Potassium: 4.5 mmol/L (ref 3.5–5.3)
Sodium: 142 mmol/L (ref 135–146)
Total Bilirubin: 0.6 mg/dL (ref 0.2–1.2)
Total Protein: 7.7 g/dL (ref 6.1–8.1)
eGFR: 55 mL/min/{1.73_m2} — ABNORMAL LOW (ref >=60)

## 2022-10-25 LAB — PHOSPHORUS: Phosphorus: 3.5 mg/dL (ref 2.1–4.3)

## 2022-10-25 LAB — MICROALBUMIN / CREATININE URINE RATIO
Creatinine, Urine: 126 mg/dL (ref 20–320)
Microalb Creat Ratio: 33 mg/g creat — ABNORMAL HIGH (ref <30)
Microalb, Ur: 4.2 mg/dL

## 2022-10-28 NOTE — Progress Notes (Signed)
Hi Cleo, kidney function is stable.  Was just a little bit extra protein in the urine just past the normal range so organ to keep an eye on this and plan to recheck it again in 6 months.  Normal phosphorus levels.

## 2023-01-23 ENCOUNTER — Other Ambulatory Visit: Payer: Self-pay | Admitting: Family Medicine

## 2023-01-23 DIAGNOSIS — I10 Essential (primary) hypertension: Secondary | ICD-10-CM

## 2023-03-09 ENCOUNTER — Other Ambulatory Visit: Payer: Self-pay | Admitting: Family Medicine

## 2023-03-09 DIAGNOSIS — E785 Hyperlipidemia, unspecified: Secondary | ICD-10-CM

## 2023-03-18 ENCOUNTER — Other Ambulatory Visit: Payer: Self-pay | Admitting: Family Medicine

## 2023-03-18 DIAGNOSIS — E785 Hyperlipidemia, unspecified: Secondary | ICD-10-CM

## 2023-04-24 ENCOUNTER — Other Ambulatory Visit: Payer: Self-pay | Admitting: Family Medicine

## 2023-04-24 DIAGNOSIS — I10 Essential (primary) hypertension: Secondary | ICD-10-CM

## 2023-04-28 ENCOUNTER — Ambulatory Visit (INDEPENDENT_AMBULATORY_CARE_PROVIDER_SITE_OTHER): Payer: 59 | Admitting: Family Medicine

## 2023-04-28 ENCOUNTER — Encounter: Payer: Self-pay | Admitting: Family Medicine

## 2023-04-28 VITALS — BP 125/76 | HR 93 | Ht 66.0 in | Wt 203.0 lb

## 2023-04-28 DIAGNOSIS — E785 Hyperlipidemia, unspecified: Secondary | ICD-10-CM

## 2023-04-28 DIAGNOSIS — N1831 Chronic kidney disease, stage 3a: Secondary | ICD-10-CM | POA: Diagnosis not present

## 2023-04-28 DIAGNOSIS — R7301 Impaired fasting glucose: Secondary | ICD-10-CM

## 2023-04-28 DIAGNOSIS — Z125 Encounter for screening for malignant neoplasm of prostate: Secondary | ICD-10-CM

## 2023-04-28 DIAGNOSIS — F4321 Adjustment disorder with depressed mood: Secondary | ICD-10-CM

## 2023-04-28 DIAGNOSIS — K219 Gastro-esophageal reflux disease without esophagitis: Secondary | ICD-10-CM

## 2023-04-28 DIAGNOSIS — I10 Essential (primary) hypertension: Secondary | ICD-10-CM

## 2023-04-28 LAB — POCT GLYCOSYLATED HEMOGLOBIN (HGB A1C): Hemoglobin A1C: 5.5 % (ref 4.0–5.6)

## 2023-04-28 MED ORDER — OMEPRAZOLE 40 MG PO CPDR
40.0000 mg | DELAYED_RELEASE_CAPSULE | Freq: Every day | ORAL | 3 refills | Status: DC
Start: 2023-04-28 — End: 2024-04-19

## 2023-04-28 NOTE — Assessment & Plan Note (Signed)
Well controlled. Continue current regimen. Follow up in  6 mo  

## 2023-04-28 NOTE — Assessment & Plan Note (Signed)
Tolerating statin well. Continue current regimen. Due to recheck lipids.

## 2023-04-28 NOTE — Progress Notes (Signed)
Established Patient Office Visit  Subjective   Patient ID: CARLY MILOS, male    DOB: 11/22/1950  Age: 72 y.o. MRN: [id number]  Chief Complaint  Patient presents with   Hypertension   ifg    HPI Hypertension- Pt denies chest pain, SOB, dizziness, or heart palpitations.  Taking meds as directed w/o problems.  Denies medication side effects.    Impaired fasting glucose-no increased thirst or urination. No symptoms consistent with hypoglycemia.    ROS    Objective:     BP 125/76   Pulse 93   Ht 5\' 6"  (1.676 m)   Wt 203 lb (92.1 kg)   SpO2 98%   BMI 32.77 kg/m    Physical Exam Vitals and nursing note reviewed.  Constitutional:      Appearance: Normal appearance.  HENT:     Head: Normocephalic and atraumatic.  Eyes:     Conjunctiva/sclera: Conjunctivae normal.  Cardiovascular:     Rate and Rhythm: Normal rate and regular rhythm.  Pulmonary:     Effort: Pulmonary effort is normal.     Breath sounds: Normal breath sounds.  Skin:    General: Skin is warm and dry.  Neurological:     Mental Status: He is alert.  Psychiatric:        Mood and Affect: Mood normal.      Results for orders placed or performed in visit on 04/28/23  POCT HgB A1C  Result Value Ref Range   Hemoglobin A1C 5.5 4.0 - 5.6 %   HbA1c POC (<> result, manual entry)     HbA1c, POC (prediabetic range)     HbA1c, POC (controlled diabetic range)        The 10-year ASCVD risk score (Arnett DK, et al., 2019) is: 20.1%    Assessment & Plan:   Problem List Items Addressed This Visit       Cardiovascular and Mediastinum   HYPERTENSION    Well controlled. Continue current regimen. Follow up in  32mo       Relevant Orders   CMP14+EGFR   Lipid panel   CBC   PSA     Endocrine   IFG (impaired fasting glucose) - Primary    Well controlled. Continue current regimen. Follow up in  32mo       Relevant Orders   POCT HgB A1C (Completed)   CMP14+EGFR   Lipid panel   CBC   PSA      Genitourinary   CKD stage G3a/A1, GFR 45-59 and albumin creatinine ratio <30 mg/g (HCC)    Continue to work on healthy diet and exercise.       Relevant Orders   CMP14+EGFR   Lipid panel   CBC   PSA     Other   Hyperlipidemia    Tolerating statin well. Continue current regimen. Due to recheck lipids.        Other Visit Diagnoses     Screening for malignant neoplasm of prostate       Relevant Orders   PSA   Gastroesophageal reflux disease       Relevant Medications   omeprazole (PRILOSEC) 40 MG capsule   Grief       Relevant Orders   Ambulatory referral to Behavioral Health      Grief - he feels like he hasn't really been able to grieve his wife's death. He had to push through for his daughters. He would like to see a Veterinary surgeon.  Return in about 6 months (around 10/26/2023) for bp/ifg.    Nani Gasser, MD

## 2023-04-28 NOTE — Assessment & Plan Note (Signed)
Continue to work on healthy diet and exercise.   

## 2023-04-29 ENCOUNTER — Other Ambulatory Visit: Payer: Self-pay | Admitting: Family Medicine

## 2023-04-29 DIAGNOSIS — E785 Hyperlipidemia, unspecified: Secondary | ICD-10-CM

## 2023-04-29 LAB — LIPID PANEL
Chol/HDL Ratio: 3.7 {ratio} (ref 0.0–5.0)
Cholesterol, Total: 154 mg/dL (ref 100–199)
HDL: 42 mg/dL (ref >39)
LDL Chol Calc (NIH): 93 mg/dL (ref 0–99)
Triglycerides: 106 mg/dL (ref 0–149)
VLDL Cholesterol Cal: 19 mg/dL (ref 5–40)

## 2023-04-29 LAB — CMP14+EGFR
ALT: 14 [IU]/L (ref 0–44)
AST: 20 [IU]/L (ref 0–40)
Albumin: 4.6 g/dL (ref 3.8–4.8)
Alkaline Phosphatase: 91 [IU]/L (ref 44–121)
BUN/Creatinine Ratio: 12 (ref 10–24)
BUN: 16 mg/dL (ref 8–27)
Bilirubin Total: 0.4 mg/dL (ref 0.0–1.2)
CO2: 21 mmol/L (ref 20–29)
Calcium: 10 mg/dL (ref 8.6–10.2)
Chloride: 106 mmol/L (ref 96–106)
Creatinine, Ser: 1.35 mg/dL — ABNORMAL HIGH (ref 0.76–1.27)
Globulin, Total: 2.8 g/dL (ref 1.5–4.5)
Glucose: 98 mg/dL (ref 70–99)
Potassium: 4.4 mmol/L (ref 3.5–5.2)
Sodium: 141 mmol/L (ref 134–144)
Total Protein: 7.4 g/dL (ref 6.0–8.5)
eGFR: 55 mL/min/{1.73_m2} — ABNORMAL LOW (ref >59)

## 2023-04-29 LAB — PSA: Prostate Specific Ag, Serum: 2.3 ng/mL (ref 0.0–4.0)

## 2023-04-29 LAB — CBC
Hematocrit: 45.4 % (ref 37.5–51.0)
Hemoglobin: 15.3 g/dL (ref 13.0–17.7)
MCH: 29.5 pg (ref 26.6–33.0)
MCHC: 33.7 g/dL (ref 31.5–35.7)
MCV: 88 fL (ref 79–97)
Platelets: 354 10*3/uL (ref 150–450)
RBC: 5.19 x10E6/uL (ref 4.14–5.80)
RDW: 12.9 % (ref 11.6–15.4)
WBC: 11.2 10*3/uL — ABNORMAL HIGH (ref 3.4–10.8)

## 2023-04-29 MED ORDER — ATORVASTATIN CALCIUM 20 MG PO TABS: 20.0000 mg | ORAL_TABLET | Freq: Every day | ORAL | 3 refills | Status: DC

## 2023-04-29 NOTE — Progress Notes (Signed)
Kidneys are stable. Normal prostate test. Cholesterol is OK Make sure taking the lipitor every night.

## 2023-05-06 ENCOUNTER — Ambulatory Visit (INDEPENDENT_AMBULATORY_CARE_PROVIDER_SITE_OTHER): Payer: 59 | Admitting: Family Medicine

## 2023-05-06 DIAGNOSIS — Z0000 Encounter for general adult medical examination without abnormal findings: Secondary | ICD-10-CM | POA: Diagnosis not present

## 2023-05-06 NOTE — Patient Instructions (Addendum)
MEDICARE ANNUAL WELLNESS VISIT Health Maintenance Summary and Written Plan of Care  Mr. Jose Waller ,  Thank you for allowing me to perform your Medicare Annual Wellness Visit and for your ongoing commitment to your health.   Health Maintenance & Immunization History Health Maintenance  Topic Date Due  . DTaP/Tdap/Td (2 - Tdap) 05/02/2018  . Zoster Vaccines- Shingrix (1 of 2) 07/26/2023 (Originally 12/14/1967)  . Pneumonia Vaccine 70+ Years old (1 of 1 - PCV) 10/24/2023 (Originally 12/13/2013)  . INFLUENZA VACCINE  10/27/2023 (Originally 02/27/2023)  . COVID-19 Vaccine (4 - 2023-24 season) 05/13/2024 (Originally 03/30/2023)  . Medicare Annual Wellness (AWV)  05/05/2024  . Colonoscopy  08/29/2024  . Hepatitis C Screening  Completed  . HPV VACCINES  Aged Out   Immunization History  Administered Date(s) Administered  . Moderna Sars-Covid-2 Vaccination 02/26/2020, 03/24/2020, 09/13/2020  . Td 05/02/2008  . Zoster, Live 02/12/2011    These are the patient goals that we discussed:  Goals Addressed              This Visit's Progress   .  Patient Stated (pt-stated)        Patient stated that he would like to start exercising after he gets his knees looked at.        This is a list of Health Maintenance Items that are overdue or due now: Pneumococcal vaccine  Influenza vaccine Td vaccine Shingles vaccine  Patient declined all of the vaccines.   Orders/Referrals Placed Today: No orders of the defined types were placed in this encounter.  (Contact our referral department at [phone] if you have not spoken with someone about your referral appointment within the next 5 days)    Follow-up Plan Follow-up with Agapito Games, MD as planned Medicare wellness visit in one year.  AVS printed and mailed to the patient.       Health Maintenance, Male Adopting a healthy lifestyle and getting preventive care are important in promoting health and wellness. Ask your health  care provider about: The right schedule for you to have regular tests and exams. Things you can do on your own to prevent diseases and keep yourself healthy. What should I know about diet, weight, and exercise? Eat a healthy diet  Eat a diet that includes plenty of vegetables, fruits, low-fat dairy products, and lean protein. Do not eat a lot of foods that are high in solid fats, added sugars, or sodium. Maintain a healthy weight Body mass index (BMI) is a measurement that can be used to identify possible weight problems. It estimates body fat based on height and weight. Your health care provider can help determine your BMI and help you achieve or maintain a healthy weight. Get regular exercise Get regular exercise. This is one of the most important things you can do for your health. Most adults should: Exercise for at least 150 minutes each week. The exercise should increase your heart rate and make you sweat (moderate-intensity exercise). Do strengthening exercises at least twice a week. This is in addition to the moderate-intensity exercise. Spend less time sitting. Even light physical activity can be beneficial. Watch cholesterol and blood lipids Have your blood tested for lipids and cholesterol at 74 years of age, then have this test every 5 years. You may need to have your cholesterol levels checked more often if: Your lipid or cholesterol levels are high. You are older than 74 years of age. You are at high risk for heart disease. What should I  know about cancer screening? Many types of cancers can be detected early and may often be prevented. Depending on your health history and family history, you may need to have cancer screening at various ages. This may include screening for: Colorectal cancer. Prostate cancer. Skin cancer. Lung cancer. What should I know about heart disease, diabetes, and high blood pressure? Blood pressure and heart disease High blood pressure causes heart  disease and increases the risk of stroke. This is more likely to develop in people who have high blood pressure readings or are overweight. Talk with your health care provider about your target blood pressure readings. Have your blood pressure checked: Every 3-5 years if you are 38-63 years of age. Every year if you are 46 years old or older. If you are between the ages of 80 and 21 and are a current or former smoker, ask your health care provider if you should have a one-time screening for abdominal aortic aneurysm (AAA). Diabetes Have regular diabetes screenings. This checks your fasting blood sugar level. Have the screening done: Once every three years after age 2 if you are at a normal weight and have a low risk for diabetes. More often and at a younger age if you are overweight or have a high risk for diabetes. What should I know about preventing infection? Hepatitis B If you have a higher risk for hepatitis B, you should be screened for this virus. Talk with your health care provider to find out if you are at risk for hepatitis B infection. Hepatitis C Blood testing is recommended for: Everyone born from [birth year] through 1967. Anyone with known risk factors for hepatitis C. Sexually transmitted infections (STIs) You should be screened each year for STIs, including gonorrhea and chlamydia, if: You are sexually active and are younger than 74 years of age. You are older than 73 years of age and your health care provider tells you that you are at risk for this type of infection. Your sexual activity has changed since you were last screened, and you are at increased risk for chlamydia or gonorrhea. Ask your health care provider if you are at risk. Ask your health care provider about whether you are at high risk for HIV. Your health care provider may recommend a prescription medicine to help prevent HIV infection. If you choose to take medicine to prevent HIV, you should first get tested for HIV.  You should then be tested every 3 months for as long as you are taking the medicine. Follow these instructions at home: Alcohol use Do not drink alcohol if your health care provider tells you not to drink. If you drink alcohol: Limit how much you have to 0-2 drinks a day. Know how much alcohol is in your drink. In the [place] one drink equals one 12 oz bottle of beer (355 mL), one 5 oz glass of wine (148 mL), or one 1 oz glass of hard liquor (44 mL). Lifestyle Do not use any products that contain nicotine or tobacco. These products include cigarettes, chewing tobacco, and vaping devices, such as e-cigarettes. If you need help quitting, ask your health care provider. Do not use street drugs. Do not share needles. Ask your health care provider for help if you need support or information about quitting drugs. General instructions Schedule regular health, dental, and eye exams. Stay current with your vaccines. Tell your health care provider if: You often feel depressed. You have ever been abused or do not feel safe at home.  Summary Adopting a healthy lifestyle and getting preventive care are important in promoting health and wellness. Follow your health care provider's instructions about healthy diet, exercising, and getting tested or screened for diseases. Follow your health care provider's instructions on monitoring your cholesterol and blood pressure. This information is not intended to replace advice given to you by your health care provider. Make sure you discuss any questions you have with your health care provider. Document Revised: 12/04/2020 Document Reviewed: 12/04/2020 Elsevier Patient Education  2024 [employer].

## 2023-05-06 NOTE — Progress Notes (Signed)
MEDICARE ANNUAL WELLNESS VISIT  05/06/2023  Telephone Visit Disclaimer This Medicare AWV was conducted by telephone due to national recommendations for restrictions regarding the COVID-19 Pandemic (e.g. social distancing).  I verified, using two identifiers, that I am speaking with Jose Waller or their authorized healthcare agent. I discussed the limitations, risks, security, and privacy concerns of performing an evaluation and management service by telephone and the potential availability of an in-person appointment in the future. The patient expressed understanding and agreed to proceed.  Location of Patient: Home Location of Provider (nurse):  In the office.  Subjective:    Jose Waller is a 72 y.o. male patient of Metheney, Barbarann Ehlers, MD who had a Medicare Annual Wellness Visit today via telephone. Jose Waller is Retired and lives alone. he has 6 children. he reports that he is socially active and does interact with friends/family regularly. he is moderately physically active and enjoys enjoys going to flea market.  Patient Care Team: Agapito Games, MD as PCP - General     05/06/2023    8:05 AM 05/01/2022    8:08 AM 04/27/2021    8:13 AM 01/11/2021    9:33 AM 08/24/2018    2:10 PM 04/19/2015    8:28 AM  Advanced Directives  Does Patient Have a Medical Advance Directive? No No No No No No  Would patient like information on creating a medical advance directive? No - Patient declined No - Patient declined No - Patient declined No - Patient declined Yes (MAU/Ambulatory/Procedural Areas - Information given) Yes - Educational materials given    Hospital Utilization Over the Past 12 Months: # of hospitalizations or ER visits: 0 # of surgeries: 0  Review of Systems    Patient reports that his overall health is better compared to last year.  History obtained from chart review and the patient  Patient Reported Readings (BP, Pulse, CBG, Weight, etc) none Per patient no change  in vitals since last visit, unable to obtain new vitals due to telehealth visit  Pain Assessment Pain : No/denies pain     Current Medications & Allergies (verified) Allergies as of 05/06/2023       Reactions   Prednisone    REACTION: hick ups        Medication List        Accurate as of May 06, 2023  8:16 AM. If you have any questions, ask your nurse or doctor.          amLODipine 10 MG tablet Commonly known as: NORVASC Take 1 tablet (10 mg total) by mouth daily.   atorvastatin 20 MG tablet Commonly known as: LIPITOR Take 1 tablet (20 mg total) by mouth at bedtime.   celecoxib 200 MG capsule Commonly known as: CELEBREX Take 1 capsule (200 mg total) by mouth 2 (two) times daily.   Gemtesa 75 MG Tabs Generic drug: Vibegron Take 1 tablet by mouth daily.   omeprazole 40 MG capsule Commonly known as: PRILOSEC Take 1 capsule (40 mg total) by mouth daily.   sildenafil 20 MG tablet Commonly known as: REVATIO Take 1-5 tablets (20-100 mg total) by mouth as needed.   valsartan 320 MG tablet Commonly known as: DIOVAN Take 1 tablet by mouth once daily        History (reviewed): Past Medical History:  Diagnosis Date   Allergy    Arthritis    Cataract    MD just watching   CKD (chronic kidney disease) stage 3,  GFR 30-59 ml/min (HCC) 06/05/2018   Cluster headache    resolved   Glaucoma    Hypertension    Substance abuse (HCC)    cocaine - quit 1998   Past Surgical History:  Procedure Laterality Date   back lumbar  2004   fusion    COLONOSCOPY  06/09/2009   Dr Mellody Memos - normal   WISDOM TOOTH EXTRACTION     Family History  Problem Relation Age of Onset   Lung cancer Father        lung/ was a miner   Alcohol abuse Other    Hypertension Brother    Dementia Mother    Coronary artery disease Brother    Colon cancer Neg Hx    Stomach cancer Neg Hx    Rectal cancer Neg Hx    Social History   Socioeconomic History   Marital status: Widowed     Spouse name: Not on file   Number of children: 6   Years of education: 20   Highest education level: 12th grade  Occupational History   Occupation: retired    Comment: Technical sales engineer  Tobacco Use   Smoking status: Former    Current packs/day: 0.00    Types: Cigarettes    Quit date: 07/29/2001    Years since quitting: 21.7   Smokeless tobacco: Never  Vaping Use   Vaping status: Never Used  Substance and Sexual Activity   Alcohol use: No   Drug use: Not Currently    Comment: Former cocaine user, quit 1998   Sexual activity: Yes  Other Topics Concern   Not on file  Social History Narrative   On disability. Wife passed away in a motor vehicle accident. He has six children. He enjoys going to the flea market.   Social Determinants of Health   Financial Resource Strain: Low Risk  (05/06/2023)   Overall Financial Resource Strain (CARDIA)    Difficulty of Paying Living Expenses: Not hard at all  Food Insecurity: No Food Insecurity (05/06/2023)   Hunger Vital Sign    Worried About Running Out of Food in the Last Year: Never true    Ran Out of Food in the Last Year: Never true  Transportation Needs: No Transportation Needs (05/06/2023)   PRAPARE - Administrator, Civil Service (Medical): No    Lack of Transportation (Non-Medical): No  Physical Activity: Inactive (05/06/2023)   Exercise Vital Sign    Days of Exercise per Week: 0 days    Minutes of Exercise per Session: 0 min  Stress: No Stress Concern Present (05/06/2023)   [employer] of Occupational Health - Occupational Stress Questionnaire    Feeling of Stress : Not at all  Social Connections: Moderately Integrated (05/06/2023)   Social Connection and Isolation Panel [NHANES]    Frequency of Communication with Friends and Family: More than three times a week    Frequency of Social Gatherings with Friends and Family: Once a week    Attends Religious Services: More than 4 times per year    Active Member  of [employer] or Organizations: Yes    Attends Banker Meetings: Never    Marital Status: Widowed    Activities of Daily Living    05/06/2023    8:08 AM  In your present state of health, do you have any difficulty performing the following activities:  Hearing? 0  Vision? 0  Difficulty concentrating or making decisions? 0  Walking or climbing stairs?  1  Comment knee pain  Dressing or bathing? 0  Doing errands, shopping? 0  Preparing Food and eating ? N  Using the Toilet? N  In the past six months, have you accidently leaked urine? N  Do you have problems with loss of bowel control? N  Managing your Medications? N  Managing your Finances? N  Housekeeping or managing your Housekeeping? N    Patient Education/ Literacy How often do you need to have someone help you when you read instructions, pamphlets, or other written materials from your doctor or pharmacy?: 1 - Never What is the last grade level you completed in school?: 12th grade  Exercise    Diet Patient reports consuming  2-3  meals a day and 3-4 snack(s) a day Patient reports that his primary diet is: Regular Patient reports that she does have regular access to food.   Depression Screen    05/06/2023    8:07 AM 04/28/2023    8:02 AM 10/24/2022    8:29 AM 05/01/2022    8:09 AM 04/25/2022    8:32 AM 10/22/2021    7:29 AM 04/27/2021    8:13 AM  PHQ 2/9 Scores  PHQ - 2 Score 0 0 0 0 0 0 0     Fall Risk    05/06/2023    8:07 AM 04/28/2023    8:02 AM 10/24/2022    8:29 AM 05/01/2022    8:09 AM 04/25/2022    8:32 AM  Fall Risk   Falls in the past year? 0 0 0 0 0  Number falls in past yr: 0 0 0 0 0  Injury with Fall? 0 0 0 0 0  Risk for fall due to : No Fall Risks No Fall Risks No Fall Risks No Fall Risks No Fall Risks  Follow up Falls evaluation completed Falls evaluation completed Falls evaluation completed Falls evaluation completed Falls evaluation completed     Objective:  DUSTYN DANSEREAU seemed alert  and oriented and he participated appropriately during our telephone visit.  Blood Pressure Weight BMI  BP Readings from Last 3 Encounters:  04/28/23 125/76  10/24/22 135/79  05/30/22 124/69   Wt Readings from Last 3 Encounters:  04/28/23 203 lb (92.1 kg)  10/24/22 205 lb (93 kg)  06/10/22 203 lb (92.1 kg)   BMI Readings from Last 1 Encounters:  04/28/23 32.77 kg/m    *Unable to obtain current vital signs, weight, and BMI due to telephone visit type  Hearing/Vision  Tee did not seem to have difficulty with hearing/understanding during the telephone conversation Reports that he has not sure if he had a formal eye exam by an eye care professional within the past year Reports that he has not had a formal hearing evaluation within the past year *Unable to fully assess hearing and vision during telephone visit type  Cognitive Function:    05/06/2023    8:11 AM 05/01/2022    8:12 AM 04/27/2021    8:17 AM 08/24/2018    2:16 PM  6CIT Screen  What Year? 0 points 0 points 4 points 0 points  What month? 0 points 0 points 0 points 0 points  What time? 0 points 0 points 0 points 0 points  Count back from 20 0 points 0 points 0 points 0 points  Months in reverse 4 points 4 points 0 points 0 points  Repeat phrase 0 points 0 points 0 points 0 points  Total Score 4 points 4 points 4  points 0 points   (Normal:0-7, Significant for Dysfunction: >8)  Normal Cognitive Function Screening: Yes   Immunization & Health Maintenance Record Immunization History  Administered Date(s) Administered   Moderna Sars-Covid-2 Vaccination 02/26/2020, 03/24/2020, 09/13/2020   Td 05/02/2008   Zoster, Live 02/12/2011    Health Maintenance  Topic Date Due   DTaP/Tdap/Td (2 - Tdap) 05/02/2018   Zoster Vaccines- Shingrix (1 of 2) 07/26/2023 (Originally 12/14/1967)   Pneumonia Vaccine 41+ Years old (1 of 1 - PCV) 10/24/2023 (Originally 12/13/2013)   INFLUENZA VACCINE  10/27/2023 (Originally 02/27/2023)    COVID-19 Vaccine (4 - 2023-24 season) 05/13/2024 (Originally 03/30/2023)   Medicare Annual Wellness (AWV)  05/05/2024   Colonoscopy  08/29/2024   Hepatitis C Screening  Completed   HPV VACCINES  Aged Out       Assessment  This is a routine wellness examination for Jose Waller.  Health Maintenance: Due or Overdue Health Maintenance Due  Topic Date Due   DTaP/Tdap/Td (2 - Tdap) 05/02/2018    Jose Waller does not need a referral for Community Assistance: Care Management:   no Social Work:    no Prescription Assistance:  no Nutrition/Diabetes Education:  no   Plan:  Personalized Goals  Goals Addressed               This Visit's Progress     Patient Stated (pt-stated)        Patient stated that he would like to start exercising after he gets his knees looked at.       Personalized Health Maintenance & Screening Recommendations  Pneumococcal vaccine  Influenza vaccine Td vaccine Shingles vaccine  Patient declined all of the vaccines.   Lung Cancer Screening Recommended: no (Low Dose CT Chest recommended if Age 63-80 years, 20 pack-year currently smoking OR have quit w/in past 15 years) Hepatitis C Screening recommended: no HIV Screening recommended: no  Advanced Directives: Written information was not prepared per patient's request.  Referrals & Orders No orders of the defined types were placed in this encounter.   Follow-up Plan Follow-up with Agapito Games, MD as planned Medicare wellness visit in one year.  AVS printed and mailed to the patient.    I have personally reviewed and noted the following in the patient's chart:   Medical and social history Use of alcohol, tobacco or illicit drugs  Current medications and supplements Functional ability and status Nutritional status Physical activity Advanced directives List of other physicians Hospitalizations, surgeries, and ER visits in previous 12 months Vitals Screenings to include  cognitive, depression, and falls Referrals and appointments  In addition, I have reviewed and discussed with Jose Waller certain preventive protocols, quality metrics, and best practice recommendations. A written personalized care plan for preventive services as well as general preventive health recommendations is available and can be mailed to the patient at his request.      Modesto Charon, RN BSN  05/06/2023

## 2023-06-05 ENCOUNTER — Encounter: Payer: Self-pay | Admitting: Professional

## 2023-06-05 ENCOUNTER — Ambulatory Visit: Payer: 59 | Admitting: Professional

## 2023-06-05 DIAGNOSIS — F4320 Adjustment disorder, unspecified: Secondary | ICD-10-CM | POA: Diagnosis not present

## 2023-06-05 NOTE — Progress Notes (Signed)
° ° ° ° ° ° ° ° ° ° ° ° ° ° °  Araya Roel, LCMHC °
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
## 2023-06-12 ENCOUNTER — Ambulatory Visit (INDEPENDENT_AMBULATORY_CARE_PROVIDER_SITE_OTHER): Payer: 59 | Admitting: Professional

## 2023-06-12 ENCOUNTER — Encounter: Payer: Self-pay | Admitting: Sports Medicine

## 2023-06-12 ENCOUNTER — Other Ambulatory Visit (INDEPENDENT_AMBULATORY_CARE_PROVIDER_SITE_OTHER): Payer: 59

## 2023-06-12 ENCOUNTER — Ambulatory Visit: Payer: 59 | Admitting: Sports Medicine

## 2023-06-12 ENCOUNTER — Encounter: Payer: Self-pay | Admitting: Professional

## 2023-06-12 DIAGNOSIS — M170 Bilateral primary osteoarthritis of knee: Secondary | ICD-10-CM

## 2023-06-12 DIAGNOSIS — F4320 Adjustment disorder, unspecified: Secondary | ICD-10-CM

## 2023-06-12 NOTE — Progress Notes (Signed)
    Procedures performed today:    Procedure: Real-time Ultrasound Guided injection of the left knee Device: Samsung HS60  Verbal informed consent obtained.  Time-out conducted.  Noted no overlying erythema, induration, or other signs of local infection.  Skin prepped in a sterile fashion.  Local anesthesia: Topical Ethyl chloride.  With sterile technique and under real time ultrasound guidance: Trace effusion noted, 1 cc Kenalog 40, 2 cc lidocaine, 2 cc bupivacaine injected easily Completed without difficulty  Advised to call if fevers/chills, erythema, induration, drainage, or persistent bleeding.  Images permanently stored and available for review in PACS.  Impression: Technically successful ultrasound guided injection.  Independent interpretation of notes and tests performed by another provider:   None.  Brief History, Exam, Impression, and Recommendations:    Primary osteoarthritis of both knees This is a very pleasant 72 year old male returns, known bilateral knee osteoarthritis, he had a bilateral knee injection back in October 2023, he had a right knee injection back in March 2024. Having recurrence of pain left knee, right knee continues to do well. Repeat steroid injection into the left knee, we will see him back in about 6 weeks and get him approved for viscosupplementation if insufficient efficacy.    ____________________________________________ Ihor Austin. Benjamin Stain, M.D., ABFM., CAQSM., AME. Primary Care and Sports Medicine [facility]  Adjunct Professor of Family Medicine  [place] [employer] [facility] [employer]  Restaurant manager, fast food

## 2023-06-12 NOTE — Progress Notes (Signed)
° ° ° ° ° ° ° ° ° ° ° ° ° ° °  Araya Roel, LCMHC °

## 2023-06-12 NOTE — Assessment & Plan Note (Signed)
This is a very pleasant 72 year old male returns, known bilateral knee osteoarthritis, he had a bilateral knee injection back in October 2023, he had a right knee injection back in March 2024. Having recurrence of pain left knee, right knee continues to do well. Repeat steroid injection into the left knee, we will see him back in about 6 weeks and get him approved for viscosupplementation if insufficient efficacy.

## 2023-06-12 NOTE — Progress Notes (Signed)
[facility] Counselor/Therapist Progress Note  Patient ID: PAYCEN CASEBOLT, MRN: [id number],    Date: 06/12/2023  Time Spent: 16 minutes 204-200pm   Treatment Type: Individual Therapy  Risk Assessment: Danger to Self:  No Self-injurious Behavior: No Danger to Others: No  Subjective: The patient arrived on time for his in-person session.  The patient reports he is well and does not believe he needs therapy. He reports when trying to consider what he could get out of therapy he could not think of anything. He reports he feels he is dealing well with his wife's death, that he enjoys the freedom of being single, and that he loves to spend time with his daughters and granddaughter.  Patient admits he doesn't know of anything he wants to work on. Clinician shared with patient that if he did not wish to continue Clinician can see him as needed and this is what he desires.  Diagnosis:Adjustment disorder, unspecified type  Plan:  -after discussion with this patient it was agreed that there will be no further appointments at this time -pt was provided Clinician's contact information if he ever wanted to schedule a session.

## 2023-06-13 DIAGNOSIS — M170 Bilateral primary osteoarthritis of knee: Secondary | ICD-10-CM

## 2023-06-13 MED ORDER — TRIAMCINOLONE ACETONIDE 40 MG/ML IJ SUSP
40.0000 mg | Freq: Once | INTRAMUSCULAR | Status: AC
Start: 2023-06-13 — End: 2023-06-13
  Administered 2023-06-13: 40 mg via INTRAMUSCULAR

## 2023-06-13 NOTE — Addendum Note (Signed)
Addended by: Carren Rang A on: 06/13/2023 11:05 AM   Modules accepted: Orders

## 2023-06-20 ENCOUNTER — Other Ambulatory Visit: Payer: Self-pay | Admitting: Sports Medicine

## 2023-06-20 DIAGNOSIS — G8929 Other chronic pain: Secondary | ICD-10-CM

## 2023-07-04 ENCOUNTER — Ambulatory Visit: Payer: 59 | Admitting: Professional

## 2023-07-18 ENCOUNTER — Ambulatory Visit (INDEPENDENT_AMBULATORY_CARE_PROVIDER_SITE_OTHER): Payer: 59 | Admitting: Sports Medicine

## 2023-07-18 DIAGNOSIS — M170 Bilateral primary osteoarthritis of knee: Secondary | ICD-10-CM

## 2023-07-18 MED ORDER — NAPROXEN 500 MG PO TABS: 500.0000 mg | ORAL_TABLET | Freq: Two times a day (BID) | ORAL | 3 refills | Status: DC

## 2023-07-18 NOTE — Assessment & Plan Note (Signed)
Very pleasant 72 year old male, bilateral knee osteoarthritis, his last right knee injection was in March 2024, the right knee continues to do well, we did a left knee steroid injection last month, he is about 85% better and continues to improve. We did discuss some additional modalities including formal physical therapy, continuing arthritis Tylenol, Celebrex is not helping so we will switch him to naproxen. Also I will email him some home physical therapy, return to see me as needed.

## 2023-07-18 NOTE — Progress Notes (Signed)
    Procedures performed today:    None.  Independent interpretation of notes and tests performed by another provider:   None.  Brief History, Exam, Impression, and Recommendations:    Primary osteoarthritis of both knees Very pleasant 72 year old male, bilateral knee osteoarthritis, his last right knee injection was in March 2024, the right knee continues to do well, we did a left knee steroid injection last month, he is about 85% better and continues to improve. We did discuss some additional modalities including formal physical therapy, continuing arthritis Tylenol, Celebrex is not helping so we will switch him to naproxen. Also I will email him some home physical therapy, return to see me as needed.    ____________________________________________ Ihor Austin. Benjamin Stain, M.D., ABFM., CAQSM., AME. Primary Care and Sports Medicine [facility]  Adjunct Professor of Family Medicine  [place] of [facility] [employer]  Restaurant manager, fast food

## 2023-07-21 ENCOUNTER — Other Ambulatory Visit: Payer: Self-pay | Admitting: Family Medicine

## 2023-07-21 DIAGNOSIS — I10 Essential (primary) hypertension: Secondary | ICD-10-CM

## 2023-08-01 NOTE — Therapy (Signed)
 OUTPATIENT PHYSICAL THERAPY LOWER EXTREMITY EVALUATION   Patient Name: Jose Waller MRN: [id number] DOB:21-May-1951, 73 y.o., male Today's Date: 08/04/2023  END OF SESSION:  PT End of Session - 08/04/23 0714     Visit Number 1    Number of Visits 17    Date for PT Re-Evaluation 09/29/23    Authorization Type UHC    PT Start Time 0715    PT Stop Time 0801    PT Time Calculation (min) 46 min    Activity Tolerance Patient tolerated treatment well             Past Medical History:  Diagnosis Date   Allergy    Arthritis    Cataract    MD just watching   CKD (chronic kidney disease) stage 3, GFR 30-59 ml/min (HCC) 06/05/2018   Cluster headache    resolved   Glaucoma    Hypertension    Substance abuse (HCC)    cocaine - quit 1998   Past Surgical History:  Procedure Laterality Date   back lumbar  2004   fusion    COLONOSCOPY  06/09/2009   Dr Norleen Law - normal   WISDOM TOOTH EXTRACTION     Patient Active Problem List   Diagnosis Date Noted   Chronic pain of left knee 02/21/2022   Erectile dysfunction 04/23/2021   IFG (impaired fasting glucose) 10/23/2020   OAB (overactive bladder) 10/20/2020   Anorexia 02/09/2020   Combined forms of age-related cataract of left eye 09/23/2019   Primary osteoarthritis of both knees 04/22/2019   Slurred speech 01/28/2019   Urinary urgency 01/28/2019   CKD stage G3a/A1, GFR 45-59 and albumin creatinine ratio <30 mg/g (HCC) 06/05/2018   Migraine 09/08/2013   Low back pain 09/08/2013   Hyperlipidemia 12/31/2010   GERD 11/08/2009   HYPERTENSION 04/22/2008    PCP: Alvan Dorothyann BIRCH, MD  REFERRING PROVIDER: Curtis Debby PARAS, MD  REFERRING DIAG: M17.0 (ICD-10-CM) - Primary osteoarthritis of both knees  THERAPY DIAG:  Chronic pain of both knees  Other abnormalities of gait and mobility  Rationale for Evaluation and Treatment: Rehabilitation  ONSET DATE: about a year and a half  SUBJECTIVE:   SUBJECTIVE  STATEMENT: Pt reports moving into new apartment about a year and a half ago, subsequent increase in BIL knee pain. Denies any MOI or trauma. States he has had injections in both knees, R knee essentially resolved at this point, L knee improved but not quite to the same degree as R. Most difficulty with WB tasks. Pt notes symptoms improve significantly with brace use. He reports sometimes feeling burning in knees but thinks injections have helped with that. Pt denies any N/T, swelling, or fevers/night sweats.  Pt states he used to be fairly active, travelling to flea markets. Wants to try to get back into the gym, interested in water aerobics.    PERTINENT HISTORY: headaches, HTN, CKD  PAIN:  Are you having pain: none at rest Location/description: lateral L knee, aching and sharp at times Best-worst over past week: 0-8/10  - aggravating factors: standing 10-15 min, walking 10-15 - Easing factors: pain cream, injections, brace    PRECAUTIONS: None  WEIGHT BEARING RESTRICTIONS: No  FALLS:  Has patient fallen in last 6 months? No  LIVING ENVIRONMENT: 2 level apartment w/ elevator, lives in assisted living facility Pt takes care of housework  OCCUPATION: retired clinical research associate for [employer]  PLOF: Independent  PATIENT GOALS: getting back in gym, water aerobics, treadmill  NEXT MD VISIT: TBD per pt report   OBJECTIVE:  Note: Objective measures were completed at Evaluation unless otherwise noted.  DIAGNOSTIC FINDINGS:  No recent imaging in chart per EPIC review  PATIENT SURVEYS:  FOTO 60 > 62  COGNITION: Overall cognitive status: Within functional limits for tasks assessed     SENSATION: Light touch grossly symmetrical BIL LE  PALPATION: Concordant tenderness L lateral joint line, otherwise unremarkable overall.   LOWER EXTREMITY ROM:     Active  Right eval Left eval  Hip flexion    Hip extension    Hip internal rotation    Hip external rotation    Knee extension  A/P: 0 A: lacking 12 deg against gravity  P: full   Knee flexion A: 112  P: 120 A: 110 deg *  P: 120 deg *  (Blank rows = not tested) (Key: WFL = within functional limits not formally assessed, * = concordant pain, s = stiffness/stretching sensation, NT = not tested)  Comments:    LOWER EXTREMITY MMT:    MMT Right eval Left eval  Hip flexion 4 4  Hip abduction (modified sitting) 5 5  Hip internal rotation 4+ 4 *  Hip external rotation 4 + 4 *  Knee flexion 5 5  Knee extension 4+ 4+ (within available ROM)  Ankle dorsiflexion     (Blank rows = not tested) (Key: WFL = within functional limits not formally assessed, * = concordant pain, s = stiffness/stretching sensation, NT = not tested)  Comments:    LOWER EXTREMITY SPECIAL TESTS:  deferred  FUNCTIONAL TESTS:  5xSTS: 13.58sec w/o UE support   GAIT: Distance walked: within clinic Assistive device utilized: None Level of assistance: Complete Independence Comments: widened BOS, reduced L knee ext in stance, reduced hip ext BIL, increased lateral weightshifting BIL and intermittent steadying on nearby objects                                                                                                                                TREATMENT DATE:  [facility] Adult PT Treatment:                                                DATE: 08/04/23 Therapeutic Exercise: STS education/practice reps Adduction isometrics x10 cues/education for home setup/performance Standing march x5 BIL education/cues for home setup, UE support, pacing HEP handout + education, rationale for interventions, relevant anatomy/physiology     PATIENT EDUCATION:  Education details: Pt education on PT impairments, prognosis, and POC. Informed consent. Rationale for interventions, safe/appropriate HEP performance Person educated: Patient Education method: Explanation, Demonstration, Tactile cues, Verbal cues, and Handouts Education comprehension: verbalized  understanding, returned demonstration, verbal cues required, tactile cues required, and needs further education    HOME EXERCISE PROGRAM: Access Code: [email or web address] URL: [email or web address]  Date: 08/04/2023 Prepared by: Alm Jenny  Exercises - Sit to Stand with Armchair  - 2-3 x daily - 1 sets - 5 reps - Seated Hip Adduction Isometrics with Ball  - 2-3 x daily - 1 sets - 10 reps - Standing March with Counter Support  - 2-3 x daily - 1 sets - 5 reps  ASSESSMENT:  CLINICAL IMPRESSION: Patient is a very pleasant 73 y.o. gentleman who was seen today for physical therapy evaluation and treatment for bilateral knee pain, L>R. Symptoms ongoing for about a year and a half per pt and limiting tolerance to WB tasks such as walking and standing, although pt notes improvement since onset. On exam he demonstrates discrepancy between active and passive movement of both knees, reduced quad strength against gravity on L, and reduced rotational hip strength. 5xSTS around cutoff score for fall risk and reduced functional mobility (12-14sec varying by population). Pt tolerates exam/HEP well without increase in pain and no adverse events. Recommend trial of skilled PT to address aforementioned deficits with aim of improving functional tolerance and reducing pain with typical activities. Pt departs today's session in no acute distress, all voiced concerns/questions addressed appropriately from PT perspective.    OBJECTIVE IMPAIRMENTS: Abnormal gait, decreased activity tolerance, decreased balance, decreased endurance, decreased mobility, difficulty walking, decreased ROM, decreased strength, improper body mechanics, and pain.   ACTIVITY LIMITATIONS: carrying, lifting, standing, squatting, transfers, and locomotion level  PARTICIPATION LIMITATIONS: meal prep, cleaning, laundry, and community activity  PERSONAL FACTORS: Age, Time since onset of injury/illness/exacerbation, and 3+ comorbidities:  headaches, HTN, CKD  are also affecting patient's functional outcome.   REHAB POTENTIAL: Good  CLINICAL DECISION MAKING: Stable/uncomplicated  EVALUATION COMPLEXITY: Low   GOALS: Goals reviewed with patient? Yes  SHORT TERM GOALS: Target date: 09/01/2023 Pt will demonstrate appropriate understanding and performance of initially prescribed HEP in order to facilitate improved independence with management of symptoms.  Baseline: HEP provided on eval Goal status: INITIAL   2. Pt will report at least 25% decrease in overall pain levels in past week in order to facilitate improved tolerance to basic ADLs/mobility.   Baseline: 0-8/10  Goal status: INITIAL    LONG TERM GOALS: Target date: 09/29/2023 Pt will score 62 or greater on FOTO in order to demonstrate improved perception of function due to symptoms.  Baseline: 60 Goal status: INITIAL  2.  Pt will demonstrate at least 0-120 degrees of knee AROM in order to facilitate improved tolerance to functional movements such as walking/squatting.  Baseline: see ROM chart above Goal status: INITIAL  3.  Pt will report/demonstrate ability to stand/walk up to 30 min w/ less than 3 pt increase in resting pain in order to facilitate improved tolerance to daily tasks/mobility.  Baseline: difficulty tolerating >10-15 min standing/walking without increase in pain Goal status: INITIAL  4.  Pt will be able to perform 5xSTS in less than or equal to 10sec in order to demonstrate reduced fall risk and improved functional independence (MCID 5xSTS = 2.3 sec). Baseline: 13sec  Goal status: INITIAL   5. Pt will report at least 50% decrease in overall pain levels in past week in order to facilitate improved tolerance to basic ADLs/mobility.   Baseline: 0-8/10  Goal status: INITIAL     PLAN:  PT FREQUENCY: 2x/week  PT DURATION: 8 weeks  PLANNED INTERVENTIONS: 97164- PT Re-evaluation, 97110-Therapeutic exercises, 97530- Therapeutic activity, [id number]-  Neuromuscular re-education, 97535- Self Care, [id number]- Manual therapy, [phone]- Gait training, [phone]- Aquatic Therapy, Patient/Family education, Balance  training, Stair training, Taping, Dry Needling, Joint mobilization, Cryotherapy, and Moist heat  PLAN FOR NEXT SESSION: Review/update HEP PRN. Work on [employer] exercises as appropriate with emphasis on quad control/strength, hip rotational strength, WB tolerance and functional mobility. Symptom modification strategies as indicated/appropriate.    Alm DELENA Jenny PT, DPT 08/04/2023 8:40 AM

## 2023-08-04 ENCOUNTER — Encounter: Payer: Self-pay | Admitting: Physical Therapy

## 2023-08-04 ENCOUNTER — Ambulatory Visit: Payer: 59 | Attending: Sports Medicine | Admitting: Physical Therapy

## 2023-08-04 DIAGNOSIS — R2689 Other abnormalities of gait and mobility: Secondary | ICD-10-CM

## 2023-08-04 DIAGNOSIS — M170 Bilateral primary osteoarthritis of knee: Secondary | ICD-10-CM | POA: Diagnosis present

## 2023-08-04 DIAGNOSIS — G8929 Other chronic pain: Secondary | ICD-10-CM

## 2023-08-05 NOTE — Therapy (Signed)
 OUTPATIENT PHYSICAL THERAPY TREATMENT   Patient Name: Jose Waller MRN: [id number] DOB:Jan 05, 1951, 73 y.o., male Today's Date: 08/06/2023  END OF SESSION:  PT End of Session - 08/06/23 0713     Visit Number 2    Number of Visits 17    Date for PT Re-Evaluation 09/29/23    Authorization Type [employer]    PT Start Time 0715    PT Stop Time 0754    PT Time Calculation (min) 39 min    Activity Tolerance Patient tolerated treatment well              Past Medical History:  Diagnosis Date   Allergy    Arthritis    Cataract    MD just watching   CKD (chronic kidney disease) stage 3, GFR 30-59 ml/min (HCC) 06/05/2018   Cluster headache    resolved   Glaucoma    Hypertension    Substance abuse (HCC)    cocaine - quit 1998   Past Surgical History:  Procedure Laterality Date   back lumbar  2004   fusion    COLONOSCOPY  06/09/2009   Dr Norleen Law - normal   WISDOM TOOTH EXTRACTION     Patient Active Problem List   Diagnosis Date Noted   Chronic pain of left knee 02/21/2022   Erectile dysfunction 04/23/2021   IFG (impaired fasting glucose) 10/23/2020   OAB (overactive bladder) 10/20/2020   Anorexia 02/09/2020   Combined forms of age-related cataract of left eye 09/23/2019   Primary osteoarthritis of both knees 04/22/2019   Slurred speech 01/28/2019   Urinary urgency 01/28/2019   CKD stage G3a/A1, GFR 45-59 and albumin creatinine ratio <30 mg/g (HCC) 06/05/2018   Migraine 09/08/2013   Low back pain 09/08/2013   Hyperlipidemia 12/31/2010   GERD 11/08/2009   HYPERTENSION 04/22/2008    PCP: Alvan Dorothyann BIRCH, MD  REFERRING PROVIDER: Curtis Debby PARAS, MD  REFERRING DIAG: M17.0 (ICD-10-CM) - Primary osteoarthritis of both knees  THERAPY DIAG:  Chronic pain of both knees  Other abnormalities of gait and mobility  Rationale for Evaluation and Treatment: Rehabilitation  ONSET DATE: about a year and a half  SUBJECTIVE:  Per eval - Pt reports moving into  new apartment about a year and a half ago, subsequent increase in BIL knee pain. Denies any MOI or trauma. States he has had injections in both knees, R knee essentially resolved at this point, L knee improved but not quite to the same degree as R. Most difficulty with WB tasks. Pt notes symptoms improve significantly with brace use. He reports sometimes feeling burning in knees but thinks injections have helped with that. Pt denies any N/T, swelling, or fevers/night sweats.  Pt states he used to be fairly active, travelling to flea markets. Wants to try to get back into the gym, interested in water aerobics.   SUBJECTIVE STATEMENT: 08/06/2023 Did HEP yesterday, no issues but a bit more pain this morning. 8/10 L knee pain. No other new updates per pt report   PERTINENT HISTORY: headaches, HTN, CKD  PAIN:  Are you having pain: 8/10 L knee  Per eval -  Location/description: lateral L knee, aching and sharp at times Best-worst over past week: 0-8/10  - aggravating factors: standing 10-15 min, walking 10-15 - Easing factors: pain cream, injections, brace    PRECAUTIONS: None  WEIGHT BEARING RESTRICTIONS: No  FALLS:  Has patient fallen in last 6 months? No  LIVING ENVIRONMENT: 2 level apartment w/ elevator,  lives in assisted living facility Pt takes care of housework  OCCUPATION: retired clinical research associate for [employer]  PLOF: Independent  PATIENT GOALS: getting back in gym, water aerobics, treadmill  NEXT MD VISIT: TBD per pt report   OBJECTIVE:  Note: Objective measures were completed at Evaluation unless otherwise noted.  DIAGNOSTIC FINDINGS:  No recent imaging in chart per EPIC review  PATIENT SURVEYS:  FOTO 60 > 62  COGNITION: Overall cognitive status: Within functional limits for tasks assessed     SENSATION: Light touch grossly symmetrical BIL LE  PALPATION: Concordant tenderness L lateral joint line, otherwise unremarkable overall.   LOWER EXTREMITY ROM:     Active   Right eval Left eval  Hip flexion    Hip extension    Hip internal rotation    Hip external rotation    Knee extension A/P: 0 A: lacking 12 deg against gravity  P: full   Knee flexion A: 112  P: 120 A: 110 deg *  P: 120 deg *  (Blank rows = not tested) (Key: WFL = within functional limits not formally assessed, * = concordant pain, s = stiffness/stretching sensation, NT = not tested)  Comments:    LOWER EXTREMITY MMT:    MMT Right eval Left eval  Hip flexion 4 4  Hip abduction (modified sitting) 5 5  Hip internal rotation 4+ 4 *  Hip external rotation 4 + 4 *  Knee flexion 5 5  Knee extension 4+ 4+ (within available ROM)  Ankle dorsiflexion     (Blank rows = not tested) (Key: WFL = within functional limits not formally assessed, * = concordant pain, s = stiffness/stretching sensation, NT = not tested)  Comments:    LOWER EXTREMITY SPECIAL TESTS:  deferred  FUNCTIONAL TESTS:  5xSTS: 13.58sec w/o UE support   GAIT: Distance walked: within clinic Assistive device utilized: None Level of assistance: Complete Independence Comments: widened BOS, reduced L knee ext in stance, reduced hip ext BIL, increased lateral weightshifting BIL and intermittent steadying on nearby objects                                                                                                                                TREATMENT DATE:  [facility] Adult PT Treatment:                                                DATE: 08/06/23 Therapeutic Exercise: Adduction iso 2x12 March, standing 2x5 BIL cues for posture and breath control STS lowest mat 3x5 cues for pacing and trunk mechanics, BOS Hee/toe raises at counter 2x10  Seated TKE into bosy 2x8 BIL cues for breath control and quad contraction Seated quad set w/ heel prop 3x8 BIL cues for quad contraction and comfortable ROM, breath control 6 inch step weight  shift 2x8 BIL cues for quad/hamstring co contraction Verbal HEP  review/discussion    [facility] Adult PT Treatment:                                                DATE: 08/04/23 Therapeutic Exercise: STS education/practice reps Adduction isometrics x10 cues/education for home setup/performance Standing march x5 BIL education/cues for home setup, UE support, pacing HEP handout + education, rationale for interventions, relevant anatomy/physiology     PATIENT EDUCATION:  Education details: rationale for interventions, HEP  Person educated: Patient Education method: Explanation, Demonstration, Tactile cues, Verbal cues Education comprehension: verbalized understanding, returned demonstration, verbal cues required, tactile cues required, and needs further education     HOME EXERCISE PROGRAM: Access Code: [email or web address] URL: [email or web address] Date: 08/04/2023 Prepared by: Alm Jenny  Exercises - Sit to Stand with Armchair  - 2-3 x daily - 1 sets - 5 reps - Seated Hip Adduction Isometrics with Ball  - 2-3 x daily - 1 sets - 10 reps - Standing March with Counter Support  - 2-3 x daily - 1 sets - 5 reps  ASSESSMENT:  CLINICAL IMPRESSION: 08/06/2023 Pt arrives w/ increased pain this morning, 8/10 L knee. No issues after eval. Today pt does well with HEP review, expanding program to include hip and calf strengthening, more volume for closed chain work. Most difficulty with quad set + heel prop although pt reports improving tolerance with repetition. Tolerates well without adverse event, reports gradual reduction in pain as session goes on with report of 0/10 pain on departure. Recommend continuing along current POC in order to address relevant deficits and improve functional tolerance. Pt departs today's session in no acute distress, all voiced questions/concerns addressed appropriately from PT perspective.    Per eval - Patient is a very pleasant 73 y.o. gentleman who was seen today for physical therapy evaluation and treatment for bilateral knee  pain, L>R. Symptoms ongoing for about a year and a half per pt and limiting tolerance to WB tasks such as walking and standing, although pt notes improvement since onset. On exam he demonstrates discrepancy between active and passive movement of both knees, reduced quad strength against gravity on L, and reduced rotational hip strength. 5xSTS around cutoff score for fall risk and reduced functional mobility (12-14sec varying by population). Pt tolerates exam/HEP well without increase in pain and no adverse events. Recommend trial of skilled PT to address aforementioned deficits with aim of improving functional tolerance and reducing pain with typical activities. Pt departs today's session in no acute distress, all voiced concerns/questions addressed appropriately from PT perspective.    OBJECTIVE IMPAIRMENTS: Abnormal gait, decreased activity tolerance, decreased balance, decreased endurance, decreased mobility, difficulty walking, decreased ROM, decreased strength, improper body mechanics, and pain.   ACTIVITY LIMITATIONS: carrying, lifting, standing, squatting, transfers, and locomotion level  PARTICIPATION LIMITATIONS: meal prep, cleaning, laundry, and community activity  PERSONAL FACTORS: Age, Time since onset of injury/illness/exacerbation, and 3+ comorbidities: headaches, HTN, CKD  are also affecting patient's functional outcome.   REHAB POTENTIAL: Good  CLINICAL DECISION MAKING: Stable/uncomplicated  EVALUATION COMPLEXITY: Low   GOALS: Goals reviewed with patient? Yes  SHORT TERM GOALS: Target date: 09/01/2023 Pt will demonstrate appropriate understanding and performance of initially prescribed HEP in order to facilitate improved independence with management of symptoms.  Baseline: HEP provided on eval Goal status: INITIAL  2. Pt will report at least 25% decrease in overall pain levels in past week in order to facilitate improved tolerance to basic ADLs/mobility.   Baseline:  0-8/10  Goal status: INITIAL    LONG TERM GOALS: Target date: 09/29/2023 Pt will score 62 or greater on FOTO in order to demonstrate improved perception of function due to symptoms.  Baseline: 60 Goal status: INITIAL  2.  Pt will demonstrate at least 0-120 degrees of knee AROM in order to facilitate improved tolerance to functional movements such as walking/squatting.  Baseline: see ROM chart above Goal status: INITIAL  3.  Pt will report/demonstrate ability to stand/walk up to 30 min w/ less than 3 pt increase in resting pain in order to facilitate improved tolerance to daily tasks/mobility.  Baseline: difficulty tolerating >10-15 min standing/walking without increase in pain Goal status: INITIAL  4.  Pt will be able to perform 5xSTS in less than or equal to 10sec in order to demonstrate reduced fall risk and improved functional independence (MCID 5xSTS = 2.3 sec). Baseline: 13sec  Goal status: INITIAL   5. Pt will report at least 50% decrease in overall pain levels in past week in order to facilitate improved tolerance to basic ADLs/mobility.   Baseline: 0-8/10  Goal status: INITIAL     PLAN:  PT FREQUENCY: 2x/week  PT DURATION: 8 weeks  PLANNED INTERVENTIONS: 97164- PT Re-evaluation, 97110-Therapeutic exercises, 97530- Therapeutic activity, [id number]- Neuromuscular re-education, 97535- Self Care, [id number]- Manual therapy, [phone]- Gait training, [phone]- Aquatic Therapy, Patient/Family education, Balance training, Stair training, Taping, Dry Needling, Joint mobilization, Cryotherapy, and Moist heat  PLAN FOR NEXT SESSION: Review/update HEP PRN. Work on [employer] exercises as appropriate with emphasis on quad control/strength, hip rotational strength, WB tolerance and functional mobility. Symptom modification strategies as indicated/appropriate.    Alm DELENA Jenny PT, DPT 08/06/2023 7:58 AM

## 2023-08-06 ENCOUNTER — Encounter: Payer: Self-pay | Admitting: Physical Therapy

## 2023-08-06 ENCOUNTER — Ambulatory Visit: Payer: 59 | Admitting: Physical Therapy

## 2023-08-06 DIAGNOSIS — R2689 Other abnormalities of gait and mobility: Secondary | ICD-10-CM

## 2023-08-06 DIAGNOSIS — G8929 Other chronic pain: Secondary | ICD-10-CM

## 2023-08-06 DIAGNOSIS — M170 Bilateral primary osteoarthritis of knee: Secondary | ICD-10-CM | POA: Diagnosis not present

## 2023-08-11 ENCOUNTER — Ambulatory Visit: Payer: 59 | Admitting: Physical Therapy

## 2023-08-11 ENCOUNTER — Encounter: Payer: Self-pay | Admitting: Physical Therapy

## 2023-08-11 DIAGNOSIS — R2689 Other abnormalities of gait and mobility: Secondary | ICD-10-CM

## 2023-08-11 DIAGNOSIS — M170 Bilateral primary osteoarthritis of knee: Secondary | ICD-10-CM | POA: Diagnosis not present

## 2023-08-11 DIAGNOSIS — G8929 Other chronic pain: Secondary | ICD-10-CM

## 2023-08-11 NOTE — Therapy (Signed)
 OUTPATIENT PHYSICAL THERAPY TREATMENT   Patient Name: Jose Waller MRN: [id number] DOB:03-18-1951, 73 y.o., male Today's Date: 08/11/2023  END OF SESSION:  PT End of Session - 08/11/23 0712     Visit Number 3    Number of Visits 17    Date for PT Re-Evaluation 09/29/23    Authorization Type UHC    PT Start Time 0714    PT Stop Time 0753    PT Time Calculation (min) 39 min    Activity Tolerance Patient tolerated treatment well               Past Medical History:  Diagnosis Date   Allergy    Arthritis    Cataract    MD just watching   CKD (chronic kidney disease) stage 3, GFR 30-59 ml/min (HCC) 06/05/2018   Cluster headache    resolved   Glaucoma    Hypertension    Substance abuse (HCC)    cocaine - quit 1998   Past Surgical History:  Procedure Laterality Date   back lumbar  2004   fusion    COLONOSCOPY  06/09/2009   Dr Norleen Law - normal   WISDOM TOOTH EXTRACTION     Patient Active Problem List   Diagnosis Date Noted   Chronic pain of left knee 02/21/2022   Erectile dysfunction 04/23/2021   IFG (impaired fasting glucose) 10/23/2020   OAB (overactive bladder) 10/20/2020   Anorexia 02/09/2020   Combined forms of age-related cataract of left eye 09/23/2019   Primary osteoarthritis of both knees 04/22/2019   Slurred speech 01/28/2019   Urinary urgency 01/28/2019   CKD stage G3a/A1, GFR 45-59 and albumin creatinine ratio <30 mg/g (HCC) 06/05/2018   Migraine 09/08/2013   Low back pain 09/08/2013   Hyperlipidemia 12/31/2010   GERD 11/08/2009   HYPERTENSION 04/22/2008    PCP: Alvan Dorothyann BIRCH, MD  REFERRING PROVIDER: Curtis Debby PARAS, MD  REFERRING DIAG: M17.0 (ICD-10-CM) - Primary osteoarthritis of both knees  THERAPY DIAG:  Chronic pain of both knees  Other abnormalities of gait and mobility  Rationale for Evaluation and Treatment: Rehabilitation  ONSET DATE: about a year and a half  SUBJECTIVE:  Per eval - Pt reports moving into  new apartment about a year and a half ago, subsequent increase in BIL knee pain. Denies any MOI or trauma. States he has had injections in both knees, R knee essentially resolved at this point, L knee improved but not quite to the same degree as R. Most difficulty with WB tasks. Pt notes symptoms improve significantly with brace use. He reports sometimes feeling burning in knees but thinks injections have helped with that. Pt denies any N/T, swelling, or fevers/night sweats.  Pt states he used to be fairly active, travelling to flea markets. Wants to try to get back into the gym, interested in water aerobics.   SUBJECTIVE STATEMENT: 08/11/2023 Pt states he felt great after last session. Knee started to bother him a bit more last night, about 7/10. Went to a wedding this weekend.    PERTINENT HISTORY: headaches, HTN, CKD  PAIN:  Are you having pain: 7/10 L knee  Per eval -  Location/description: lateral L knee, aching and sharp at times Best-worst over past week: 0-8/10  - aggravating factors: standing 10-15 min, walking 10-15 - Easing factors: pain cream, injections, brace    PRECAUTIONS: None  WEIGHT BEARING RESTRICTIONS: No  FALLS:  Has patient fallen in last 6 months? No  LIVING ENVIRONMENT:  2 level apartment w/ elevator, lives in assisted living facility Pt takes care of housework  OCCUPATION: retired clinical research associate for [employer]  PLOF: Independent  PATIENT GOALS: getting back in gym, water aerobics, treadmill  NEXT MD VISIT: TBD per pt report   OBJECTIVE:  Note: Objective measures were completed at Evaluation unless otherwise noted.  DIAGNOSTIC FINDINGS:  No recent imaging in chart per EPIC review  PATIENT SURVEYS:  FOTO 60 > 62  COGNITION: Overall cognitive status: Within functional limits for tasks assessed     SENSATION: Light touch grossly symmetrical BIL LE  PALPATION: Concordant tenderness L lateral joint line, otherwise unremarkable overall.   LOWER  EXTREMITY ROM:     Active  Right eval Left eval L 08/11/23  Hip flexion     Hip extension     Hip internal rotation     Hip external rotation     Knee extension A/P: 0 A: lacking 12 deg against gravity  P: full  A: lacking 8 degrees against gravity   Knee flexion A: 112  P: 120 A: 110 deg *  P: 120 deg *   (Blank rows = not tested) (Key: WFL = within functional limits not formally assessed, * = concordant pain, s = stiffness/stretching sensation, NT = not tested)  Comments:    LOWER EXTREMITY MMT:    MMT Right eval Left eval  Hip flexion 4 4  Hip abduction (modified sitting) 5 5  Hip internal rotation 4+ 4 *  Hip external rotation 4 + 4 *  Knee flexion 5 5  Knee extension 4+ 4+ (within available ROM)  Ankle dorsiflexion     (Blank rows = not tested) (Key: WFL = within functional limits not formally assessed, * = concordant pain, s = stiffness/stretching sensation, NT = not tested)  Comments:    LOWER EXTREMITY SPECIAL TESTS:  deferred  FUNCTIONAL TESTS:  5xSTS: 13.58sec w/o UE support   GAIT: Distance walked: within clinic Assistive device utilized: None Level of assistance: Complete Independence Comments: widened BOS, reduced L knee ext in stance, reduced hip ext BIL, increased lateral weightshifting BIL and intermittent steadying on nearby objects                                                                                                                                TREATMENT DATE:  [facility] Adult PT Treatment:                                                DATE: 08/11/23 Therapeutic Exercise: LAQ 2x8 BIL LE cues for trunk mechanics and pacing  Seated bosu TKE x15 BIL LE cues for quad contraction 4 inch fwd step up w/ UE support, 2x8 BIL cues for full knee extension and posture at top of movement STS mat +  airex 2x8 cues for posture and trunk lean Standing heel/toe raises 2x15 w/ UE support  Mini lunge w/ UE support x10 cues for comfortable ROM  Propped  quad set x12 BIL HEP update + education/handout    [facility] Adult PT Treatment:                                                DATE: 08/06/23 Therapeutic Exercise: Adduction iso 2x12 March, standing 2x5 BIL cues for posture and breath control STS lowest mat 3x5 cues for pacing and trunk mechanics, BOS Hee/toe raises at counter 2x10  Seated TKE into bosy 2x8 BIL cues for breath control and quad contraction Seated quad set w/ heel prop 3x8 BIL cues for quad contraction and comfortable ROM, breath control 6 inch step weight shift 2x8 BIL cues for quad/hamstring co contraction Verbal HEP review/discussion    [facility] Adult PT Treatment:                                                DATE: 08/04/23 Therapeutic Exercise: STS education/practice reps Adduction isometrics x10 cues/education for home setup/performance Standing march x5 BIL education/cues for home setup, UE support, pacing HEP handout + education, rationale for interventions, relevant anatomy/physiology     PATIENT EDUCATION:  Education details: rationale for interventions, HEP  Person educated: Patient Education method: Explanation, Demonstration, Tactile cues, Verbal cues Education comprehension: verbalized understanding, returned demonstration, verbal cues required, tactile cues required, and needs further education     HOME EXERCISE PROGRAM: Access Code: [email or web address] URL: [email or web address] Date: 08/11/2023 Prepared by: Alm Jenny  Exercises - Sit to Stand with Armchair  - 2-3 x daily - 1 sets - 5 reps - Standing March with Counter Support  - 2-3 x daily - 1 sets - 5 reps - Seated Quad Set  - 2-3 x daily - 1 sets - 8 reps - Seated Long Arc Quad  - 2-3 x daily - 1 sets - 8 reps  ASSESSMENT:  CLINICAL IMPRESSION: 08/11/2023 Pt arrives w/ report of 7/10 pain, reports good relief for a couple days after last session. Continuing to work on quad activation in open and closed chain, hip/calf strengthening. Cues  as above, pt tolerates well with report of improving pain as session goes on. Departs with report of no pain, no adverse events. Recommend continuing along current POC in order to address relevant deficits and improve functional tolerance. Pt departs today's session in no acute distress, all voiced questions/concerns addressed appropriately from PT perspective.    Per eval - Patient is a very pleasant 73 y.o. gentleman who was seen today for physical therapy evaluation and treatment for bilateral knee pain, L>R. Symptoms ongoing for about a year and a half per pt and limiting tolerance to WB tasks such as walking and standing, although pt notes improvement since onset. On exam he demonstrates discrepancy between active and passive movement of both knees, reduced quad strength against gravity on L, and reduced rotational hip strength. 5xSTS around cutoff score for fall risk and reduced functional mobility (12-14sec varying by population). Pt tolerates exam/HEP well without increase in pain and no adverse events. Recommend trial of skilled PT to address aforementioned deficits with aim of improving functional tolerance  and reducing pain with typical activities. Pt departs today's session in no acute distress, all voiced concerns/questions addressed appropriately from PT perspective.    OBJECTIVE IMPAIRMENTS: Abnormal gait, decreased activity tolerance, decreased balance, decreased endurance, decreased mobility, difficulty walking, decreased ROM, decreased strength, improper body mechanics, and pain.   ACTIVITY LIMITATIONS: carrying, lifting, standing, squatting, transfers, and locomotion level  PARTICIPATION LIMITATIONS: meal prep, cleaning, laundry, and community activity  PERSONAL FACTORS: Age, Time since onset of injury/illness/exacerbation, and 3+ comorbidities: headaches, HTN, CKD  are also affecting patient's functional outcome.   REHAB POTENTIAL: Good  CLINICAL DECISION MAKING:  Stable/uncomplicated  EVALUATION COMPLEXITY: Low   GOALS: Goals reviewed with patient? Yes  SHORT TERM GOALS: Target date: 09/01/2023 Pt will demonstrate appropriate understanding and performance of initially prescribed HEP in order to facilitate improved independence with management of symptoms.  Baseline: HEP provided on eval Goal status: INITIAL   2. Pt will report at least 25% decrease in overall pain levels in past week in order to facilitate improved tolerance to basic ADLs/mobility.   Baseline: 0-8/10  Goal status: INITIAL    LONG TERM GOALS: Target date: 09/29/2023 Pt will score 62 or greater on FOTO in order to demonstrate improved perception of function due to symptoms.  Baseline: 60 Goal status: INITIAL  2.  Pt will demonstrate at least 0-120 degrees of knee AROM in order to facilitate improved tolerance to functional movements such as walking/squatting.  Baseline: see ROM chart above Goal status: INITIAL  3.  Pt will report/demonstrate ability to stand/walk up to 30 min w/ less than 3 pt increase in resting pain in order to facilitate improved tolerance to daily tasks/mobility.  Baseline: difficulty tolerating >10-15 min standing/walking without increase in pain Goal status: INITIAL  4.  Pt will be able to perform 5xSTS in less than or equal to 10sec in order to demonstrate reduced fall risk and improved functional independence (MCID 5xSTS = 2.3 sec). Baseline: 13sec  Goal status: INITIAL   5. Pt will report at least 50% decrease in overall pain levels in past week in order to facilitate improved tolerance to basic ADLs/mobility.   Baseline: 0-8/10  Goal status: INITIAL     PLAN:  PT FREQUENCY: 2x/week  PT DURATION: 8 weeks  PLANNED INTERVENTIONS: 97164- PT Re-evaluation, 97110-Therapeutic exercises, 97530- Therapeutic activity, [id number]- Neuromuscular re-education, 97535- Self Care, 02859- Manual therapy, [phone]- Gait training, [phone]- Aquatic Therapy, Patient/Family  education, Balance training, Stair training, Taping, Dry Needling, Joint mobilization, Cryotherapy, and Moist heat  PLAN FOR NEXT SESSION: Review/update HEP PRN. Work on [employer] exercises as appropriate with emphasis on quad control/strength, hip rotational strength, WB tolerance and functional mobility. Symptom modification strategies as indicated/appropriate. Pt interested in utilizing treadmill/nustep.    Alm DELENA Jenny PT, DPT 08/11/2023 7:53 AM

## 2023-08-13 ENCOUNTER — Ambulatory Visit: Payer: 59 | Admitting: Physical Therapy

## 2023-08-13 ENCOUNTER — Encounter: Payer: Self-pay | Admitting: Physical Therapy

## 2023-08-13 DIAGNOSIS — G8929 Other chronic pain: Secondary | ICD-10-CM

## 2023-08-13 DIAGNOSIS — M170 Bilateral primary osteoarthritis of knee: Secondary | ICD-10-CM | POA: Diagnosis not present

## 2023-08-13 DIAGNOSIS — R2689 Other abnormalities of gait and mobility: Secondary | ICD-10-CM

## 2023-08-13 NOTE — Therapy (Signed)
 OUTPATIENT PHYSICAL THERAPY TREATMENT   Patient Name: Jose Waller MRN: [id number] DOB:05-05-1951, 73 y.o., male Today's Date: 08/13/2023  END OF SESSION:  PT End of Session - 08/13/23 0714     Visit Number 4    Number of Visits 17    Date for PT Re-Evaluation 09/29/23    Authorization Type [employer]    PT Start Time 0714    PT Stop Time 0753    PT Time Calculation (min) 39 min    Activity Tolerance Patient tolerated treatment well                Past Medical History:  Diagnosis Date   Allergy    Arthritis    Cataract    MD just watching   CKD (chronic kidney disease) stage 3, GFR 30-59 ml/min (HCC) 06/05/2018   Cluster headache    resolved   Glaucoma    Hypertension    Substance abuse (HCC)    cocaine - quit 1998   Past Surgical History:  Procedure Laterality Date   back lumbar  2004   fusion    COLONOSCOPY  06/09/2009   Dr Lucius Sabins - normal   WISDOM TOOTH EXTRACTION     Patient Active Problem List   Diagnosis Date Noted   Chronic pain of left knee 02/21/2022   Erectile dysfunction 04/23/2021   IFG (impaired fasting glucose) 10/23/2020   OAB (overactive bladder) 10/20/2020   Anorexia 02/09/2020   Combined forms of age-related cataract of left eye 09/23/2019   Primary osteoarthritis of both knees 04/22/2019   Slurred speech 01/28/2019   Urinary urgency 01/28/2019   CKD stage G3a/A1, GFR 45-59 and albumin creatinine ratio <30 mg/g (HCC) 06/05/2018   Migraine 09/08/2013   Low back pain 09/08/2013   Hyperlipidemia 12/31/2010   GERD 11/08/2009   HYPERTENSION 04/22/2008    PCP: Cydney Draft, MD  REFERRING PROVIDER: Gean Keels, MD  REFERRING DIAG: M17.0 (ICD-10-CM) - Primary osteoarthritis of both knees  THERAPY DIAG:  Chronic pain of both knees  Other abnormalities of gait and mobility  Rationale for Evaluation and Treatment: Rehabilitation  ONSET DATE: about a year and a half  SUBJECTIVE:  Per eval - Pt reports moving  into new apartment about a year and a half ago, subsequent increase in BIL knee pain. Denies any MOI or trauma. States he has had injections in both knees, R knee essentially resolved at this point, L knee improved but not quite to the same degree as R. Most difficulty with WB tasks. Pt notes symptoms improve significantly with brace use. He reports sometimes feeling burning in knees but thinks injections have helped with that. Pt denies any N/T, swelling, or fevers/night sweats.  Pt states he used to be fairly active, travelling to flea markets. Wants to try to get back into the gym, interested in water aerobics.   SUBJECTIVE STATEMENT: 08/13/2023 4/10 pain at present, "felt great" after last session. No other new updates.    PERTINENT HISTORY: headaches, HTN, CKD  PAIN:  Are you having pain: 4/10 L knee  Per eval -  Location/description: lateral L knee, aching and sharp at times Best-worst over past week: 0-8/10  - aggravating factors: standing 10-15 min, walking 10-15 - Easing factors: pain cream, injections, brace    PRECAUTIONS: None  WEIGHT BEARING RESTRICTIONS: No  FALLS:  Has patient fallen in last 6 months? No  LIVING ENVIRONMENT: 2 level apartment w/ elevator, lives in assisted living facility Pt takes  care of housework  OCCUPATION: retired Clinical research associate for [employer]  PLOF: Independent  PATIENT GOALS: getting back in gym, water aerobics, treadmill  NEXT MD VISIT: TBD per pt report   OBJECTIVE:  Note: Objective measures were completed at Evaluation unless otherwise noted.  DIAGNOSTIC FINDINGS:  No recent imaging in chart per EPIC review  PATIENT SURVEYS:  FOTO 60 > 62  COGNITION: Overall cognitive status: Within functional limits for tasks assessed     SENSATION: Light touch grossly symmetrical BIL LE  PALPATION: Concordant tenderness L lateral joint line, otherwise unremarkable overall.   LOWER EXTREMITY ROM:     Active  Right eval Left eval L  08/11/23  Hip flexion     Hip extension     Hip internal rotation     Hip external rotation     Knee extension A/P: 0 A: lacking 12 deg against gravity  P: full  A: lacking 8 degrees against gravity   Knee flexion A: 112  P: 120 A: 110 deg *  P: 120 deg *   (Blank rows = not tested) (Key: WFL = within functional limits not formally assessed, * = concordant pain, s = stiffness/stretching sensation, NT = not tested)  Comments:    LOWER EXTREMITY MMT:    MMT Right eval Left eval  Hip flexion 4 4  Hip abduction (modified sitting) 5 5  Hip internal rotation 4+ 4 *  Hip external rotation 4 + 4 *  Knee flexion 5 5  Knee extension 4+ 4+ (within available ROM)  Ankle dorsiflexion     (Blank rows = not tested) (Key: WFL = within functional limits not formally assessed, * = concordant pain, s = stiffness/stretching sensation, NT = not tested)  Comments:    LOWER EXTREMITY SPECIAL TESTS:  deferred  FUNCTIONAL TESTS:  5xSTS: 13.58sec w/o UE support   GAIT: Distance walked: within clinic Assistive device utilized: None Level of assistance: Complete Independence Comments: widened BOS, reduced L knee ext in stance, reduced hip ext BIL, increased lateral weightshifting BIL and intermittent steadying on nearby objects                                                                                                                                TREATMENT DATE:  [facility] Adult PT Treatment:                                                DATE: 08/13/23 Therapeutic Exercise: Nu step L5 UE/LE 5 min during subjective; UE @9 , seat @8 , L5-6 resistance Seated quad set w/ heel prop on 2 steps; 2x12 BIL cues for quad contraction BW STS 2x8 from lowest mat cues for trunk mechanics 4 inch alternating step ups x10 BIL cues for knee ext Mini side lunge 2x8 BIL w/ UE  support  HEP discussion/education   [facility] Adult PT Treatment:                                                DATE:  08/11/23 Therapeutic Exercise: LAQ 2x8 BIL LE cues for trunk mechanics and pacing  Seated bosu TKE x15 BIL LE cues for quad contraction 4 inch fwd step up w/ UE support, 2x8 BIL cues for full knee extension and posture at top of movement STS mat + airex 2x8 cues for posture and trunk lean Standing heel/toe raises 2x15 w/ UE support  Mini lunge w/ UE support x10 cues for comfortable ROM  Propped quad set x12 BIL HEP update + education/handout    [facility] Adult PT Treatment:                                                DATE: 08/06/23 Therapeutic Exercise: Adduction iso 2x12 March, standing 2x5 BIL cues for posture and breath control STS lowest mat 3x5 cues for pacing and trunk mechanics, BOS Hee/toe raises at counter 2x10  Seated TKE into bosy 2x8 BIL cues for breath control and quad contraction Seated quad set w/ heel prop 3x8 BIL cues for quad contraction and comfortable ROM, breath control 6 inch step weight shift 2x8 BIL cues for quad/hamstring co contraction Verbal HEP review/discussion     PATIENT EDUCATION:  Education details: rationale for interventions, HEP  Person educated: Patient Education method: Explanation, Demonstration, Tactile cues, Verbal cues Education comprehension: verbalized understanding, returned demonstration, verbal cues required, tactile cues required, and needs further education     HOME EXERCISE PROGRAM: Access Code: [email or web address] URL: [email or web address] Date: 08/11/2023 Prepared by: Mayme Spearman  Exercises - Sit to Stand with Armchair  - 2-3 x daily - 1 sets - 5 reps - Standing March with Counter Support  - 2-3 x daily - 1 sets - 5 reps - Seated Quad Set  - 2-3 x daily - 1 sets - 8 reps - Seated Long Arc Quad  - 2-3 x daily - 1 sets - 8 reps  ASSESSMENT:  CLINICAL IMPRESSION: 08/13/2023 Pt arrives w/ 4/10 pain, no issues after last session and continues to report good relief after PT sessions. Continues to do well with activities in  session, able to progress for increased volume with closed chain activities. Pt tolerates well overall without increase in pain aside from mild/transient painful popping on last rep of mini side lunges - pt states this is common for him and tends to occur 1-2x/day, denies any increase in resting pain afterwards. Departs with report of no pain, no adverse events. Recommend continuing along current POC in order to address relevant deficits and improve functional tolerance. Pt departs today's session in no acute distress, all voiced questions/concerns addressed appropriately from PT perspective.    Per eval - Patient is a very pleasant 73 y.o. gentleman who was seen today for physical therapy evaluation and treatment for bilateral knee pain, L>R. Symptoms ongoing for about a year and a half per pt and limiting tolerance to WB tasks such as walking and standing, although pt notes improvement since onset. On exam he demonstrates discrepancy between active and passive movement of both knees, reduced quad strength against  gravity on L, and reduced rotational hip strength. 5xSTS around cutoff score for fall risk and reduced functional mobility (12-14sec varying by population). Pt tolerates exam/HEP well without increase in pain and no adverse events. Recommend trial of skilled PT to address aforementioned deficits with aim of improving functional tolerance and reducing pain with typical activities. Pt departs today's session in no acute distress, all voiced concerns/questions addressed appropriately from PT perspective.    OBJECTIVE IMPAIRMENTS: Abnormal gait, decreased activity tolerance, decreased balance, decreased endurance, decreased mobility, difficulty walking, decreased ROM, decreased strength, improper body mechanics, and pain.   ACTIVITY LIMITATIONS: carrying, lifting, standing, squatting, transfers, and locomotion level  PARTICIPATION LIMITATIONS: meal prep, cleaning, laundry, and community  activity  PERSONAL FACTORS: Age, Time since onset of injury/illness/exacerbation, and 3+ comorbidities: headaches, HTN, CKD  are also affecting patient's functional outcome.   REHAB POTENTIAL: Good  CLINICAL DECISION MAKING: Stable/uncomplicated  EVALUATION COMPLEXITY: Low   GOALS: Goals reviewed with patient? Yes  SHORT TERM GOALS: Target date: 09/01/2023 Pt will demonstrate appropriate understanding and performance of initially prescribed HEP in order to facilitate improved independence with management of symptoms.  Baseline: HEP provided on eval Goal status: INITIAL   2. Pt will report at least 25% decrease in overall pain levels in past week in order to facilitate improved tolerance to basic ADLs/mobility.   Baseline: 0-8/10  Goal status: INITIAL    LONG TERM GOALS: Target date: 09/29/2023 Pt will score 62 or greater on FOTO in order to demonstrate improved perception of function due to symptoms.  Baseline: 60 Goal status: INITIAL  2.  Pt will demonstrate at least 0-120 degrees of knee AROM in order to facilitate improved tolerance to functional movements such as walking/squatting.  Baseline: see ROM chart above Goal status: INITIAL  3.  Pt will report/demonstrate ability to stand/walk up to 30 min w/ less than 3 pt increase in resting pain in order to facilitate improved tolerance to daily tasks/mobility.  Baseline: difficulty tolerating >10-15 min standing/walking without increase in pain Goal status: INITIAL  4.  Pt will be able to perform 5xSTS in less than or equal to 10sec in order to demonstrate reduced fall risk and improved functional independence (MCID 5xSTS = 2.3 sec). Baseline: 13sec  Goal status: INITIAL   5. Pt will report at least 50% decrease in overall pain levels in past week in order to facilitate improved tolerance to basic ADLs/mobility.   Baseline: 0-8/10  Goal status: INITIAL     PLAN:  PT FREQUENCY: 2x/week  PT DURATION: 8 weeks  PLANNED  INTERVENTIONS: 97164- PT Re-evaluation, 97110-Therapeutic exercises, 97530- Therapeutic activity, [id number]- Neuromuscular re-education, 97535- Self Care, 16109- Manual therapy, [phone]- Gait training, [phone]- Aquatic Therapy, Patient/Family education, Balance training, Stair training, Taping, Dry Needling, Joint mobilization, Cryotherapy, and Moist heat  PLAN FOR NEXT SESSION: Review/update HEP PRN. Work on [employer] exercises as appropriate with emphasis on quad control/strength, hip rotational strength, WB tolerance and functional mobility. Symptom modification strategies as indicated/appropriate.    Lovett Ruck PT, DPT 08/13/2023 7:58 AM

## 2023-08-16 ENCOUNTER — Other Ambulatory Visit: Payer: Self-pay | Admitting: Family Medicine

## 2023-08-16 DIAGNOSIS — I10 Essential (primary) hypertension: Secondary | ICD-10-CM

## 2023-08-18 ENCOUNTER — Other Ambulatory Visit: Payer: Self-pay | Admitting: *Deleted

## 2023-08-18 ENCOUNTER — Ambulatory Visit: Payer: 59 | Admitting: Physical Therapy

## 2023-08-18 ENCOUNTER — Encounter: Payer: Self-pay | Admitting: Physical Therapy

## 2023-08-18 DIAGNOSIS — R2689 Other abnormalities of gait and mobility: Secondary | ICD-10-CM

## 2023-08-18 DIAGNOSIS — G8929 Other chronic pain: Secondary | ICD-10-CM

## 2023-08-18 DIAGNOSIS — M170 Bilateral primary osteoarthritis of knee: Secondary | ICD-10-CM | POA: Diagnosis not present

## 2023-08-18 NOTE — Therapy (Signed)
OUTPATIENT PHYSICAL THERAPY TREATMENT   Patient Name: Jose Waller MRN: [id number] DOB:09/30/1950, 73 y.o., male Today's Date: 08/18/2023  END OF SESSION:  PT End of Session - 08/18/23 0714     Visit Number 5    Number of Visits 17    Date for PT Re-Evaluation 09/29/23    Authorization Type [employer]    PT Start Time 0715    PT Stop Time 0753    PT Time Calculation (min) 38 min    Activity Tolerance Patient tolerated treatment well                 Past Medical History:  Diagnosis Date   Allergy    Arthritis    Cataract    MD just watching   CKD (chronic kidney disease) stage 3, GFR 30-59 ml/min (HCC) 06/05/2018   Cluster headache    resolved   Glaucoma    Hypertension    Substance abuse (HCC)    cocaine - quit 1998   Past Surgical History:  Procedure Laterality Date   back lumbar  2004   fusion    COLONOSCOPY  06/09/2009   Dr Mellody Memos - normal   WISDOM TOOTH EXTRACTION     Patient Active Problem List   Diagnosis Date Noted   Chronic pain of left knee 02/21/2022   Erectile dysfunction 04/23/2021   IFG (impaired fasting glucose) 10/23/2020   OAB (overactive bladder) 10/20/2020   Anorexia 02/09/2020   Combined forms of age-related cataract of left eye 09/23/2019   Primary osteoarthritis of both knees 04/22/2019   Slurred speech 01/28/2019   Urinary urgency 01/28/2019   CKD stage G3a/A1, GFR 45-59 and albumin creatinine ratio <30 mg/g (HCC) 06/05/2018   Migraine 09/08/2013   Low back pain 09/08/2013   Hyperlipidemia 12/31/2010   GERD 11/08/2009   HYPERTENSION 04/22/2008    PCP: Agapito Games, MD  REFERRING PROVIDER: Monica Becton, MD  REFERRING DIAG: M17.0 (ICD-10-CM) - Primary osteoarthritis of both knees  THERAPY DIAG:  Chronic pain of both knees  Other abnormalities of gait and mobility  Rationale for Evaluation and Treatment: Rehabilitation  ONSET DATE: about a year and a half  SUBJECTIVE:  Per eval - Pt reports moving  into new apartment about a year and a half ago, subsequent increase in BIL knee pain. Denies any MOI or trauma. States he has had injections in both knees, R knee essentially resolved at this point, L knee improved but not quite to the same degree as R. Most difficulty with WB tasks. Pt notes symptoms improve significantly with brace use. He reports sometimes feeling burning in knees but thinks injections have helped with that. Pt denies any N/T, swelling, or fevers/night sweats.  Pt states he used to be fairly active, travelling to flea markets. Wants to try to get back into the gym, interested in water aerobics.   SUBJECTIVE STATEMENT: 08/18/2023 Pt states he felt great after last session, no pain until about Saturday. Overall continuing to report good progress with PT. HEP going well. States he hasn't been needing pain cream/medication as much. No other new updates.   PERTINENT HISTORY: headaches, HTN, CKD  PAIN:  Are you having pain: 5/10 L lateral knee Worst: 5/10  Per eval -  Location/description: lateral L knee, aching and sharp at times Best-worst over past week: 0-8/10  - aggravating factors: standing 10-15 min, walking 10-15 - Easing factors: pain cream, injections, brace    PRECAUTIONS: None  WEIGHT BEARING RESTRICTIONS:  No  FALLS:  Has patient fallen in last 6 months? No  LIVING ENVIRONMENT: 2 level apartment w/ elevator, lives in assisted living facility Pt takes care of housework  OCCUPATION: retired Clinical research associate for [employer]  PLOF: Independent  PATIENT GOALS: getting back in gym, water aerobics, treadmill  NEXT MD VISIT: TBD per pt report   OBJECTIVE:  Note: Objective measures were completed at Evaluation unless otherwise noted.  DIAGNOSTIC FINDINGS:  No recent imaging in chart per EPIC review  PATIENT SURVEYS:  FOTO 60 > 62  COGNITION: Overall cognitive status: Within functional limits for tasks assessed     SENSATION: Light touch grossly symmetrical  BIL LE  PALPATION: Concordant tenderness L lateral joint line, otherwise unremarkable overall.   LOWER EXTREMITY ROM:     Active  Right eval Left eval L 08/11/23 L 08/18/23  Hip flexion      Hip extension      Hip internal rotation      Hip external rotation      Knee extension A/P: 0 A: lacking 12 deg against gravity  P: full  A: lacking 8 degrees against gravity  A: lacking 4 deg against gravity   Knee flexion A: 112  P: 120 A: 110 deg *  P: 120 deg *  A: 118 deg * supine  (Blank rows = not tested) (Key: WFL = within functional limits not formally assessed, * = concordant pain, s = stiffness/stretching sensation, NT = not tested)  Comments:    LOWER EXTREMITY MMT:    MMT Right eval Left eval  Hip flexion 4 4  Hip abduction (modified sitting) 5 5  Hip internal rotation 4+ 4 *  Hip external rotation 4 + 4 *  Knee flexion 5 5  Knee extension 4+ 4+ (within available ROM)  Ankle dorsiflexion     (Blank rows = not tested) (Key: WFL = within functional limits not formally assessed, * = concordant pain, s = stiffness/stretching sensation, NT = not tested)  Comments:    LOWER EXTREMITY SPECIAL TESTS:  deferred  FUNCTIONAL TESTS:  5xSTS: 13.58sec w/o UE support 08/18/23 5xSTS: 11.95sec no UE support, mild L knee pain   GAIT: Distance walked: within clinic Assistive device utilized: None Level of assistance: Complete Independence Comments: widened BOS, reduced L knee ext in stance, reduced hip ext BIL, increased lateral weightshifting BIL and intermittent steadying on nearby objects                                                                                                                                TREATMENT DATE:  [facility] Adult PT Treatment:                                                DATE: 08/18/23 Therapeutic Exercise: Nu step  seat @ 10, LE only, L4; 5 min during subjective only  3# LAQ 3x6 LLE cues for pacing and quad contraction BW sit<>stand 3x5  (including 5xSTS) cues for mechanics and pacing, increased ascent velocity Red band hip abduction 3x12 BIL Standing march w/ UE support x10 BIL cues for posture Mini side lunge x5 BIL cues for comfortable ROM and knee mechanics HEP update + education/handout, rationale for interventions, relevant anatomy/physiology     [facility] Adult PT Treatment:                                                DATE: 08/13/23 Therapeutic Exercise: Nu step L5 UE/LE 5 min during subjective; UE @9 , seat @8 , L5-6 resistance Seated quad set w/ heel prop on 2 steps; 2x12 BIL cues for quad contraction BW STS 2x8 from lowest mat cues for trunk mechanics 4 inch alternating step ups x10 BIL cues for knee ext Mini side lunge 2x8 BIL w/ UE support  HEP discussion/education   [facility] Adult PT Treatment:                                                DATE: 08/11/23 Therapeutic Exercise: LAQ 2x8 BIL LE cues for trunk mechanics and pacing  Seated bosu TKE x15 BIL LE cues for quad contraction 4 inch fwd step up w/ UE support, 2x8 BIL cues for full knee extension and posture at top of movement STS mat + airex 2x8 cues for posture and trunk lean Standing heel/toe raises 2x15 w/ UE support  Mini lunge w/ UE support x10 cues for comfortable ROM  Propped quad set x12 BIL HEP update + education/handout   PATIENT EDUCATION:  Education details: rationale for interventions, HEP  Person educated: Patient Education method: Explanation, Demonstration, Tactile cues, Verbal cues Education comprehension: verbalized understanding, returned demonstration, verbal cues required, tactile cues required, and needs further education     HOME EXERCISE PROGRAM: Access Code: [email or web address] URL: [email or web address] Date: 08/18/2023 Prepared by: Fransisco Hertz  Exercises - Sit to Stand with Armchair  - 2-3 x daily - 1 sets - 5 reps - Seated Quad Set  - 2-3 x daily - 1 sets - 12 reps - Seated Long Arc Quad  - 2-3 x daily - 1 sets - 12  reps - Side Lunge with Counter Support  - 2-3 x daily - 1 sets - 5 reps  ASSESSMENT:  CLINICAL IMPRESSION: 08/18/2023 Pt arrives w/ 5/10 pain, reports multiple days of relief after last session. Today continuing to progress open and closed chain LE strengthening which pt tolerates well. Also progressing for frontal plane hip strengthening, limited by hip fatigue. Improved tolerance to mini side lunges today without any incidence of popping/pain, added to HEP with cues/education on appropriate mechanics. No adverse events, pt reports resolution of pain on departure. Cues as above. Recommend continuing along current POC in order to address relevant deficits and improve functional tolerance. Pt departs today's session in no acute distress, all voiced questions/concerns addressed appropriately from PT perspective.     Per eval - Patient is a very pleasant 73 y.o. gentleman who was seen today for physical therapy evaluation and treatment for bilateral knee pain, L>R. Symptoms ongoing for about a year  and a half per pt and limiting tolerance to WB tasks such as walking and standing, although pt notes improvement since onset. On exam he demonstrates discrepancy between active and passive movement of both knees, reduced quad strength against gravity on L, and reduced rotational hip strength. 5xSTS around cutoff score for fall risk and reduced functional mobility (12-14sec varying by population). Pt tolerates exam/HEP well without increase in pain and no adverse events. Recommend trial of skilled PT to address aforementioned deficits with aim of improving functional tolerance and reducing pain with typical activities. Pt departs today's session in no acute distress, all voiced concerns/questions addressed appropriately from PT perspective.    OBJECTIVE IMPAIRMENTS: Abnormal gait, decreased activity tolerance, decreased balance, decreased endurance, decreased mobility, difficulty walking, decreased ROM, decreased  strength, improper body mechanics, and pain.   ACTIVITY LIMITATIONS: carrying, lifting, standing, squatting, transfers, and locomotion level  PARTICIPATION LIMITATIONS: meal prep, cleaning, laundry, and community activity  PERSONAL FACTORS: Age, Time since onset of injury/illness/exacerbation, and 3+ comorbidities: headaches, HTN, CKD  are also affecting patient's functional outcome.   REHAB POTENTIAL: Good  CLINICAL DECISION MAKING: Stable/uncomplicated  EVALUATION COMPLEXITY: Low   GOALS: Goals reviewed with patient? Yes  SHORT TERM GOALS: Target date: 09/01/2023 Pt will demonstrate appropriate understanding and performance of initially prescribed HEP in order to facilitate improved independence with management of symptoms.  Baseline: HEP provided on eval Goal status: INITIAL   2. Pt will report at least 25% decrease in overall pain levels in past week in order to facilitate improved tolerance to basic ADLs/mobility.   Baseline: 0-8/10  Goal status: INITIAL    LONG TERM GOALS: Target date: 09/29/2023 Pt will score 62 or greater on FOTO in order to demonstrate improved perception of function due to symptoms.  Baseline: 60 Goal status: INITIAL  2.  Pt will demonstrate at least 0-120 degrees of knee AROM in order to facilitate improved tolerance to functional movements such as walking/squatting.  Baseline: see ROM chart above Goal status: INITIAL  3.  Pt will report/demonstrate ability to stand/walk up to 30 min w/ less than 3 pt increase in resting pain in order to facilitate improved tolerance to daily tasks/mobility.  Baseline: difficulty tolerating >10-15 min standing/walking without increase in pain Goal status: INITIAL  4.  Pt will be able to perform 5xSTS in less than or equal to 10sec in order to demonstrate reduced fall risk and improved functional independence (MCID 5xSTS = 2.3 sec). Baseline: 13sec  Goal status: INITIAL   5. Pt will report at least 50% decrease in  overall pain levels in past week in order to facilitate improved tolerance to basic ADLs/mobility.   Baseline: 0-8/10  Goal status: INITIAL     PLAN:  PT FREQUENCY: 2x/week  PT DURATION: 8 weeks  PLANNED INTERVENTIONS: 97164- PT Re-evaluation, 97110-Therapeutic exercises, 97530- Therapeutic activity, [id number]- Neuromuscular re-education, 97535- Self Care, [id number]- Manual therapy, [phone]- Gait training, [phone]- Aquatic Therapy, Patient/Family education, Balance training, Stair training, Taping, Dry Needling, Joint mobilization, Cryotherapy, and Moist heat  PLAN FOR NEXT SESSION: Review/update HEP PRN. Work on [employer] exercises as appropriate with emphasis on quad control/strength, hip rotational strength, WB tolerance and functional mobility. Symptom modification strategies as indicated/appropriate.    Ashley Murrain PT, DPT 08/18/2023 7:59 AM

## 2023-08-19 NOTE — Therapy (Signed)
OUTPATIENT PHYSICAL THERAPY TREATMENT   Patient Name: Jose Waller MRN: [id number] DOB:July 02, 1951, 73 y.o., male Today's Date: 08/20/2023  END OF SESSION:  PT End of Session - 08/20/23 0715     Visit Number 6    Number of Visits 17    Date for PT Re-Evaluation 09/29/23    Authorization Type [employer]    PT Start Time 0715    PT Stop Time 0754    PT Time Calculation (min) 39 min                  Past Medical History:  Diagnosis Date   Allergy    Arthritis    Cataract    MD just watching   CKD (chronic kidney disease) stage 3, GFR 30-59 ml/min (HCC) 06/05/2018   Cluster headache    resolved   Glaucoma    Hypertension    Substance abuse (HCC)    cocaine - quit 1998   Past Surgical History:  Procedure Laterality Date   back lumbar  2004   fusion    COLONOSCOPY  06/09/2009   Dr Mellody Memos - normal   WISDOM TOOTH EXTRACTION     Patient Active Problem List   Diagnosis Date Noted   Chronic pain of left knee 02/21/2022   Erectile dysfunction 04/23/2021   IFG (impaired fasting glucose) 10/23/2020   OAB (overactive bladder) 10/20/2020   Anorexia 02/09/2020   Combined forms of age-related cataract of left eye 09/23/2019   Primary osteoarthritis of both knees 04/22/2019   Slurred speech 01/28/2019   Urinary urgency 01/28/2019   CKD stage G3a/A1, GFR 45-59 and albumin creatinine ratio <30 mg/g (HCC) 06/05/2018   Migraine 09/08/2013   Low back pain 09/08/2013   Hyperlipidemia 12/31/2010   GERD 11/08/2009   HYPERTENSION 04/22/2008    PCP: Agapito Games, MD  REFERRING PROVIDER: Monica Becton, MD  REFERRING DIAG: M17.0 (ICD-10-CM) - Primary osteoarthritis of both knees  THERAPY DIAG:  Chronic pain of both knees  Other abnormalities of gait and mobility  Rationale for Evaluation and Treatment: Rehabilitation  ONSET DATE: about a year and a half  SUBJECTIVE:  Per eval - Pt reports moving into new apartment about a year and a half ago,  subsequent increase in BIL knee pain. Denies any MOI or trauma. States he has had injections in both knees, R knee essentially resolved at this point, L knee improved but not quite to the same degree as R. Most difficulty with WB tasks. Pt notes symptoms improve significantly with brace use. He reports sometimes feeling burning in knees but thinks injections have helped with that. Pt denies any N/T, swelling, or fevers/night sweats.  Pt states he used to be fairly active, travelling to flea markets. Wants to try to get back into the gym, interested in water aerobics.   SUBJECTIVE STATEMENT: 08/20/2023 Pt states he is feeling okay this morning, about 2/10 pain. Felt good after last session, HEP going well. No other new updates    PERTINENT HISTORY: headaches, HTN, CKD  PAIN:  Are you having pain: 2/10 L knee Worst: 5/10  Per eval -  Location/description: lateral L knee, aching and sharp at times Best-worst over past week: 0-8/10  - aggravating factors: standing 10-15 min, walking 10-15 - Easing factors: pain cream, injections, brace    PRECAUTIONS: None  WEIGHT BEARING RESTRICTIONS: No  FALLS:  Has patient fallen in last 6 months? No  LIVING ENVIRONMENT: 2 level apartment w/ elevator, lives in  assisted living facility Pt takes care of housework  OCCUPATION: retired Clinical research associate for [employer]  PLOF: Independent  PATIENT GOALS: getting back in gym, water aerobics, treadmill  NEXT MD VISIT: TBD per pt report   OBJECTIVE:  Note: Objective measures were completed at Evaluation unless otherwise noted.  DIAGNOSTIC FINDINGS:  No recent imaging in chart per EPIC review  PATIENT SURVEYS:  FOTO 60 > 62  COGNITION: Overall cognitive status: Within functional limits for tasks assessed     SENSATION: Light touch grossly symmetrical BIL LE  PALPATION: Concordant tenderness L lateral joint line, otherwise unremarkable overall.   LOWER EXTREMITY ROM:     Active  Right eval  Left eval L 08/11/23 L 08/18/23  Hip flexion      Hip extension      Hip internal rotation      Hip external rotation      Knee extension A/P: 0 A: lacking 12 deg against gravity  P: full  A: lacking 8 degrees against gravity  A: lacking 4 deg against gravity   Knee flexion A: 112  P: 120 A: 110 deg *  P: 120 deg *  A: 118 deg * supine  (Blank rows = not tested) (Key: WFL = within functional limits not formally assessed, * = concordant pain, s = stiffness/stretching sensation, NT = not tested)  Comments:    LOWER EXTREMITY MMT:    MMT Right eval Left eval  Hip flexion 4 4  Hip abduction (modified sitting) 5 5  Hip internal rotation 4+ 4 *  Hip external rotation 4 + 4 *  Knee flexion 5 5  Knee extension 4+ 4+ (within available ROM)  Ankle dorsiflexion     (Blank rows = not tested) (Key: WFL = within functional limits not formally assessed, * = concordant pain, s = stiffness/stretching sensation, NT = not tested)  Comments:    LOWER EXTREMITY SPECIAL TESTS:  deferred  FUNCTIONAL TESTS:  5xSTS: 13.58sec w/o UE support 08/18/23 5xSTS: 11.95sec no UE support, mild L knee pain   GAIT: Distance walked: within clinic Assistive device utilized: None Level of assistance: Complete Independence Comments: widened BOS, reduced L knee ext in stance, reduced hip ext BIL, increased lateral weightshifting BIL and intermittent steadying on nearby objects                                                                                                                                TREATMENT DATE:  [facility] Adult PT Treatment:                                                DATE: 08/20/23 Therapeutic Exercise: Nu step L4 5 min LE only during subjective  Seated bosu TKE 2x10 BIL cues for quad contraction, breath control  STS 5#  3x5 lowest mat cues for pacing and trunk mechanics  Lateral stepping 4 laps along counter cues for pacing, foot positioning Green band hip abduction 2x8 cues for  posture and control  Mini side lunge 2x5 BIL tactile cues at hips/knees for reduced lateral stress HEP education/discussion     [facility] Adult PT Treatment:                                                DATE: 08/18/23 Therapeutic Exercise: Nu step seat @ 10, LE only, L4; 5 min during subjective only  3# LAQ 3x6 LLE cues for pacing and quad contraction BW sit<>stand 3x5 (including 5xSTS) cues for mechanics and pacing, increased ascent velocity Red band hip abduction 3x12 BIL Standing march w/ UE support x10 BIL cues for posture Mini side lunge x5 BIL cues for comfortable ROM and knee mechanics HEP update + education/handout, rationale for interventions, relevant anatomy/physiology     [facility] Adult PT Treatment:                                                DATE: 08/13/23 Therapeutic Exercise: Nu step L5 UE/LE 5 min during subjective; UE @9 , seat @8 , L5-6 resistance Seated quad set w/ heel prop on 2 steps; 2x12 BIL cues for quad contraction BW STS 2x8 from lowest mat cues for trunk mechanics 4 inch alternating step ups x10 BIL cues for knee ext Mini side lunge 2x8 BIL w/ UE support  HEP discussion/education   PATIENT EDUCATION:  Education details: rationale for interventions, HEP  Person educated: Patient Education method: Explanation, Demonstration, Tactile cues, Verbal cues Education comprehension: verbalized understanding, returned demonstration, verbal cues required, tactile cues required, and needs further education     HOME EXERCISE PROGRAM: Access Code: [email or web address] URL: [email or web address] Date: 08/18/2023 Prepared by: Fransisco Hertz  Exercises - Sit to Stand with Armchair  - 2-3 x daily - 1 sets - 5 reps - Seated Quad Set  - 2-3 x daily - 1 sets - 12 reps - Seated Long Arc Quad  - 2-3 x daily - 1 sets - 12 reps - Side Lunge with Counter Support  - 2-3 x daily - 1 sets - 5 reps  ASSESSMENT:  CLINICAL IMPRESSION: 08/20/2023 Pt arrives w/ 2/10 pain, continues  to report good relief after sessions and improving pain overall. Today continuing to progress strengthening exercises for increased frontal plane focus, increased hip strengthening. Improved mechanics with side lunges today with tc/vc to reduce lateral stress. No adverse events, tolerates well overall, cues as above. Recommend continuing along current POC in order to address relevant deficits and improve functional tolerance. Pt departs today's session in no acute distress, all voiced questions/concerns addressed appropriately from PT perspective.     Per eval - Patient is a very pleasant 73 y.o. gentleman who was seen today for physical therapy evaluation and treatment for bilateral knee pain, L>R. Symptoms ongoing for about a year and a half per pt and limiting tolerance to WB tasks such as walking and standing, although pt notes improvement since onset. On exam he demonstrates discrepancy between active and passive movement of both knees, reduced quad strength against gravity on L, and reduced rotational hip strength. 5xSTS around cutoff  score for fall risk and reduced functional mobility (12-14sec varying by population). Pt tolerates exam/HEP well without increase in pain and no adverse events. Recommend trial of skilled PT to address aforementioned deficits with aim of improving functional tolerance and reducing pain with typical activities. Pt departs today's session in no acute distress, all voiced concerns/questions addressed appropriately from PT perspective.    OBJECTIVE IMPAIRMENTS: Abnormal gait, decreased activity tolerance, decreased balance, decreased endurance, decreased mobility, difficulty walking, decreased ROM, decreased strength, improper body mechanics, and pain.   ACTIVITY LIMITATIONS: carrying, lifting, standing, squatting, transfers, and locomotion level  PARTICIPATION LIMITATIONS: meal prep, cleaning, laundry, and community activity  PERSONAL FACTORS: Age, Time since onset of  injury/illness/exacerbation, and 3+ comorbidities: headaches, HTN, CKD  are also affecting patient's functional outcome.   REHAB POTENTIAL: Good  CLINICAL DECISION MAKING: Stable/uncomplicated  EVALUATION COMPLEXITY: Low   GOALS: Goals reviewed with patient? Yes  SHORT TERM GOALS: Target date: 09/01/2023 Pt will demonstrate appropriate understanding and performance of initially prescribed HEP in order to facilitate improved independence with management of symptoms.  Baseline: HEP provided on eval Goal status: INITIAL   2. Pt will report at least 25% decrease in overall pain levels in past week in order to facilitate improved tolerance to basic ADLs/mobility.   Baseline: 0-8/10  Goal status: INITIAL    LONG TERM GOALS: Target date: 09/29/2023 Pt will score 62 or greater on FOTO in order to demonstrate improved perception of function due to symptoms.  Baseline: 60 Goal status: INITIAL  2.  Pt will demonstrate at least 0-120 degrees of knee AROM in order to facilitate improved tolerance to functional movements such as walking/squatting.  Baseline: see ROM chart above Goal status: INITIAL  3.  Pt will report/demonstrate ability to stand/walk up to 30 min w/ less than 3 pt increase in resting pain in order to facilitate improved tolerance to daily tasks/mobility.  Baseline: difficulty tolerating >10-15 min standing/walking without increase in pain Goal status: INITIAL  4.  Pt will be able to perform 5xSTS in less than or equal to 10sec in order to demonstrate reduced fall risk and improved functional independence (MCID 5xSTS = 2.3 sec). Baseline: 13sec  Goal status: INITIAL   5. Pt will report at least 50% decrease in overall pain levels in past week in order to facilitate improved tolerance to basic ADLs/mobility.   Baseline: 0-8/10  Goal status: INITIAL     PLAN:  PT FREQUENCY: 2x/week  PT DURATION: 8 weeks  PLANNED INTERVENTIONS: 97164- PT Re-evaluation, 97110-Therapeutic  exercises, 97530- Therapeutic activity, [id number]- Neuromuscular re-education, 97535- Self Care, 10626- Manual therapy, [phone]- Gait training, [phone]- Aquatic Therapy, Patient/Family education, Balance training, Stair training, Taping, Dry Needling, Joint mobilization, Cryotherapy, and Moist heat  PLAN FOR NEXT SESSION: Review/update HEP PRN. Work on [employer] exercises as appropriate with emphasis on quad control/strength, hip rotational strength, WB tolerance and functional mobility. Symptom modification strategies as indicated/appropriate.    Ashley Murrain PT, DPT 08/20/2023 7:58 AM

## 2023-08-20 ENCOUNTER — Ambulatory Visit: Payer: 59 | Admitting: Physical Therapy

## 2023-08-20 ENCOUNTER — Encounter: Payer: Self-pay | Admitting: Physical Therapy

## 2023-08-20 DIAGNOSIS — R2689 Other abnormalities of gait and mobility: Secondary | ICD-10-CM

## 2023-08-20 DIAGNOSIS — G8929 Other chronic pain: Secondary | ICD-10-CM

## 2023-08-20 DIAGNOSIS — M170 Bilateral primary osteoarthritis of knee: Secondary | ICD-10-CM | POA: Diagnosis not present

## 2023-08-25 ENCOUNTER — Encounter: Payer: Self-pay | Admitting: Physical Therapy

## 2023-08-25 ENCOUNTER — Ambulatory Visit: Payer: 59 | Admitting: Physical Therapy

## 2023-08-25 DIAGNOSIS — R2689 Other abnormalities of gait and mobility: Secondary | ICD-10-CM

## 2023-08-25 DIAGNOSIS — G8929 Other chronic pain: Secondary | ICD-10-CM

## 2023-08-25 DIAGNOSIS — M170 Bilateral primary osteoarthritis of knee: Secondary | ICD-10-CM | POA: Diagnosis not present

## 2023-08-25 NOTE — Therapy (Signed)
OUTPATIENT PHYSICAL THERAPY TREATMENT   Patient Name: Jose Waller MRN: [id number] DOB:28-Mar-1951, 73 y.o., male Today's Date: 08/25/2023  END OF SESSION:  PT End of Session - 08/25/23 0715     Visit Number 7    Number of Visits 17    Date for PT Re-Evaluation 09/29/23    Authorization Type [employer]    PT Start Time 0715    PT Stop Time 0747    PT Time Calculation (min) 32 min    Activity Tolerance Patient tolerated treatment well                   Past Medical History:  Diagnosis Date   Allergy    Arthritis    Cataract    MD just watching   CKD (chronic kidney disease) stage 3, GFR 30-59 ml/min (HCC) 06/05/2018   Cluster headache    resolved   Glaucoma    Hypertension    Substance abuse (HCC)    cocaine - quit 1998   Past Surgical History:  Procedure Laterality Date   back lumbar  2004   fusion    COLONOSCOPY  06/09/2009   Dr Mellody Memos - normal   WISDOM TOOTH EXTRACTION     Patient Active Problem List   Diagnosis Date Noted   Chronic pain of left knee 02/21/2022   Erectile dysfunction 04/23/2021   IFG (impaired fasting glucose) 10/23/2020   OAB (overactive bladder) 10/20/2020   Anorexia 02/09/2020   Combined forms of age-related cataract of left eye 09/23/2019   Primary osteoarthritis of both knees 04/22/2019   Slurred speech 01/28/2019   Urinary urgency 01/28/2019   CKD stage G3a/A1, GFR 45-59 and albumin creatinine ratio <30 mg/g (HCC) 06/05/2018   Migraine 09/08/2013   Low back pain 09/08/2013   Hyperlipidemia 12/31/2010   GERD 11/08/2009   HYPERTENSION 04/22/2008    PCP: Agapito Games, MD  REFERRING PROVIDER: Monica Becton, MD  REFERRING DIAG: M17.0 (ICD-10-CM) - Primary osteoarthritis of both knees  THERAPY DIAG:  Chronic pain of both knees  Other abnormalities of gait and mobility  Rationale for Evaluation and Treatment: Rehabilitation  ONSET DATE: about a year and a half  SUBJECTIVE:  Per eval - Pt reports  moving into new apartment about a year and a half ago, subsequent increase in BIL knee pain. Denies any MOI or trauma. States he has had injections in both knees, R knee essentially resolved at this point, L knee improved but not quite to the same degree as R. Most difficulty with WB tasks. Pt notes symptoms improve significantly with brace use. He reports sometimes feeling burning in knees but thinks injections have helped with that. Pt denies any N/T, swelling, or fevers/night sweats.  Pt states he used to be fairly active, travelling to flea markets. Wants to try to get back into the gym, interested in water aerobics.   SUBJECTIVE STATEMENT: 08/25/2023 Pt requests today's visit be shortened given his upcoming travels. He states his knee is feeling good this morning, continued relief after PT sessions. He does mention that he woke up feeling a bit dizzy this morning - improved as he is up and moving around but still present. Denies any other symptoms. States he would like to proceed with PT      PERTINENT HISTORY: headaches, HTN, CKD  PAIN:  Are you having pain: 2/10 L knee   Worst: 4/10    Per eval -  Location/description: lateral L knee, aching and sharp  at times Best-worst over past week: 0-8/10  - aggravating factors: standing 10-15 min, walking 10-15 - Easing factors: pain cream, injections, brace    PRECAUTIONS: None  WEIGHT BEARING RESTRICTIONS: No  FALLS:  Has patient fallen in last 6 months? No  LIVING ENVIRONMENT: 2 level apartment w/ elevator, lives in assisted living facility Pt takes care of housework  OCCUPATION: retired Clinical research associate for [employer]  PLOF: Independent  PATIENT GOALS: getting back in gym, water aerobics, treadmill  NEXT MD VISIT: TBD per pt report   OBJECTIVE:  Note: Objective measures were completed at Evaluation unless otherwise noted.  DIAGNOSTIC FINDINGS:  No recent imaging in chart per EPIC review  PATIENT SURVEYS:  FOTO 60 > 62 FOTO  08/25/23: 59   COGNITION: Overall cognitive status: Within functional limits for tasks assessed     SENSATION: Light touch grossly symmetrical BIL LE  PALPATION: Concordant tenderness L lateral joint line, otherwise unremarkable overall.   LOWER EXTREMITY ROM:     Active  Right eval Left eval L 08/11/23 L 08/18/23  Hip flexion      Hip extension      Hip internal rotation      Hip external rotation      Knee extension A/P: 0 A: lacking 12 deg against gravity  P: full  A: lacking 8 degrees against gravity  A: lacking 4 deg against gravity   Knee flexion A: 112  P: 120 A: 110 deg *  P: 120 deg *  A: 118 deg * supine  (Blank rows = not tested) (Key: WFL = within functional limits not formally assessed, * = concordant pain, s = stiffness/stretching sensation, NT = not tested)  Comments:    LOWER EXTREMITY MMT:    MMT Right eval Left eval  Hip flexion 4 4  Hip abduction (modified sitting) 5 5  Hip internal rotation 4+ 4 *  Hip external rotation 4 + 4 *  Knee flexion 5 5  Knee extension 4+ 4+ (within available ROM)  Ankle dorsiflexion     (Blank rows = not tested) (Key: WFL = within functional limits not formally assessed, * = concordant pain, s = stiffness/stretching sensation, NT = not tested)  Comments:    LOWER EXTREMITY SPECIAL TESTS:  deferred  FUNCTIONAL TESTS:  5xSTS: 13.58sec w/o UE support 08/18/23 5xSTS: 11.95sec no UE support, mild L knee pain   GAIT: Distance walked: within clinic Assistive device utilized: None Level of assistance: Complete Independence Comments: widened BOS, reduced L knee ext in stance, reduced hip ext BIL, increased lateral weightshifting BIL and intermittent steadying on nearby objects                                                                                                                                TREATMENT DATE:  [facility] Adult PT Treatment:  DATE: 08/25/23 Vitals: HR  98-105bpm, SpO2 95-96% RA, BP 133/85 Therapeutic Exercise: Nu step L3, 4 min, LE only seat @ 10 Lateral stepping at counter 4 laps cues for posture and pacing Supine SLR BIL 2x8 cues for breath control and full extension Retro walking along mat 2x2 laps CGA HEP update + education    [facility] Adult PT Treatment:                                                DATE: 08/20/23 Therapeutic Exercise: Nu step L4 5 min LE only during subjective  Seated bosu TKE 2x10 BIL cues for quad contraction, breath control  STS 5# 3x5 lowest mat cues for pacing and trunk mechanics  Lateral stepping 4 laps along counter cues for pacing, foot positioning Green band hip abduction 2x8 cues for posture and control  Mini side lunge 2x5 BIL tactile cues at hips/knees for reduced lateral stress HEP education/discussion     [facility] Adult PT Treatment:                                                DATE: 08/18/23 Therapeutic Exercise: Nu step seat @ 10, LE only, L4; 5 min during subjective only  3# LAQ 3x6 LLE cues for pacing and quad contraction BW sit<>stand 3x5 (including 5xSTS) cues for mechanics and pacing, increased ascent velocity Red band hip abduction 3x12 BIL Standing march w/ UE support x10 BIL cues for posture Mini side lunge x5 BIL cues for comfortable ROM and knee mechanics HEP update + education/handout, rationale for interventions, relevant anatomy/physiology    PATIENT EDUCATION:  Education details: rationale for interventions, HEP  Person educated: Patient Education method: Explanation, Demonstration, Tactile cues, Verbal cues Education comprehension: verbalized understanding, returned demonstration, verbal cues required, tactile cues required, and needs further education     HOME EXERCISE PROGRAM: Access Code: [email or web address] URL: [email or web address] Date: 08/25/2023 Prepared by: Fransisco Hertz  Exercises - Sit to Stand with Armchair  - 2-3 x daily - 1 sets - 5 reps - Seated Quad  Set  - 2-3 x daily - 1 sets - 12 reps - Seated Long Arc Quad  - 2-3 x daily - 1 sets - 12 reps - Side Lunge with Counter Support  - 2-3 x daily - 1 sets - 5 reps - Side Stepping with Counter Support  - 2-3 x daily - 1 sets - 8 reps - Backward Walking with Counter Support  - 2-3 x daily - 1 sets - 8 reps  ASSESSMENT:  CLINICAL IMPRESSION: 08/25/2023 Pt arrives w/ 2/10 knee pain, no issues after last session. Pt states he woke up feeling a little dizzy but improving - he states this tends to happen every couple months and tends to self-resolve after a bit of time. Vitals grossly WNL and pt states he would like to proceed with PT (although he does request session shortened given preparation for upcoming travels). He tolerates session well with improvement in knee pain, reports resolution of dizziness as session goes on. Encouraged him to continue monitoring and communicate with provider as appropriate. No adverse events. Pt states he will call back to schedule additional visits once he is more sure of his return date. Pt  departs today's session in no acute distress, all voiced questions/concerns addressed appropriately from PT perspective.      Per eval - Patient is a very pleasant 73 y.o. gentleman who was seen today for physical therapy evaluation and treatment for bilateral knee pain, L>R. Symptoms ongoing for about a year and a half per pt and limiting tolerance to WB tasks such as walking and standing, although pt notes improvement since onset. On exam he demonstrates discrepancy between active and passive movement of both knees, reduced quad strength against gravity on L, and reduced rotational hip strength. 5xSTS around cutoff score for fall risk and reduced functional mobility (12-14sec varying by population). Pt tolerates exam/HEP well without increase in pain and no adverse events. Recommend trial of skilled PT to address aforementioned deficits with aim of improving functional tolerance and  reducing pain with typical activities. Pt departs today's session in no acute distress, all voiced concerns/questions addressed appropriately from PT perspective.    OBJECTIVE IMPAIRMENTS: Abnormal gait, decreased activity tolerance, decreased balance, decreased endurance, decreased mobility, difficulty walking, decreased ROM, decreased strength, improper body mechanics, and pain.   ACTIVITY LIMITATIONS: carrying, lifting, standing, squatting, transfers, and locomotion level  PARTICIPATION LIMITATIONS: meal prep, cleaning, laundry, and community activity  PERSONAL FACTORS: Age, Time since onset of injury/illness/exacerbation, and 3+ comorbidities: headaches, HTN, CKD  are also affecting patient's functional outcome.   REHAB POTENTIAL: Good  CLINICAL DECISION MAKING: Stable/uncomplicated  EVALUATION COMPLEXITY: Low   GOALS: Goals reviewed with patient? Yes  SHORT TERM GOALS: Target date: 09/01/2023 Pt will demonstrate appropriate understanding and performance of initially prescribed HEP in order to facilitate improved independence with management of symptoms.  Baseline: HEP provided on eval 08/25/23: reports good HEP adherence Goal status: MET  2. Pt will report at least 25% decrease in overall pain levels in past week in order to facilitate improved tolerance to basic ADLs/mobility.   Baseline: 0-8/10  08/25/23: 410 at worst   Goal status: MET  LONG TERM GOALS: Target date: 09/29/2023 Pt will score 62 or greater on FOTO in order to demonstrate improved perception of function due to symptoms.  Baseline: 60 Goal status: INITIAL  2.  Pt will demonstrate at least 0-120 degrees of knee AROM in order to facilitate improved tolerance to functional movements such as walking/squatting.  Baseline: see ROM chart above Goal status: INITIAL  3.  Pt will report/demonstrate ability to stand/walk up to 30 min w/ less than 3 pt increase in resting pain in order to facilitate improved tolerance to  daily tasks/mobility.  Baseline: difficulty tolerating >10-15 min standing/walking without increase in pain Goal status: INITIAL  4.  Pt will be able to perform 5xSTS in less than or equal to 10sec in order to demonstrate reduced fall risk and improved functional independence (MCID 5xSTS = 2.3 sec). Baseline: 13sec  Goal status: INITIAL   5. Pt will report at least 50% decrease in overall pain levels in past week in order to facilitate improved tolerance to basic ADLs/mobility.   Baseline: 0-8/10  Goal status: INITIAL     PLAN:  PT FREQUENCY: 2x/week  PT DURATION: 8 weeks  PLANNED INTERVENTIONS: 97164- PT Re-evaluation, 97110-Therapeutic exercises, 97530- Therapeutic activity, [id number]- Neuromuscular re-education, 97535- Self Care, [id number]- Manual therapy, [phone]- Gait training, [phone]- Aquatic Therapy, Patient/Family education, Balance training, Stair training, Taping, Dry Needling, Joint mobilization, Cryotherapy, and Moist heat  PLAN FOR NEXT SESSION: Review/update HEP PRN. Work on [employer] exercises as appropriate with emphasis on quad control/strength, hip rotational strength,  WB tolerance and functional mobility. Symptom modification strategies as indicated/appropriate.    Ashley Murrain PT, DPT 08/25/2023 7:55 AM

## 2023-09-08 ENCOUNTER — Encounter: Payer: Self-pay | Admitting: Physical Therapy

## 2023-09-08 ENCOUNTER — Ambulatory Visit: Payer: 59 | Attending: Sports Medicine | Admitting: Physical Therapy

## 2023-09-08 DIAGNOSIS — R2689 Other abnormalities of gait and mobility: Secondary | ICD-10-CM | POA: Diagnosis present

## 2023-09-08 DIAGNOSIS — G8929 Other chronic pain: Secondary | ICD-10-CM | POA: Diagnosis present

## 2023-09-08 DIAGNOSIS — M25561 Pain in right knee: Secondary | ICD-10-CM | POA: Diagnosis present

## 2023-09-08 DIAGNOSIS — M25562 Pain in left knee: Secondary | ICD-10-CM | POA: Diagnosis present

## 2023-09-08 NOTE — Therapy (Signed)
 OUTPATIENT PHYSICAL THERAPY TREATMENT   Patient Name: [name] MRN: [id number] DOB:[birth year], 72 y.o., male Today's Date: 09/08/2023  END OF SESSION:  PT End of Session - 09/08/23 0845     Visit Number 8    Number of Visits 17    Date for PT Re-Evaluation 09/29/23    Authorization Type [employer]    PT Start Time 0845    PT Stop Time 0923    PT Time Calculation (min) 38 min    Activity Tolerance Patient tolerated treatment well                   Past Medical History:  Diagnosis Date   Allergy    Arthritis    Cataract    MD just watching   CKD (chronic kidney disease) stage 3, GFR 30-59 ml/min (HCC) 06/05/2018   Cluster headache    resolved   Glaucoma    Hypertension    Substance abuse (HCC)    cocaine - quit 1998   Past Surgical History:  Procedure Laterality Date   back lumbar  2004   fusion    COLONOSCOPY  06/09/2009   Dr Lucius Sabins - normal   WISDOM TOOTH EXTRACTION     Patient Active Problem List   Diagnosis Date Noted   Chronic pain of left knee 02/21/2022   Erectile dysfunction 04/23/2021   IFG (impaired fasting glucose) 10/23/2020   OAB (overactive bladder) 10/20/2020   Anorexia 02/09/2020   Combined forms of age-related cataract of left eye 09/23/2019   Primary osteoarthritis of both knees 04/22/2019   Slurred speech 01/28/2019   Urinary urgency 01/28/2019   CKD stage G3a/A1, GFR 45-59 and albumin creatinine ratio <30 mg/g (HCC) 06/05/2018   Migraine 09/08/2013   Low back pain 09/08/2013   Hyperlipidemia 12/31/2010   GERD 11/08/2009   HYPERTENSION 04/22/2008    PCP: Cydney Draft, MD  REFERRING PROVIDER: Gean Keels, MD  REFERRING DIAG: M17.0 (ICD-10-CM) - Primary osteoarthritis of both knees  THERAPY DIAG:  Chronic pain of both knees  Other abnormalities of gait and mobility  Rationale for Evaluation and Treatment: Rehabilitation  ONSET DATE: about a year and a half  SUBJECTIVE:  Per eval - Pt reports  moving into new apartment about a year and a half ago, subsequent increase in BIL knee pain. Denies any MOI or trauma. States he has had injections in both knees, R knee essentially resolved at this point, L knee improved but not quite to the same degree as R. Most difficulty with WB tasks. Pt notes symptoms improve significantly with brace use. He reports sometimes feeling burning in knees but thinks injections have helped with that. Pt denies any N/T, swelling, or fevers/night sweats.  Pt states he used to be fairly active, travelling to flea markets. Wants to try to get back into the gym, interested in water aerobics.   SUBJECTIVE STATEMENT: 09/08/2023 Pt states his knee did well over his travels, bothering him some today. States he wasn't able to do HEP      PERTINENT HISTORY: headaches, HTN, CKD  PAIN:  Are you having pain: 5/10 Worst: 5/10    Per eval -  Location/description: lateral L knee, aching and sharp at times Best-worst over past week: 0-8/10  - aggravating factors: standing 10-15 min, walking 10-15 - Easing factors: pain cream, injections, brace    PRECAUTIONS: None  WEIGHT BEARING RESTRICTIONS: No  FALLS:  Has patient fallen in last 6 months? No  LIVING ENVIRONMENT: 2 level apartment w/ elevator, lives in assisted living facility Pt takes care of housework  OCCUPATION: retired Clinical research associate for [employer]  PLOF: Independent  PATIENT GOALS: getting back in gym, water aerobics, treadmill  NEXT MD VISIT: TBD per pt report   OBJECTIVE:  Note: Objective measures were completed at Evaluation unless otherwise noted.  DIAGNOSTIC FINDINGS:  No recent imaging in chart per EPIC review  PATIENT SURVEYS:  FOTO 60 > 62 FOTO 08/25/23: 59   COGNITION: Overall cognitive status: Within functional limits for tasks assessed     SENSATION: Light touch grossly symmetrical BIL LE  PALPATION: Concordant tenderness L lateral joint line, otherwise unremarkable overall.    LOWER EXTREMITY ROM:     Active  Right eval Left eval L 08/11/23 L 08/18/23  Hip flexion      Hip extension      Hip internal rotation      Hip external rotation      Knee extension A/P: 0 A: lacking 12 deg against gravity  P: full  A: lacking 8 degrees against gravity  A: lacking 4 deg against gravity   Knee flexion A: 112  P: 120 A: 110 deg *  P: 120 deg *  A: 118 deg * supine  (Blank rows = not tested) (Key: WFL = within functional limits not formally assessed, * = concordant pain, s = stiffness/stretching sensation, NT = not tested)  Comments:    LOWER EXTREMITY MMT:    MMT Right eval Left eval  Hip flexion 4 4  Hip abduction (modified sitting) 5 5  Hip internal rotation 4+ 4 *  Hip external rotation 4 + 4 *  Knee flexion 5 5  Knee extension 4+ 4+ (within available ROM)  Ankle dorsiflexion     (Blank rows = not tested) (Key: WFL = within functional limits not formally assessed, * = concordant pain, s = stiffness/stretching sensation, NT = not tested)  Comments:    LOWER EXTREMITY SPECIAL TESTS:  deferred  FUNCTIONAL TESTS:  5xSTS: 13.58sec w/o UE support 08/18/23 5xSTS: 11.95sec no UE support, mild L knee pain   GAIT: Distance walked: within clinic Assistive device utilized: None Level of assistance: Complete Independence Comments: widened BOS, reduced L knee ext in stance, reduced hip ext BIL, increased lateral weightshifting BIL and intermittent steadying on nearby objects                                                                                                                                TREATMENT DATE:  [facility] Adult PT Treatment:                                                DATE: 09/08/23 Therapeutic Exercise: Nu step 5 min during subjective Seated hamstring stretch w/ prop  3x30sec BIL Standing heel raise 3x12 w BIL UE  HEP discussion/review  Neuromuscular re-ed: BOSU TKE push 2x8 BIL cues for quad contraction Supine quad set x8 BIL  cues for quad contraction TKE standing ball at wall x5 BIL     [facility] Adult PT Treatment:                                                DATE: 08/25/23 Vitals: HR 98-105bpm, SpO2 95-96% RA, BP 133/85 Therapeutic Exercise: Nu step L3, 4 min, LE only seat @ 10 Lateral stepping at counter 4 laps cues for posture and pacing Supine SLR BIL 2x8 cues for breath control and full extension Retro walking along mat 2x2 laps CGA HEP update + education    [facility] Adult PT Treatment:                                                DATE: 08/20/23 Therapeutic Exercise: Nu step L4 5 min LE only during subjective  Seated bosu TKE 2x10 BIL cues for quad contraction, breath control  STS 5# 3x5 lowest mat cues for pacing and trunk mechanics  Lateral stepping 4 laps along counter cues for pacing, foot positioning Green band hip abduction 2x8 cues for posture and control  Mini side lunge 2x5 BIL tactile cues at hips/knees for reduced lateral stress HEP education/discussion     [facility] Adult PT Treatment:                                                DATE: 08/18/23 Therapeutic Exercise: Nu step seat @ 10, LE only, L4; 5 min during subjective only  3# LAQ 3x6 LLE cues for pacing and quad contraction BW sit<>stand 3x5 (including 5xSTS) cues for mechanics and pacing, increased ascent velocity Red band hip abduction 3x12 BIL Standing march w/ UE support x10 BIL cues for posture Mini side lunge x5 BIL cues for comfortable ROM and knee mechanics HEP update + education/handout, rationale for interventions, relevant anatomy/physiology    PATIENT EDUCATION:  Education details: rationale for interventions, HEP  Person educated: Patient Education method: Explanation, Demonstration, Tactile cues, Verbal cues Education comprehension: verbalized understanding, returned demonstration, verbal cues required, tactile cues required, and needs further education     HOME EXERCISE PROGRAM: Access Code: [email or web address] URL:  [email or web address] Date: 08/25/2023 Prepared by: Mayme Spearman  Exercises - Sit to Stand with Armchair  - 2-3 x daily - 1 sets - 5 reps - Seated Quad Set  - 2-3 x daily - 1 sets - 12 reps - Seated Long Arc Quad  - 2-3 x daily - 1 sets - 12 reps - Side Lunge with Counter Support  - 2-3 x daily - 1 sets - 5 reps - Side Stepping with Counter Support  - 2-3 x daily - 1 sets - 8 reps - Backward Walking with Counter Support  - 2-3 x daily - 1 sets - 8 reps  ASSESSMENT:  CLINICAL IMPRESSION: 09/08/2023 Pt arrives w/ 5/10 knee pain - states he did well over his travels but was not able to do HEP.  Given this, today we focus on reintroducing exercises focusing on open/closed chain knee strength/mobility, quad activation.Tolerates well with exertional fatigue, reports improving pain as session goes on, no adverse events. Does have fairly significant difficulty with TKE w ball at wall but reports resolution of pain afterwards, also requires increased rest breaks today. Recommend continuing along current POC in order to address relevant deficits and improve functional tolerance. Pt departs today's session in no acute distress, all voiced questions/concerns addressed appropriately from PT perspective.     Per eval - Patient is a very pleasant 73 y.o. gentleman who was seen today for physical therapy evaluation and treatment for bilateral knee pain, L>R. Symptoms ongoing for about a year and a half per pt and limiting tolerance to WB tasks such as walking and standing, although pt notes improvement since onset. On exam he demonstrates discrepancy between active and passive movement of both knees, reduced quad strength against gravity on L, and reduced rotational hip strength. 5xSTS around cutoff score for fall risk and reduced functional mobility (12-14sec varying by population). Pt tolerates exam/HEP well without increase in pain and no adverse events. Recommend trial of skilled PT to address  aforementioned deficits with aim of improving functional tolerance and reducing pain with typical activities. Pt departs today's session in no acute distress, all voiced concerns/questions addressed appropriately from PT perspective.    OBJECTIVE IMPAIRMENTS: Abnormal gait, decreased activity tolerance, decreased balance, decreased endurance, decreased mobility, difficulty walking, decreased ROM, decreased strength, improper body mechanics, and pain.   ACTIVITY LIMITATIONS: carrying, lifting, standing, squatting, transfers, and locomotion level  PARTICIPATION LIMITATIONS: meal prep, cleaning, laundry, and community activity  PERSONAL FACTORS: Age, Time since onset of injury/illness/exacerbation, and 3+ comorbidities: headaches, HTN, CKD  are also affecting patient's functional outcome.   REHAB POTENTIAL: Good  CLINICAL DECISION MAKING: Stable/uncomplicated  EVALUATION COMPLEXITY: Low   GOALS: Goals reviewed with patient? Yes  SHORT TERM GOALS: Target date: 09/01/2023 Pt will demonstrate appropriate understanding and performance of initially prescribed HEP in order to facilitate improved independence with management of symptoms.  Baseline: HEP provided on eval 08/25/23: reports good HEP adherence Goal status: MET  2. Pt will report at least 25% decrease in overall pain levels in past week in order to facilitate improved tolerance to basic ADLs/mobility.   Baseline: 0-8/10  08/25/23: 410 at worst   Goal status: MET  LONG TERM GOALS: Target date: 09/29/2023 Pt will score 62 or greater on FOTO in order to demonstrate improved perception of function due to symptoms.  Baseline: 60 Goal status: INITIAL  2.  Pt will demonstrate at least 0-120 degrees of knee AROM in order to facilitate improved tolerance to functional movements such as walking/squatting.  Baseline: see ROM chart above Goal status: INITIAL  3.  Pt will report/demonstrate ability to stand/walk up to 30 min w/ less than 3 pt  increase in resting pain in order to facilitate improved tolerance to daily tasks/mobility.  Baseline: difficulty tolerating >10-15 min standing/walking without increase in pain Goal status: INITIAL  4.  Pt will be able to perform 5xSTS in less than or equal to 10sec in order to demonstrate reduced fall risk and improved functional independence (MCID 5xSTS = 2.3 sec). Baseline: 13sec  Goal status: INITIAL   5. Pt will report at least 50% decrease in overall pain levels in past week in order to facilitate improved tolerance to basic ADLs/mobility.   Baseline: 0-8/10  Goal status: INITIAL     PLAN:  PT FREQUENCY: 2x/week  PT DURATION: 8 weeks  PLANNED INTERVENTIONS: 97164- PT Re-evaluation, 97110-Therapeutic exercises, 97530- Therapeutic activity, [id number]- Neuromuscular re-education, 97535- Self Care, 16109- Manual therapy, [phone]- Gait training, [phone]- Aquatic Therapy, Patient/Family education, Balance training, Stair training, Taping, Dry Needling, Joint mobilization, Cryotherapy, and Moist heat  PLAN FOR NEXT SESSION: Review/update HEP PRN. Work on [employer] exercises as appropriate with emphasis on quad control/strength, hip rotational strength, WB tolerance and functional mobility. Symptom modification strategies as indicated/appropriate.    Lovett Ruck PT, DPT 09/08/2023 9:24 AM

## 2023-09-09 NOTE — Therapy (Signed)
OUTPATIENT PHYSICAL THERAPY TREATMENT   Patient Name: Jose Waller MRN: [id number] DOB:05-30-1951, 73 y.o., male Today's Date: 09/10/2023  END OF SESSION:  PT End of Session - 09/10/23 0758     Visit Number 9    Number of Visits 17    Date for PT Re-Evaluation 09/29/23    Authorization Type [employer]    PT Start Time 0800    PT Stop Time 0840    PT Time Calculation (min) 40 min    Activity Tolerance Patient tolerated treatment well                    Past Medical History:  Diagnosis Date   Allergy    Arthritis    Cataract    MD just watching   CKD (chronic kidney disease) stage 3, GFR 30-59 ml/min (HCC) 06/05/2018   Cluster headache    resolved   Glaucoma    Hypertension    Substance abuse (HCC)    cocaine - quit 1998   Past Surgical History:  Procedure Laterality Date   back lumbar  2004   fusion    COLONOSCOPY  06/09/2009   Dr Mellody Memos - normal   WISDOM TOOTH EXTRACTION     Patient Active Problem List   Diagnosis Date Noted   Chronic pain of left knee 02/21/2022   Erectile dysfunction 04/23/2021   IFG (impaired fasting glucose) 10/23/2020   OAB (overactive bladder) 10/20/2020   Anorexia 02/09/2020   Combined forms of age-related cataract of left eye 09/23/2019   Primary osteoarthritis of both knees 04/22/2019   Slurred speech 01/28/2019   Urinary urgency 01/28/2019   CKD stage G3a/A1, GFR 45-59 and albumin creatinine ratio <30 mg/g (HCC) 06/05/2018   Migraine 09/08/2013   Low back pain 09/08/2013   Hyperlipidemia 12/31/2010   GERD 11/08/2009   HYPERTENSION 04/22/2008    PCP: Agapito Games, MD  REFERRING PROVIDER: Monica Becton, MD  REFERRING DIAG: M17.0 (ICD-10-CM) - Primary osteoarthritis of both knees  THERAPY DIAG:  Chronic pain of both knees  Other abnormalities of gait and mobility  Rationale for Evaluation and Treatment: Rehabilitation  ONSET DATE: about a year and a half  SUBJECTIVE:  Per eval - Pt reports  moving into new apartment about a year and a half ago, subsequent increase in BIL knee pain. Denies any MOI or trauma. States he has had injections in both knees, R knee essentially resolved at this point, L knee improved but not quite to the same degree as R. Most difficulty with WB tasks. Pt notes symptoms improve significantly with brace use. He reports sometimes feeling burning in knees but thinks injections have helped with that. Pt denies any N/T, swelling, or fevers/night sweats.  Pt states he used to be fairly active, travelling to flea markets. Wants to try to get back into the gym, interested in water aerobics.   SUBJECTIVE STATEMENT: 09/10/2023 Felt good after last session, continues to report relief after PT sessions. No pain this morning, just some soreness. States he has been trying to walk more.    PERTINENT HISTORY: headaches, HTN, CKD  PAIN:  Are you having pain: soreness, no pain Worst: 5/10    Per eval -  Location/description: lateral L knee, aching and sharp at times Best-worst over past week: 0-8/10  - aggravating factors: standing 10-15 min, walking 10-15 - Easing factors: pain cream, injections, brace    PRECAUTIONS: None  WEIGHT BEARING RESTRICTIONS: No  FALLS:  Has  patient fallen in last 6 months? No  LIVING ENVIRONMENT: 2 level apartment w/ elevator, lives in assisted living facility Pt takes care of housework  OCCUPATION: retired Clinical research associate for [employer]  PLOF: Independent  PATIENT GOALS: getting back in gym, water aerobics, treadmill  NEXT MD VISIT: TBD per pt report   OBJECTIVE:  Note: Objective measures were completed at Evaluation unless otherwise noted.  DIAGNOSTIC FINDINGS:  No recent imaging in chart per EPIC review  PATIENT SURVEYS:  FOTO 60 > 62 FOTO 08/25/23: 59   COGNITION: Overall cognitive status: Within functional limits for tasks assessed     SENSATION: Light touch grossly symmetrical BIL LE  PALPATION: Concordant  tenderness L lateral joint line, otherwise unremarkable overall.   LOWER EXTREMITY ROM:     Active  Right eval Left eval L 08/11/23 L 08/18/23 L 09/10/23  Hip flexion       Hip extension       Hip internal rotation       Hip external rotation       Knee extension A/P: 0 A: lacking 12 deg against gravity  P: full  A: lacking 8 degrees against gravity  A: lacking 4 deg against gravity  A: lacking 4-5 deg against gravity  Knee flexion A: 112  P: 120 A: 110 deg *  P: 120 deg *  A: 118 deg * supine A: 124 deg painless   (Blank rows = not tested) (Key: WFL = within functional limits not formally assessed, * = concordant pain, s = stiffness/stretching sensation, NT = not tested)  Comments:    LOWER EXTREMITY MMT:    MMT Right eval Left eval  Hip flexion 4 4  Hip abduction (modified sitting) 5 5  Hip internal rotation 4+ 4 *  Hip external rotation 4 + 4 *  Knee flexion 5 5  Knee extension 4+ 4+ (within available ROM)  Ankle dorsiflexion     (Blank rows = not tested) (Key: WFL = within functional limits not formally assessed, * = concordant pain, s = stiffness/stretching sensation, NT = not tested)  Comments:    LOWER EXTREMITY SPECIAL TESTS:  deferred  FUNCTIONAL TESTS:  5xSTS: 13.58sec w/o UE support 08/18/23 5xSTS: 11.95sec no UE support, mild L knee pain   GAIT: Distance walked: within clinic Assistive device utilized: None Level of assistance: Complete Independence Comments: widened BOS, reduced L knee ext in stance, reduced hip ext BIL, increased lateral weightshifting BIL and intermittent steadying on nearby objects                                                                                                                                TREATMENT DATE:  [facility] Adult PT Treatment:  DATE: 09/10/23 Therapeutic Exercise: Nu step L3 5 min during subjective  Hamstring stretch on prop 3x30sec BIL Red band hamstring curl  2x10 BIL cues for control  HEP update + education/handout  Neuromuscular re-ed: Propped quad set 2x12 BIL cues for reduced hip compensations Namastegg and yoga block step overs 2x2 laps CGA cues for stability and posture    [facility] Adult PT Treatment:                                                DATE: 09/08/23 Therapeutic Exercise: Nu step 5 min during subjective Seated hamstring stretch w/ prop 3x30sec BIL Standing heel raise 3x12 w BIL UE  HEP discussion/review  Neuromuscular re-ed: BOSU TKE push 2x8 BIL cues for quad contraction Supine quad set x8 BIL cues for quad contraction TKE standing ball at wall x5 BIL     [facility] Adult PT Treatment:                                                DATE: 08/25/23 Vitals: HR 98-105bpm, SpO2 95-96% RA, BP 133/85 Therapeutic Exercise: Nu step L3, 4 min, LE only seat @ 10 Lateral stepping at counter 4 laps cues for posture and pacing Supine SLR BIL 2x8 cues for breath control and full extension Retro walking along mat 2x2 laps CGA HEP update + education    PATIENT EDUCATION:  Education details: rationale for interventions, HEP  Person educated: Patient Education method: Explanation, Demonstration, Tactile cues, Verbal cues Education comprehension: verbalized understanding, returned demonstration, verbal cues required, tactile cues required, and needs further education     HOME EXERCISE PROGRAM: Access Code: [email or web address] URL: [email or web address] Date: 09/10/2023 Prepared by: Fransisco Hertz  Exercises - Sit to Stand with Armchair  - 2-3 x daily - 1 sets - 5 reps - Seated Quad Set  - 2-3 x daily - 1 sets - 12 reps - Seated Long Arc Quad  - 2-3 x daily - 1 sets - 12 reps - Side Stepping with Counter Support  - 2-3 x daily - 1 sets - 8 reps - Backward Walking with Counter Support  - 2-3 x daily - 1 sets - 8 reps - Seated Hamstring Curl with Anchored Resistance  - 2-3 x daily - 1 sets - 8 reps  ASSESSMENT:  CLINICAL  IMPRESSION: 09/10/2023 Pt arrives w/o pain, does endorse some soreness but states he continues to get good relief after PT sessions. Today continuing to build volume with quad work, progressing for increased hamstring strengthening which is added to HEP, education on safe home setup. Also adding in more postural stability work which pt does well with. Continues to require rest breaks to mitigate exertional fatigue and compensations. No adverse events, tolerates well and endorses no pain on departure. Recommend continuing along current POC in order to address relevant deficits and improve functional tolerance. Pt departs today's session in no acute distress, all voiced questions/concerns addressed appropriately from PT perspective.     Per eval - Patient is a very pleasant 73 y.o. gentleman who was seen today for physical therapy evaluation and treatment for bilateral knee pain, L>R. Symptoms ongoing for about a year and a half per pt and limiting tolerance to WB tasks  such as walking and standing, although pt notes improvement since onset. On exam he demonstrates discrepancy between active and passive movement of both knees, reduced quad strength against gravity on L, and reduced rotational hip strength. 5xSTS around cutoff score for fall risk and reduced functional mobility (12-14sec varying by population). Pt tolerates exam/HEP well without increase in pain and no adverse events. Recommend trial of skilled PT to address aforementioned deficits with aim of improving functional tolerance and reducing pain with typical activities. Pt departs today's session in no acute distress, all voiced concerns/questions addressed appropriately from PT perspective.    OBJECTIVE IMPAIRMENTS: Abnormal gait, decreased activity tolerance, decreased balance, decreased endurance, decreased mobility, difficulty walking, decreased ROM, decreased strength, improper body mechanics, and pain.   ACTIVITY LIMITATIONS: carrying,  lifting, standing, squatting, transfers, and locomotion level  PARTICIPATION LIMITATIONS: meal prep, cleaning, laundry, and community activity  PERSONAL FACTORS: Age, Time since onset of injury/illness/exacerbation, and 3+ comorbidities: headaches, HTN, CKD  are also affecting patient's functional outcome.   REHAB POTENTIAL: Good  CLINICAL DECISION MAKING: Stable/uncomplicated  EVALUATION COMPLEXITY: Low   GOALS: Goals reviewed with patient? Yes  SHORT TERM GOALS: Target date: 09/01/2023 Pt will demonstrate appropriate understanding and performance of initially prescribed HEP in order to facilitate improved independence with management of symptoms.  Baseline: HEP provided on eval 08/25/23: reports good HEP adherence Goal status: MET  2. Pt will report at least 25% decrease in overall pain levels in past week in order to facilitate improved tolerance to basic ADLs/mobility.   Baseline: 0-8/10  08/25/23: 410 at worst   Goal status: MET  LONG TERM GOALS: Target date: 09/29/2023 Pt will score 62 or greater on FOTO in order to demonstrate improved perception of function due to symptoms.  Baseline: 60 Goal status: INITIAL  2.  Pt will demonstrate at least 0-120 degrees of knee AROM in order to facilitate improved tolerance to functional movements such as walking/squatting.  Baseline: see ROM chart above Goal status: INITIAL  3.  Pt will report/demonstrate ability to stand/walk up to 30 min w/ less than 3 pt increase in resting pain in order to facilitate improved tolerance to daily tasks/mobility.  Baseline: difficulty tolerating >10-15 min standing/walking without increase in pain Goal status: INITIAL  4.  Pt will be able to perform 5xSTS in less than or equal to 10sec in order to demonstrate reduced fall risk and improved functional independence (MCID 5xSTS = 2.3 sec). Baseline: 13sec  Goal status: INITIAL   5. Pt will report at least 50% decrease in overall pain levels in past week  in order to facilitate improved tolerance to basic ADLs/mobility.   Baseline: 0-8/10  Goal status: INITIAL     PLAN:  PT FREQUENCY: 2x/week  PT DURATION: 8 weeks  PLANNED INTERVENTIONS: 97164- PT Re-evaluation, 97110-Therapeutic exercises, 97530- Therapeutic activity, [id number]- Neuromuscular re-education, 97535- Self Care, 16109- Manual therapy, [phone]- Gait training, [phone]- Aquatic Therapy, Patient/Family education, Balance training, Stair training, Taping, Dry Needling, Joint mobilization, Cryotherapy, and Moist heat  PLAN FOR NEXT SESSION: Review/update HEP PRN. Work on [employer] exercises as appropriate with emphasis on quad control/strength, hip rotational strength, WB tolerance and functional mobility. Symptom modification strategies as indicated/appropriate.    Ashley Murrain PT, DPT 09/10/2023 8:41 AM

## 2023-09-10 ENCOUNTER — Ambulatory Visit: Payer: 59 | Admitting: Physical Therapy

## 2023-09-10 ENCOUNTER — Encounter: Payer: Self-pay | Admitting: Physical Therapy

## 2023-09-10 DIAGNOSIS — R2689 Other abnormalities of gait and mobility: Secondary | ICD-10-CM | POA: Diagnosis not present

## 2023-09-10 DIAGNOSIS — M25562 Pain in left knee: Secondary | ICD-10-CM | POA: Diagnosis not present

## 2023-09-10 DIAGNOSIS — G8929 Other chronic pain: Secondary | ICD-10-CM

## 2023-09-10 DIAGNOSIS — M25561 Pain in right knee: Secondary | ICD-10-CM | POA: Diagnosis not present

## 2023-09-15 ENCOUNTER — Ambulatory Visit: Payer: 59 | Admitting: Physical Therapy

## 2023-09-15 ENCOUNTER — Encounter: Payer: Self-pay | Admitting: Physical Therapy

## 2023-09-15 DIAGNOSIS — G8929 Other chronic pain: Secondary | ICD-10-CM

## 2023-09-15 DIAGNOSIS — R2689 Other abnormalities of gait and mobility: Secondary | ICD-10-CM | POA: Diagnosis not present

## 2023-09-15 DIAGNOSIS — M25561 Pain in right knee: Secondary | ICD-10-CM | POA: Diagnosis not present

## 2023-09-15 DIAGNOSIS — M25562 Pain in left knee: Secondary | ICD-10-CM | POA: Diagnosis not present

## 2023-09-15 NOTE — Therapy (Signed)
OUTPATIENT PHYSICAL THERAPY TREATMENT   Patient Name: Jose Waller MRN: [id number] DOB:September 08, 1950, 73 y.o., male Today's Date: 09/15/2023  END OF SESSION:  PT End of Session - 09/15/23 0839     Visit Number 10    Number of Visits 17    Date for PT Re-Evaluation 09/29/23    Authorization Type [employer]    PT Start Time 0840    PT Stop Time 0918    PT Time Calculation (min) 38 min    Activity Tolerance Patient tolerated treatment well              Past Medical History:  Diagnosis Date   Allergy    Arthritis    Cataract    MD just watching   CKD (chronic kidney disease) stage 3, GFR 30-59 ml/min (HCC) 06/05/2018   Cluster headache    resolved   Glaucoma    Hypertension    Substance abuse (HCC)    cocaine - quit 1998   Past Surgical History:  Procedure Laterality Date   back lumbar  2004   fusion    COLONOSCOPY  06/09/2009   Dr Mellody Memos - normal   WISDOM TOOTH EXTRACTION     Patient Active Problem List   Diagnosis Date Noted   Chronic pain of left knee 02/21/2022   Erectile dysfunction 04/23/2021   IFG (impaired fasting glucose) 10/23/2020   OAB (overactive bladder) 10/20/2020   Anorexia 02/09/2020   Combined forms of age-related cataract of left eye 09/23/2019   Primary osteoarthritis of both knees 04/22/2019   Slurred speech 01/28/2019   Urinary urgency 01/28/2019   CKD stage G3a/A1, GFR 45-59 and albumin creatinine ratio <30 mg/g (HCC) 06/05/2018   Migraine 09/08/2013   Low back pain 09/08/2013   Hyperlipidemia 12/31/2010   GERD 11/08/2009   HYPERTENSION 04/22/2008    PCP: Agapito Games, MD  REFERRING PROVIDER: Monica Becton, MD  REFERRING DIAG: M17.0 (ICD-10-CM) - Primary osteoarthritis of both knees  THERAPY DIAG:  Chronic pain of both knees  Other abnormalities of gait and mobility  Rationale for Evaluation and Treatment: Rehabilitation  ONSET DATE: about a year and a half  SUBJECTIVE:  Per eval - Pt reports moving into  new apartment about a year and a half ago, subsequent increase in BIL knee pain. Denies any MOI or trauma. States he has had injections in both knees, R knee essentially resolved at this point, L knee improved but not quite to the same degree as R. Most difficulty with WB tasks. Pt notes symptoms improve significantly with brace use. He reports sometimes feeling burning in knees but thinks injections have helped with that. Pt denies any N/T, swelling, or fevers/night sweats.  Pt states he used to be fairly active, travelling to flea markets. Wants to try to get back into the gym, interested in water aerobics.   SUBJECTIVE STATEMENT: 09/15/2023 Pt reports 2/10 pain at present which is the worst its been since last session. Not using brace as much. Still feels he is improving.    PERTINENT HISTORY: headaches, HTN, CKD  PAIN:  Are you having pain: 2/10 Worst: 2/10    Per eval -  Location/description: lateral L knee, aching and sharp at times Best-worst over past week: 0-8/10  - aggravating factors: standing 10-15 min, walking 10-15 - Easing factors: pain cream, injections, brace    PRECAUTIONS: None  WEIGHT BEARING RESTRICTIONS: No  FALLS:  Has patient fallen in last 6 months? No  LIVING ENVIRONMENT:  2 level apartment w/ elevator, lives in assisted living facility Pt takes care of housework  OCCUPATION: retired Clinical research associate for [employer]  PLOF: Independent  PATIENT GOALS: getting back in gym, water aerobics, treadmill  NEXT MD VISIT: TBD per pt report   OBJECTIVE:  Note: Objective measures were completed at Evaluation unless otherwise noted.  DIAGNOSTIC FINDINGS:  No recent imaging in chart per EPIC review  PATIENT SURVEYS:  FOTO 60 > 62 FOTO 08/25/23: 59   COGNITION: Overall cognitive status: Within functional limits for tasks assessed     SENSATION: Light touch grossly symmetrical BIL LE  PALPATION: Concordant tenderness L lateral joint line, otherwise unremarkable  overall.   LOWER EXTREMITY ROM:     Active  Right eval Left eval L 08/11/23 L 08/18/23 L 09/10/23  Hip flexion       Hip extension       Hip internal rotation       Hip external rotation       Knee extension A/P: 0 A: lacking 12 deg against gravity  P: full  A: lacking 8 degrees against gravity  A: lacking 4 deg against gravity  A: lacking 4-5 deg against gravity  Knee flexion A: 112  P: 120 A: 110 deg *  P: 120 deg *  A: 118 deg * supine A: 124 deg painless   (Blank rows = not tested) (Key: WFL = within functional limits not formally assessed, * = concordant pain, s = stiffness/stretching sensation, NT = not tested)  Comments:    LOWER EXTREMITY MMT:    MMT Right eval Left eval  Hip flexion 4 4  Hip abduction (modified sitting) 5 5  Hip internal rotation 4+ 4 *  Hip external rotation 4 + 4 *  Knee flexion 5 5  Knee extension 4+ 4+ (within available ROM)  Ankle dorsiflexion     (Blank rows = not tested) (Key: WFL = within functional limits not formally assessed, * = concordant pain, s = stiffness/stretching sensation, NT = not tested)  Comments:    LOWER EXTREMITY SPECIAL TESTS:  deferred  FUNCTIONAL TESTS:  5xSTS: 13.58sec w/o UE support 08/18/23 5xSTS: 11.95sec no UE support, mild L knee pain   GAIT: Distance walked: within clinic Assistive device utilized: None Level of assistance: Complete Independence Comments: widened BOS, reduced L knee ext in stance, reduced hip ext BIL, increased lateral weightshifting BIL and intermittent steadying on nearby objects                                                                                                                                TREATMENT DATE:  [facility] Adult PT Treatment:  DATE: 09/15/23 Vitals: HR upper 90s, SpO2 96-97% RA, BP 130/90 Therapeutic Exercise: Green band hamstring curl 2x8 BIL Red band LAQ 2x10 BIL Green band TKE 2x5 BIL HEP update + education,  relevant anatomy/physiology and rationale for interventions  Therapeutic Activity: Laps around gym during subjective, 3 laps, 2 laps with cues for weight shifting mechanics, posture STS 3x5 from lowest mat cues for symmetrical WB and pacing   [facility] Adult PT Treatment:                                                DATE: 09/10/23 Therapeutic Exercise: Nu step L3 5 min during subjective  Hamstring stretch on prop 3x30sec BIL Red band hamstring curl 2x10 BIL cues for control  HEP update + education/handout  Neuromuscular re-ed: Propped quad set 2x12 BIL cues for reduced hip compensations [place]gg and yoga block step overs 2x2 laps CGA cues for stability and posture    [facility] Adult PT Treatment:                                                DATE: 09/08/23 Therapeutic Exercise: Nu step 5 min during subjective Seated hamstring stretch w/ prop 3x30sec BIL Standing heel raise 3x12 w BIL UE  HEP discussion/review  Neuromuscular re-ed: BOSU TKE push 2x8 BIL cues for quad contraction Supine quad set x8 BIL cues for quad contraction TKE standing ball at wall x5 BIL     PATIENT EDUCATION:  Education details: rationale for interventions, HEP  Person educated: Patient Education method: Explanation, Demonstration, Tactile cues, Verbal cues Education comprehension: verbalized understanding, returned demonstration, verbal cues required, tactile cues required, and needs further education     HOME EXERCISE PROGRAM: Access Code: [email or web address] URL: [email or web address] Date: 09/10/2023 Prepared by: Fransisco Hertz  Exercises - Sit to Stand with Armchair  - 2-3 x daily - 1 sets - 5 reps - Seated Quad Set  - 2-3 x daily - 1 sets - 12 reps - Seated Long Arc Quad  - 2-3 x daily - 1 sets - 12 reps - Side Stepping with Counter Support  - 2-3 x daily - 1 sets - 8 reps - Backward Walking with Counter Support  - 2-3 x daily - 1 sets - 8 reps - Seated Hamstring Curl with Anchored Resistance   - 2-3 x daily - 1 sets - 8 reps  ASSESSMENT:  CLINICAL IMPRESSION: 09/15/2023 Pt arrives w/ mild pain, continues to endorse progress. Pt endorses some vague dizziness again today - vitals remain WNL and in discussion with pt he states this has been chronic and intermittent, tends to occur more when he is generally fatigued. Improves as session goes on and tolerates session well, reports resolution of pain on departure. Also reports resolution of dizziness. No adverse events. Recommend continuing along current POC in order to address relevant deficits and improve functional tolerance. Pt departs today's session in no acute distress, all voiced questions/concerns addressed appropriately from PT perspective.     Per eval - Patient is a very pleasant 73 y.o. gentleman who was seen today for physical therapy evaluation and treatment for bilateral knee pain, L>R. Symptoms ongoing for about a year and a half per pt and limiting tolerance to  WB tasks such as walking and standing, although pt notes improvement since onset. On exam he demonstrates discrepancy between active and passive movement of both knees, reduced quad strength against gravity on L, and reduced rotational hip strength. 5xSTS around cutoff score for fall risk and reduced functional mobility (12-14sec varying by population). Pt tolerates exam/HEP well without increase in pain and no adverse events. Recommend trial of skilled PT to address aforementioned deficits with aim of improving functional tolerance and reducing pain with typical activities. Pt departs today's session in no acute distress, all voiced concerns/questions addressed appropriately from PT perspective.    OBJECTIVE IMPAIRMENTS: Abnormal gait, decreased activity tolerance, decreased balance, decreased endurance, decreased mobility, difficulty walking, decreased ROM, decreased strength, improper body mechanics, and pain.   ACTIVITY LIMITATIONS: carrying, lifting, standing, squatting,  transfers, and locomotion level  PARTICIPATION LIMITATIONS: meal prep, cleaning, laundry, and community activity  PERSONAL FACTORS: Age, Time since onset of injury/illness/exacerbation, and 3+ comorbidities: headaches, HTN, CKD  are also affecting patient's functional outcome.   REHAB POTENTIAL: Good  CLINICAL DECISION MAKING: Stable/uncomplicated  EVALUATION COMPLEXITY: Low   GOALS: Goals reviewed with patient? Yes  SHORT TERM GOALS: Target date: 09/01/2023 Pt will demonstrate appropriate understanding and performance of initially prescribed HEP in order to facilitate improved independence with management of symptoms.  Baseline: HEP provided on eval 08/25/23: reports good HEP adherence Goal status: MET  2. Pt will report at least 25% decrease in overall pain levels in past week in order to facilitate improved tolerance to basic ADLs/mobility.   Baseline: 0-8/10  08/25/23: 410 at worst   Goal status: MET  LONG TERM GOALS: Target date: 09/29/2023 Pt will score 62 or greater on FOTO in order to demonstrate improved perception of function due to symptoms.  Baseline: 60 Goal status: INITIAL  2.  Pt will demonstrate at least 0-120 degrees of knee AROM in order to facilitate improved tolerance to functional movements such as walking/squatting.  Baseline: see ROM chart above Goal status: INITIAL  3.  Pt will report/demonstrate ability to stand/walk up to 30 min w/ less than 3 pt increase in resting pain in order to facilitate improved tolerance to daily tasks/mobility.  Baseline: difficulty tolerating >10-15 min standing/walking without increase in pain Goal status: INITIAL  4.  Pt will be able to perform 5xSTS in less than or equal to 10sec in order to demonstrate reduced fall risk and improved functional independence (MCID 5xSTS = 2.3 sec). Baseline: 13sec  Goal status: INITIAL   5. Pt will report at least 50% decrease in overall pain levels in past week in order to facilitate  improved tolerance to basic ADLs/mobility.   Baseline: 0-8/10  Goal status: INITIAL     PLAN:  PT FREQUENCY: 2x/week  PT DURATION: 8 weeks  PLANNED INTERVENTIONS: 97164- PT Re-evaluation, 97110-Therapeutic exercises, 97530- Therapeutic activity, [id number]- Neuromuscular re-education, 97535- Self Care, [id number]- Manual therapy, [phone]- Gait training, [phone]- Aquatic Therapy, Patient/Family education, Balance training, Stair training, Taping, Dry Needling, Joint mobilization, Cryotherapy, and Moist heat  PLAN FOR NEXT SESSION: Review/update HEP PRN. Work on [employer] exercises as appropriate with emphasis on quad control/strength, hip rotational strength, WB tolerance and functional mobility. Symptom modification strategies as indicated/appropriate.    Ashley Murrain PT, DPT 09/15/2023 9:23 AM

## 2023-09-16 NOTE — Therapy (Signed)
OUTPATIENT PHYSICAL THERAPY TREATMENT   Patient Name: Jose Waller MRN: [id number] DOB:06-Mar-1951, 73 y.o., male Today's Date: 09/17/2023  END OF SESSION:  PT End of Session - 09/17/23 1015     Visit Number 11    Number of Visits 17    Date for PT Re-Evaluation 09/29/23    Authorization Type [employer]    PT Start Time 1015    PT Stop Time 1054    PT Time Calculation (min) 39 min    Activity Tolerance Patient tolerated treatment well               Past Medical History:  Diagnosis Date   Allergy    Arthritis    Cataract    MD just watching   CKD (chronic kidney disease) stage 3, GFR 30-59 ml/min (HCC) 06/05/2018   Cluster headache    resolved   Glaucoma    Hypertension    Substance abuse (HCC)    cocaine - quit 1998   Past Surgical History:  Procedure Laterality Date   back lumbar  2004   fusion    COLONOSCOPY  06/09/2009   Dr Mellody Memos - normal   WISDOM TOOTH EXTRACTION     Patient Active Problem List   Diagnosis Date Noted   Chronic pain of left knee 02/21/2022   Erectile dysfunction 04/23/2021   IFG (impaired fasting glucose) 10/23/2020   OAB (overactive bladder) 10/20/2020   Anorexia 02/09/2020   Combined forms of age-related cataract of left eye 09/23/2019   Primary osteoarthritis of both knees 04/22/2019   Slurred speech 01/28/2019   Urinary urgency 01/28/2019   CKD stage G3a/A1, GFR 45-59 and albumin creatinine ratio <30 mg/g (HCC) 06/05/2018   Migraine 09/08/2013   Low back pain 09/08/2013   Hyperlipidemia 12/31/2010   GERD 11/08/2009   HYPERTENSION 04/22/2008    PCP: Agapito Games, MD  REFERRING PROVIDER: Monica Becton, MD  REFERRING DIAG: M17.0 (ICD-10-CM) - Primary osteoarthritis of both knees  THERAPY DIAG:  Chronic pain of both knees  Other abnormalities of gait and mobility  Rationale for Evaluation and Treatment: Rehabilitation  ONSET DATE: about a year and a half  SUBJECTIVE:  Per eval - Pt reports moving  into new apartment about a year and a half ago, subsequent increase in BIL knee pain. Denies any MOI or trauma. States he has had injections in both knees, R knee essentially resolved at this point, L knee improved but not quite to the same degree as R. Most difficulty with WB tasks. Pt notes symptoms improve significantly with brace use. He reports sometimes feeling burning in knees but thinks injections have helped with that. Pt denies any N/T, swelling, or fevers/night sweats.  Pt states he used to be fairly active, travelling to flea markets. Wants to try to get back into the gym, interested in water aerobics.   SUBJECTIVE STATEMENT: 09/17/2023 Pt states his knee has been bothering him more the past couple days but not bothering him right now. States even with a rougher couple of days things are still better than before.   PERTINENT HISTORY: headaches, HTN, CKD  PAIN:  Are you having pain: 0/10 Worst: 4/10    Per eval -  Location/description: lateral L knee, aching and sharp at times Best-worst over past week: 0-8/10  - aggravating factors: standing 10-15 min, walking 10-15 - Easing factors: pain cream, injections, brace    PRECAUTIONS: None  WEIGHT BEARING RESTRICTIONS: No  FALLS:  Has patient fallen  in last 6 months? No  LIVING ENVIRONMENT: 2 level apartment w/ elevator, lives in assisted living facility Pt takes care of housework  OCCUPATION: retired Clinical research associate for [employer]  PLOF: Independent  PATIENT GOALS: getting back in gym, water aerobics, treadmill  NEXT MD VISIT: TBD per pt report   OBJECTIVE:  Note: Objective measures were completed at Evaluation unless otherwise noted.  DIAGNOSTIC FINDINGS:  No recent imaging in chart per EPIC review  PATIENT SURVEYS:  FOTO 60 > 62 FOTO 08/25/23: 59   COGNITION: Overall cognitive status: Within functional limits for tasks assessed     SENSATION: Light touch grossly symmetrical BIL LE  PALPATION: Concordant  tenderness L lateral joint line, otherwise unremarkable overall.   LOWER EXTREMITY ROM:     Active  Right eval Left eval L 08/11/23 L 08/18/23 L 09/10/23  Hip flexion       Hip extension       Hip internal rotation       Hip external rotation       Knee extension A/P: 0 A: lacking 12 deg against gravity  P: full  A: lacking 8 degrees against gravity  A: lacking 4 deg against gravity  A: lacking 4-5 deg against gravity  Knee flexion A: 112  P: 120 A: 110 deg *  P: 120 deg *  A: 118 deg * supine A: 124 deg painless   (Blank rows = not tested) (Key: WFL = within functional limits not formally assessed, * = concordant pain, s = stiffness/stretching sensation, NT = not tested)  Comments:    LOWER EXTREMITY MMT:    MMT Right eval Left eval  Hip flexion 4 4  Hip abduction (modified sitting) 5 5  Hip internal rotation 4+ 4 *  Hip external rotation 4 + 4 *  Knee flexion 5 5  Knee extension 4+ 4+ (within available ROM)  Ankle dorsiflexion     (Blank rows = not tested) (Key: WFL = within functional limits not formally assessed, * = concordant pain, s = stiffness/stretching sensation, NT = not tested)  Comments:    LOWER EXTREMITY SPECIAL TESTS:  deferred  FUNCTIONAL TESTS:  5xSTS: 13.58sec w/o UE support 08/18/23 5xSTS: 11.95sec no UE support, mild L knee pain 09/17/23 5xSTS 12.89sec no UE support   GAIT: Distance walked: within clinic Assistive device utilized: None Level of assistance: Complete Independence Comments: widened BOS, reduced L knee ext in stance, reduced hip ext BIL, increased lateral weightshifting BIL and intermittent steadying on nearby objects                                                                                                                                TREATMENT DATE:  [facility] Adult PT Treatment:  DATE: 09/17/23 Therapeutic Exercise: Nu step L5 5 min LE/UE during subjective  Green band hamstring  curl 3x10 2# LAQ 2x8 LLE  Bosu TKE x12 for quad endurance HEP discussion/education  Therapeutic Activity: Mini squat 2x5 w/ UE support  4 inch fwd step 2x5 BIL  6 inch fwd step up x5 BIL  2 laps total walking with cues for symmetrical WB    [facility] Adult PT Treatment:                                                DATE: 09/15/23 Vitals: HR upper 90s, SpO2 96-97% RA, BP 130/90 Therapeutic Exercise: Green band hamstring curl 2x8 BIL Red band LAQ 2x10 BIL Green band TKE 2x5 BIL HEP update + education, relevant anatomy/physiology and rationale for interventions  Therapeutic Activity: Laps around gym during subjective, 3 laps, 2 laps with cues for weight shifting mechanics, posture STS 3x5 from lowest mat cues for symmetrical WB and pacing   [facility] Adult PT Treatment:                                                DATE: 09/10/23 Therapeutic Exercise: Nu step L3 5 min during subjective  Hamstring stretch on prop 3x30sec BIL Red band hamstring curl 2x10 BIL cues for control  HEP update + education/handout  Neuromuscular re-ed: Propped quad set 2x12 BIL cues for reduced hip compensations [place] and yoga block step overs 2x2 laps CGA cues for stability and posture   PATIENT EDUCATION:  Education details: rationale for interventions, HEP  Person educated: Patient Education method: Explanation, Demonstration, Tactile cues, Verbal cues Education comprehension: verbalized understanding, returned demonstration, verbal cues required, tactile cues required, and needs further education     HOME EXERCISE PROGRAM: Access Code: [email or web address] URL: [email or web address] Date: 09/10/2023 Prepared by: Fransisco Hertz  Exercises - Sit to Stand with Armchair  - 2-3 x daily - 1 sets - 5 reps - Seated Quad Set  - 2-3 x daily - 1 sets - 12 reps - Seated Long Arc Quad  - 2-3 x daily - 1 sets - 12 reps - Side Stepping with Counter Support  - 2-3 x daily - 1 sets - 8 reps - Backward Walking  with Counter Support  - 2-3 x daily - 1 sets - 8 reps - Seated Hamstring Curl with Anchored Resistance  - 2-3 x daily - 1 sets - 8 reps  ASSESSMENT:  CLINICAL IMPRESSION: 09/17/2023 Pt arrives w/o pain but does endorse some increased pain over past couple of days. Continuing to work on open and closed chain quad strengthening, hamstring strengthening. Able to tolerate increased time with step ups today. No adverse events, tolerates well. Recommend continuing along current POC in order to address relevant deficits and improve functional tolerance. Pt departs today's session in no acute distress, all voiced questions/concerns addressed appropriately from PT perspective.     Per eval - Patient is a very pleasant 74 y.o. gentleman who was seen today for physical therapy evaluation and treatment for bilateral knee pain, L>R. Symptoms ongoing for about a year and a half per pt and limiting tolerance to WB tasks such as walking and standing, although pt notes improvement since onset. On exam he  demonstrates discrepancy between active and passive movement of both knees, reduced quad strength against gravity on L, and reduced rotational hip strength. 5xSTS around cutoff score for fall risk and reduced functional mobility (12-14sec varying by population). Pt tolerates exam/HEP well without increase in pain and no adverse events. Recommend trial of skilled PT to address aforementioned deficits with aim of improving functional tolerance and reducing pain with typical activities. Pt departs today's session in no acute distress, all voiced concerns/questions addressed appropriately from PT perspective.    OBJECTIVE IMPAIRMENTS: Abnormal gait, decreased activity tolerance, decreased balance, decreased endurance, decreased mobility, difficulty walking, decreased ROM, decreased strength, improper body mechanics, and pain.   ACTIVITY LIMITATIONS: carrying, lifting, standing, squatting, transfers, and locomotion  level  PARTICIPATION LIMITATIONS: meal prep, cleaning, laundry, and community activity  PERSONAL FACTORS: Age, Time since onset of injury/illness/exacerbation, and 3+ comorbidities: headaches, HTN, CKD  are also affecting patient's functional outcome.   REHAB POTENTIAL: Good  CLINICAL DECISION MAKING: Stable/uncomplicated  EVALUATION COMPLEXITY: Low   GOALS: Goals reviewed with patient? Yes  SHORT TERM GOALS: Target date: 09/01/2023 Pt will demonstrate appropriate understanding and performance of initially prescribed HEP in order to facilitate improved independence with management of symptoms.  Baseline: HEP provided on eval 08/25/23: reports good HEP adherence Goal status: MET  2. Pt will report at least 25% decrease in overall pain levels in past week in order to facilitate improved tolerance to basic ADLs/mobility.   Baseline: 0-8/10  08/25/23: 410 at worst   Goal status: MET  LONG TERM GOALS: Target date: 09/29/2023 Pt will score 62 or greater on FOTO in order to demonstrate improved perception of function due to symptoms.  Baseline: 60 Goal status: INITIAL  2.  Pt will demonstrate at least 0-120 degrees of knee AROM in order to facilitate improved tolerance to functional movements such as walking/squatting.  Baseline: see ROM chart above Goal status: INITIAL  3.  Pt will report/demonstrate ability to stand/walk up to 30 min w/ less than 3 pt increase in resting pain in order to facilitate improved tolerance to daily tasks/mobility.  Baseline: difficulty tolerating >10-15 min standing/walking without increase in pain Goal status: INITIAL  4.  Pt will be able to perform 5xSTS in less than or equal to 10sec in order to demonstrate reduced fall risk and improved functional independence (MCID 5xSTS = 2.3 sec). Baseline: 13sec  Goal status: INITIAL   5. Pt will report at least 50% decrease in overall pain levels in past week in order to facilitate improved tolerance to basic  ADLs/mobility.   Baseline: 0-8/10  Goal status: INITIAL     PLAN:  PT FREQUENCY: 2x/week  PT DURATION: 8 weeks  PLANNED INTERVENTIONS: 97164- PT Re-evaluation, 97110-Therapeutic exercises, 97530- Therapeutic activity, [id number]- Neuromuscular re-education, 97535- Self Care, [id number]- Manual therapy, [phone]- Gait training, [phone]- Aquatic Therapy, Patient/Family education, Balance training, Stair training, Taping, Dry Needling, Joint mobilization, Cryotherapy, and Moist heat  PLAN FOR NEXT SESSION: Review/update HEP PRN. Work on [employer] exercises as appropriate with emphasis on quad control/strength, hip rotational strength, WB tolerance and functional mobility. Symptom modification strategies as indicated/appropriate.    Ashley Murrain PT, DPT 09/17/2023 11:00 AM

## 2023-09-17 ENCOUNTER — Ambulatory Visit: Payer: 59 | Admitting: Physical Therapy

## 2023-09-17 ENCOUNTER — Encounter: Payer: Self-pay | Admitting: Physical Therapy

## 2023-09-17 DIAGNOSIS — M25562 Pain in left knee: Secondary | ICD-10-CM | POA: Diagnosis not present

## 2023-09-17 DIAGNOSIS — M25561 Pain in right knee: Secondary | ICD-10-CM | POA: Diagnosis not present

## 2023-09-17 DIAGNOSIS — G8929 Other chronic pain: Secondary | ICD-10-CM | POA: Diagnosis not present

## 2023-09-17 DIAGNOSIS — R2689 Other abnormalities of gait and mobility: Secondary | ICD-10-CM | POA: Diagnosis not present

## 2023-09-22 ENCOUNTER — Encounter: Payer: Self-pay | Admitting: Physical Therapy

## 2023-09-22 ENCOUNTER — Ambulatory Visit: Payer: 59 | Admitting: Physical Therapy

## 2023-09-22 ENCOUNTER — Other Ambulatory Visit: Payer: Self-pay | Admitting: Sports Medicine

## 2023-09-22 DIAGNOSIS — M25562 Pain in left knee: Secondary | ICD-10-CM | POA: Diagnosis not present

## 2023-09-22 DIAGNOSIS — R2689 Other abnormalities of gait and mobility: Secondary | ICD-10-CM

## 2023-09-22 DIAGNOSIS — G8929 Other chronic pain: Secondary | ICD-10-CM

## 2023-09-22 DIAGNOSIS — M25561 Pain in right knee: Secondary | ICD-10-CM | POA: Diagnosis not present

## 2023-09-22 NOTE — Therapy (Signed)
 OUTPATIENT PHYSICAL THERAPY TREATMENT   Patient Name: Jose Waller MRN: [id number] DOB:1950-11-20, 73 y.o., male Today's Date: 09/22/2023  END OF SESSION:  PT End of Session - 09/22/23 0802     Visit Number 12    Number of Visits 17    Date for PT Re-Evaluation 09/29/23    Authorization Type [employer]    PT Start Time 0756    PT Stop Time 0834    PT Time Calculation (min) 38 min    Activity Tolerance Patient tolerated treatment well                Past Medical History:  Diagnosis Date   Allergy    Arthritis    Cataract    MD just watching   CKD (chronic kidney disease) stage 3, GFR 30-59 ml/min (HCC) 06/05/2018   Cluster headache    resolved   Glaucoma    Hypertension    Substance abuse (HCC)    cocaine - quit 1998   Past Surgical History:  Procedure Laterality Date   back lumbar  2004   fusion    COLONOSCOPY  06/09/2009   Dr Mellody Memos - normal   WISDOM TOOTH EXTRACTION     Patient Active Problem List   Diagnosis Date Noted   Chronic pain of left knee 02/21/2022   Erectile dysfunction 04/23/2021   IFG (impaired fasting glucose) 10/23/2020   OAB (overactive bladder) 10/20/2020   Anorexia 02/09/2020   Combined forms of age-related cataract of left eye 09/23/2019   Primary osteoarthritis of both knees 04/22/2019   Slurred speech 01/28/2019   Urinary urgency 01/28/2019   CKD stage G3a/A1, GFR 45-59 and albumin creatinine ratio <30 mg/g (HCC) 06/05/2018   Migraine 09/08/2013   Low back pain 09/08/2013   Hyperlipidemia 12/31/2010   GERD 11/08/2009   HYPERTENSION 04/22/2008    PCP: Agapito Games, MD  REFERRING PROVIDER: Monica Becton, MD  REFERRING DIAG: M17.0 (ICD-10-CM) - Primary osteoarthritis of both knees  THERAPY DIAG:  Chronic pain of both knees  Other abnormalities of gait and mobility  Rationale for Evaluation and Treatment: Rehabilitation  ONSET DATE: about a year and a half  SUBJECTIVE:  Per eval - Pt reports moving  into new apartment about a year and a half ago, subsequent increase in BIL knee pain. Denies any MOI or trauma. States he has had injections in both knees, R knee essentially resolved at this point, L knee improved but not quite to the same degree as R. Most difficulty with WB tasks. Pt notes symptoms improve significantly with brace use. He reports sometimes feeling burning in knees but thinks injections have helped with that. Pt denies any N/T, swelling, or fevers/night sweats.  Pt states he used to be fairly active, travelling to flea markets. Wants to try to get back into the gym, interested in water aerobics.   SUBJECTIVE STATEMENT: 09/22/2023 Pt states his knee is bothering him more today, unsure of provocative factor. States he didn't do as much activity as usual. Felt good after last session.   PERTINENT HISTORY: headaches, HTN, CKD  PAIN:  Are you having pain: 5/10 L knee Worst: 5/10    Per eval -  Location/description: lateral L knee, aching and sharp at times Best-worst over past week: 0-8/10  - aggravating factors: standing 10-15 min, walking 10-15 - Easing factors: pain cream, injections, brace    PRECAUTIONS: None  WEIGHT BEARING RESTRICTIONS: No  FALLS:  Has patient fallen in last 6  months? No  LIVING ENVIRONMENT: 2 level apartment w/ elevator, lives in assisted living facility Pt takes care of housework  OCCUPATION: retired Clinical research associate for [employer]  PLOF: Independent  PATIENT GOALS: getting back in gym, water aerobics, treadmill  NEXT MD VISIT: TBD per pt report   OBJECTIVE:  Note: Objective measures were completed at Evaluation unless otherwise noted.  DIAGNOSTIC FINDINGS:  No recent imaging in chart per EPIC review  PATIENT SURVEYS:  FOTO 60 > 62 FOTO 08/25/23: 59   COGNITION: Overall cognitive status: Within functional limits for tasks assessed     SENSATION: Light touch grossly symmetrical BIL LE  PALPATION: Concordant tenderness L lateral  joint line, otherwise unremarkable overall.   LOWER EXTREMITY ROM:     Active  Right eval Left eval L 08/11/23 L 08/18/23 L 09/10/23  Hip flexion       Hip extension       Hip internal rotation       Hip external rotation       Knee extension A/P: 0 A: lacking 12 deg against gravity  P: full  A: lacking 8 degrees against gravity  A: lacking 4 deg against gravity  A: lacking 4-5 deg against gravity  Knee flexion A: 112  P: 120 A: 110 deg *  P: 120 deg *  A: 118 deg * supine A: 124 deg painless   (Blank rows = not tested) (Key: WFL = within functional limits not formally assessed, * = concordant pain, s = stiffness/stretching sensation, NT = not tested)  Comments:    LOWER EXTREMITY MMT:    MMT Right eval Left eval  Hip flexion 4 4  Hip abduction (modified sitting) 5 5  Hip internal rotation 4+ 4 *  Hip external rotation 4 + 4 *  Knee flexion 5 5  Knee extension 4+ 4+ (within available ROM)  Ankle dorsiflexion     (Blank rows = not tested) (Key: WFL = within functional limits not formally assessed, * = concordant pain, s = stiffness/stretching sensation, NT = not tested)  Comments:    LOWER EXTREMITY SPECIAL TESTS:  deferred  FUNCTIONAL TESTS:  5xSTS: 13.58sec w/o UE support 08/18/23 5xSTS: 11.95sec no UE support, mild L knee pain 09/17/23 5xSTS 12.89sec no UE support   GAIT: Distance walked: within clinic Assistive device utilized: None Level of assistance: Complete Independence Comments: widened BOS, reduced L knee ext in stance, reduced hip ext BIL, increased lateral weightshifting BIL and intermittent steadying on nearby objects                                                                                                                                TREATMENT DATE:  [facility] Adult PT Treatment:  DATE: 09/22/23 Therapeutic Exercise: Nu step L5 LE only 5 min during subjective Green band hamstring curl x12 BIL; blue  band x8 BIL TKE ball at wall 2x5 BIL cues to reduce hip compensations Bosu TKE 2x8 BIL cues for breath control Seated hamstring stretch 3x30sec LLE Propped quad set 2x8 LLE HEP education/discussion  Therapeutic Activity: 4 inch step knee flexion closed chain x10 BIL LE 4 inch fwd step up LLE x10 Mini squat UE support x8     [facility] Adult PT Treatment:                                                DATE: 09/17/23 Therapeutic Exercise: Nu step L5 5 min LE/UE during subjective  Green band hamstring curl 3x10 2# LAQ 2x8 LLE  Bosu TKE x12 for quad endurance HEP discussion/education  Therapeutic Activity: Mini squat 2x5 w/ UE support  4 inch fwd step 2x5 BIL  6 inch fwd step up x5 BIL  2 laps total walking with cues for symmetrical WB    [facility] Adult PT Treatment:                                                DATE: 09/15/23 Vitals: HR upper 90s, SpO2 96-97% RA, BP 130/90 Therapeutic Exercise: Green band hamstring curl 2x8 BIL Red band LAQ 2x10 BIL Green band TKE 2x5 BIL HEP update + education, relevant anatomy/physiology and rationale for interventions  Therapeutic Activity: Laps around gym during subjective, 3 laps, 2 laps with cues for weight shifting mechanics, posture STS 3x5 from lowest mat cues for symmetrical WB and pacing     PATIENT EDUCATION:  Education details: rationale for interventions, HEP  Person educated: Patient Education method: Explanation, Demonstration, Tactile cues, Verbal cues Education comprehension: verbalized understanding, returned demonstration, verbal cues required, tactile cues required, and needs further education     HOME EXERCISE PROGRAM: Access Code: [email or web address] URL: [email or web address] Date: 09/10/2023 Prepared by: Fransisco Hertz  Exercises - Sit to Stand with Armchair  - 2-3 x daily - 1 sets - 5 reps - Seated Quad Set  - 2-3 x daily - 1 sets - 12 reps - Seated Long Arc Quad  - 2-3 x daily - 1 sets - 12 reps - Side  Stepping with Counter Support  - 2-3 x daily - 1 sets - 8 reps - Backward Walking with Counter Support  - 2-3 x daily - 1 sets - 8 reps - Seated Hamstring Curl with Anchored Resistance  - 2-3 x daily - 1 sets - 8 reps  ASSESSMENT:  CLINICAL IMPRESSION: 09/22/2023 Pt arrives w/ increased pain (5/10), reduced activity over weekend. Symptoms are a bit more irritable in session today which initially limits progression, but improves as session goes on with reduction in pain. Most difficulty with TKE and hamstring progressions. Reports 0/10 pain on departure, no adverse events. Recommend continuing along current POC in order to address relevant deficits and improve functional tolerance. Pt departs today's session in no acute distress, all voiced questions/concerns addressed appropriately from PT perspective.      Per eval - Patient is a very pleasant 73 y.o. gentleman who was seen today for physical therapy evaluation and treatment for bilateral knee pain,  L>R. Symptoms ongoing for about a year and a half per pt and limiting tolerance to WB tasks such as walking and standing, although pt notes improvement since onset. On exam he demonstrates discrepancy between active and passive movement of both knees, reduced quad strength against gravity on L, and reduced rotational hip strength. 5xSTS around cutoff score for fall risk and reduced functional mobility (12-14sec varying by population). Pt tolerates exam/HEP well without increase in pain and no adverse events. Recommend trial of skilled PT to address aforementioned deficits with aim of improving functional tolerance and reducing pain with typical activities. Pt departs today's session in no acute distress, all voiced concerns/questions addressed appropriately from PT perspective.    OBJECTIVE IMPAIRMENTS: Abnormal gait, decreased activity tolerance, decreased balance, decreased endurance, decreased mobility, difficulty walking, decreased ROM, decreased  strength, improper body mechanics, and pain.   ACTIVITY LIMITATIONS: carrying, lifting, standing, squatting, transfers, and locomotion level  PARTICIPATION LIMITATIONS: meal prep, cleaning, laundry, and community activity  PERSONAL FACTORS: Age, Time since onset of injury/illness/exacerbation, and 3+ comorbidities: headaches, HTN, CKD  are also affecting patient's functional outcome.   REHAB POTENTIAL: Good  CLINICAL DECISION MAKING: Stable/uncomplicated  EVALUATION COMPLEXITY: Low   GOALS: Goals reviewed with patient? Yes  SHORT TERM GOALS: Target date: 09/01/2023 Pt will demonstrate appropriate understanding and performance of initially prescribed HEP in order to facilitate improved independence with management of symptoms.  Baseline: HEP provided on eval 08/25/23: reports good HEP adherence Goal status: MET  2. Pt will report at least 25% decrease in overall pain levels in past week in order to facilitate improved tolerance to basic ADLs/mobility.   Baseline: 0-8/10  08/25/23: 4/10 at worst   Goal status: MET  LONG TERM GOALS: Target date: 09/29/2023 Pt will score 62 or greater on FOTO in order to demonstrate improved perception of function due to symptoms.  Baseline: 60 Goal status: INITIAL  2.  Pt will demonstrate at least 0-120 degrees of knee AROM in order to facilitate improved tolerance to functional movements such as walking/squatting.  Baseline: see ROM chart above Goal status: INITIAL  3.  Pt will report/demonstrate ability to stand/walk up to 30 min w/ less than 3 pt increase in resting pain in order to facilitate improved tolerance to daily tasks/mobility.  Baseline: difficulty tolerating >10-15 min standing/walking without increase in pain Goal status: INITIAL  4.  Pt will be able to perform 5xSTS in less than or equal to 10sec in order to demonstrate reduced fall risk and improved functional independence (MCID 5xSTS = 2.3 sec). Baseline: 13sec  Goal status:  INITIAL   5. Pt will report at least 50% decrease in overall pain levels in past week in order to facilitate improved tolerance to basic ADLs/mobility.   Baseline: 0-8/10  Goal status: INITIAL     PLAN:  PT FREQUENCY: 2x/week  PT DURATION: 8 weeks  PLANNED INTERVENTIONS: 97164- PT Re-evaluation, 97110-Therapeutic exercises, 97530- Therapeutic activity, [id number]- Neuromuscular re-education, 97535- Self Care, 16109- Manual therapy, [phone]- Gait training, [phone]- Aquatic Therapy, Patient/Family education, Balance training, Stair training, Taping, Dry Needling, Joint mobilization, Cryotherapy, and Moist heat  PLAN FOR NEXT SESSION: Review/update HEP PRN. Work on [employer] exercises as appropriate with emphasis on quad control/strength, hip rotational strength, WB tolerance and functional mobility. Symptom modification strategies as indicated/appropriate.    Ashley Murrain PT, DPT 09/22/2023 8:34 AM

## 2023-09-23 NOTE — Therapy (Signed)
 OUTPATIENT PHYSICAL THERAPY TREATMENT   Patient Name: Jose Waller MRN: [id number] DOB:1951/01/01, 73 y.o., male Today's Date: 09/23/2023  END OF SESSION:       Past Medical History:  Diagnosis Date   Allergy    Arthritis    Cataract    MD just watching   CKD (chronic kidney disease) stage 3, GFR 30-59 ml/min (HCC) 06/05/2018   Cluster headache    resolved   Glaucoma    Hypertension    Substance abuse (HCC)    cocaine - quit 1998   Past Surgical History:  Procedure Laterality Date   back lumbar  2004   fusion    COLONOSCOPY  06/09/2009   Dr Mellody Memos - normal   WISDOM TOOTH EXTRACTION     Patient Active Problem List   Diagnosis Date Noted   Chronic pain of left knee 02/21/2022   Erectile dysfunction 04/23/2021   IFG (impaired fasting glucose) 10/23/2020   OAB (overactive bladder) 10/20/2020   Anorexia 02/09/2020   Combined forms of age-related cataract of left eye 09/23/2019   Primary osteoarthritis of both knees 04/22/2019   Slurred speech 01/28/2019   Urinary urgency 01/28/2019   CKD stage G3a/A1, GFR 45-59 and albumin creatinine ratio <30 mg/g (HCC) 06/05/2018   Migraine 09/08/2013   Low back pain 09/08/2013   Hyperlipidemia 12/31/2010   GERD 11/08/2009   HYPERTENSION 04/22/2008    PCP: Agapito Games, MD  REFERRING PROVIDER: Monica Becton, MD  REFERRING DIAG: M17.0 (ICD-10-CM) - Primary osteoarthritis of both knees  THERAPY DIAG:  No diagnosis found.  Rationale for Evaluation and Treatment: Rehabilitation  ONSET DATE: about a year and a half  SUBJECTIVE:  Per eval - Pt reports moving into new apartment about a year and a half ago, subsequent increase in BIL knee pain. Denies any MOI or trauma. States he has had injections in both knees, R knee essentially resolved at this point, L knee improved but not quite to the same degree as R. Most difficulty with WB tasks. Pt notes symptoms improve significantly with brace use. He reports  sometimes feeling burning in knees but thinks injections have helped with that. Pt denies any N/T, swelling, or fevers/night sweats.  Pt states he used to be fairly active, travelling to flea markets. Wants to try to get back into the gym, interested in water aerobics.   SUBJECTIVE STATEMENT: 09/23/2023 ***  *** Pt states his knee is bothering him more today, unsure of provocative factor. States he didn't do as much activity as usual. Felt good after last session.   PERTINENT HISTORY: headaches, HTN, CKD  PAIN:  Are you having pain: 5/10 L knee ***  Worst: 5/10    Per eval -  Location/description: lateral L knee, aching and sharp at times Best-worst over past week: 0-8/10  - aggravating factors: standing 10-15 min, walking 10-15 - Easing factors: pain cream, injections, brace    PRECAUTIONS: None  WEIGHT BEARING RESTRICTIONS: No  FALLS:  Has patient fallen in last 6 months? No  LIVING ENVIRONMENT: 2 level apartment w/ elevator, lives in assisted living facility Pt takes care of housework  OCCUPATION: retired Clinical research associate for [employer]  PLOF: Independent  PATIENT GOALS: getting back in gym, water aerobics, treadmill  NEXT MD VISIT: TBD per pt report   OBJECTIVE:  Note: Objective measures were completed at Evaluation unless otherwise noted.  DIAGNOSTIC FINDINGS:  No recent imaging in chart per EPIC review  PATIENT SURVEYS:  FOTO 60 >  62 FOTO 08/25/23: 59  FOTO 09/24/23: ***   COGNITION: Overall cognitive status: Within functional limits for tasks assessed     SENSATION: Light touch grossly symmetrical BIL LE  PALPATION: Concordant tenderness L lateral joint line, otherwise unremarkable overall.   LOWER EXTREMITY ROM:     Active  Right eval Left eval L 08/11/23 L 08/18/23 L 09/10/23 L 09/24/23 ***   Hip flexion        Hip extension        Hip internal rotation        Hip external rotation        Knee extension A/P: 0 A: lacking 12 deg against gravity  P:  full  A: lacking 8 degrees against gravity  A: lacking 4 deg against gravity  A: lacking 4-5 deg against gravity   Knee flexion A: 112  P: 120 A: 110 deg *  P: 120 deg *  A: 118 deg * supine A: 124 deg painless    (Blank rows = not tested) (Key: WFL = within functional limits not formally assessed, * = concordant pain, s = stiffness/stretching sensation, NT = not tested)  Comments:    LOWER EXTREMITY MMT:    MMT Right eval Left eval R/L 09/23/23  Hip flexion 4 4   Hip abduction (modified sitting) 5 5   Hip internal rotation 4+ 4 *   Hip external rotation 4 + 4 *   Knee flexion 5 5   Knee extension 4+ 4+ (within available ROM) ***  Ankle dorsiflexion      (Blank rows = not tested) (Key: WFL = within functional limits not formally assessed, * = concordant pain, s = stiffness/stretching sensation, NT = not tested)  Comments:    LOWER EXTREMITY SPECIAL TESTS:  deferred  FUNCTIONAL TESTS:  5xSTS: 13.58sec w/o UE support 08/18/23 5xSTS: 11.95sec no UE support, mild L knee pain 09/17/23 5xSTS 12.89sec no UE support 09/24/23 5xSTS: ***    GAIT: Distance walked: within clinic Assistive device utilized: None Level of assistance: Complete Independence Comments: widened BOS, reduced L knee ext in stance, reduced hip ext BIL, increased lateral weightshifting BIL and intermittent steadying on nearby objects                                                                                                                                TREATMENT DATE:  [facility] Adult PT Treatment:                                                DATE: 09/24/23 Therapeutic Exercise: *** Manual Therapy: *** Neuromuscular re-ed: *** Therapeutic Activity: *** Modalities: *** Self Care: [facility] Adult PT Treatment:  DATE: 09/22/23 Therapeutic Exercise: Nu step L5 LE only 5 min during subjective Green band hamstring curl x12 BIL; blue band x8 BIL TKE ball  at wall 2x5 BIL cues to reduce hip compensations Bosu TKE 2x8 BIL cues for breath control Seated hamstring stretch 3x30sec LLE Propped quad set 2x8 LLE HEP education/discussion  Therapeutic Activity: 4 inch step knee flexion closed chain x10 BIL LE 4 inch fwd step up LLE x10 Mini squat UE support x8     [facility] Adult PT Treatment:                                                DATE: 09/17/23 Therapeutic Exercise: Nu step L5 5 min LE/UE during subjective  Green band hamstring curl 3x10 2# LAQ 2x8 LLE  Bosu TKE x12 for quad endurance HEP discussion/education  Therapeutic Activity: Mini squat 2x5 w/ UE support  4 inch fwd step 2x5 BIL  6 inch fwd step up x5 BIL  2 laps total walking with cues for symmetrical WB    [facility] Adult PT Treatment:                                                DATE: 09/15/23 Vitals: HR upper 90s, SpO2 96-97% RA, BP 130/90 Therapeutic Exercise: Green band hamstring curl 2x8 BIL Red band LAQ 2x10 BIL Green band TKE 2x5 BIL HEP update + education, relevant anatomy/physiology and rationale for interventions  Therapeutic Activity: Laps around gym during subjective, 3 laps, 2 laps with cues for weight shifting mechanics, posture STS 3x5 from lowest mat cues for symmetrical WB and pacing     PATIENT EDUCATION:  Education details: rationale for interventions, HEP  Person educated: Patient Education method: Explanation, Demonstration, Tactile cues, Verbal cues Education comprehension: verbalized understanding, returned demonstration, verbal cues required, tactile cues required, and needs further education     HOME EXERCISE PROGRAM: Access Code: [email or web address] URL: [email or web address] Date: 09/10/2023 Prepared by: Fransisco Hertz  Exercises - Sit to Stand with Armchair  - 2-3 x daily - 1 sets - 5 reps - Seated Quad Set  - 2-3 x daily - 1 sets - 12 reps - Seated Long Arc Quad  - 2-3 x daily - 1 sets - 12 reps - Side Stepping with Counter  Support  - 2-3 x daily - 1 sets - 8 reps - Backward Walking with Counter Support  - 2-3 x daily - 1 sets - 8 reps - Seated Hamstring Curl with Anchored Resistance  - 2-3 x daily - 1 sets - 8 reps  ASSESSMENT:  CLINICAL IMPRESSION: 09/23/2023 ***  *** Pt arrives w/ increased pain (5/10), reduced activity over weekend. Symptoms are a bit more irritable in session today which initially limits progression, but improves as session goes on with reduction in pain. Most difficulty with TKE and hamstring progressions. Reports 0/10 pain on departure, no adverse events. Recommend continuing along current POC in order to address relevant deficits and improve functional tolerance. Pt departs today's session in no acute distress, all voiced questions/concerns addressed appropriately from PT perspective.      Per eval - Patient is a very pleasant 73 y.o. gentleman who was seen today for physical therapy evaluation and treatment for  bilateral knee pain, L>R. Symptoms ongoing for about a year and a half per pt and limiting tolerance to WB tasks such as walking and standing, although pt notes improvement since onset. On exam he demonstrates discrepancy between active and passive movement of both knees, reduced quad strength against gravity on L, and reduced rotational hip strength. 5xSTS around cutoff score for fall risk and reduced functional mobility (12-14sec varying by population). Pt tolerates exam/HEP well without increase in pain and no adverse events. Recommend trial of skilled PT to address aforementioned deficits with aim of improving functional tolerance and reducing pain with typical activities. Pt departs today's session in no acute distress, all voiced concerns/questions addressed appropriately from PT perspective.    OBJECTIVE IMPAIRMENTS: Abnormal gait, decreased activity tolerance, decreased balance, decreased endurance, decreased mobility, difficulty walking, decreased ROM, decreased strength, improper  body mechanics, and pain.   ACTIVITY LIMITATIONS: carrying, lifting, standing, squatting, transfers, and locomotion level  PARTICIPATION LIMITATIONS: meal prep, cleaning, laundry, and community activity  PERSONAL FACTORS: Age, Time since onset of injury/illness/exacerbation, and 3+ comorbidities: headaches, HTN, CKD  are also affecting patient's functional outcome.   REHAB POTENTIAL: Good  CLINICAL DECISION MAKING: Stable/uncomplicated  EVALUATION COMPLEXITY: Low   GOALS: Goals reviewed with patient? Yes  SHORT TERM GOALS: Target date: 09/01/2023 Pt will demonstrate appropriate understanding and performance of initially prescribed HEP in order to facilitate improved independence with management of symptoms.  Baseline: HEP provided on eval 08/25/23: reports good HEP adherence Goal status: MET  2. Pt will report at least 25% decrease in overall pain levels in past week in order to facilitate improved tolerance to basic ADLs/mobility.   Baseline: 0-8/10  08/25/23: 4/10 at worst   Goal status: MET  LONG TERM GOALS: Target date: 09/29/2023 Pt will score 62 or greater on FOTO in order to demonstrate improved perception of function due to symptoms.  Baseline: 60 09/24/23: *** Goal status: ***   2.  Pt will demonstrate at least 0-120 degrees of knee AROM in order to facilitate improved tolerance to functional movements such as walking/squatting.  Baseline: see ROM chart above 09/24/23: *** Goal status: ***   3.  Pt will report/demonstrate ability to stand/walk up to 30 min w/ less than 3 pt increase in resting pain in order to facilitate improved tolerance to daily tasks/mobility.  Baseline: difficulty tolerating >10-15 min standing/walking without increase in pain 09/24/23: *** Goal status: ***   4.  Pt will be able to perform 5xSTS in less than or equal to 10sec in order to demonstrate reduced fall risk and improved functional independence (MCID 5xSTS = 2.3 sec). Baseline: 13sec   09/24/23: *** Goal status: ***   5. Pt will report at least 50% decrease in overall pain levels in past week in order to facilitate improved tolerance to basic ADLs/mobility.   Baseline: 0-8/10  09/24/23: ***  Goal status: ***    PLAN:  PT FREQUENCY: 2x/week  PT DURATION: 8 weeks  PLANNED INTERVENTIONS: 97164- PT Re-evaluation, 97110-Therapeutic exercises, 97530- Therapeutic activity, 97112- Neuromuscular re-education, 97535- Self Care, 16109- Manual therapy, [phone]- Gait training, [phone]- Aquatic Therapy, Patient/Family education, Balance training, Stair training, Taping, Dry Needling, Joint mobilization, Cryotherapy, and Moist heat  PLAN FOR NEXT SESSION: Review/update HEP PRN. Work on [employer] exercises as appropriate with emphasis on quad control/strength, hip rotational strength, WB tolerance and functional mobility. Symptom modification strategies as indicated/appropriate.    Ashley Murrain PT, DPT 09/23/2023 8:30 AM

## 2023-09-24 ENCOUNTER — Ambulatory Visit: Payer: 59 | Admitting: Physical Therapy

## 2023-09-24 ENCOUNTER — Encounter: Payer: Self-pay | Admitting: Physical Therapy

## 2023-09-24 ENCOUNTER — Telehealth: Payer: Self-pay

## 2023-09-24 DIAGNOSIS — M25562 Pain in left knee: Secondary | ICD-10-CM | POA: Diagnosis not present

## 2023-09-24 DIAGNOSIS — M25561 Pain in right knee: Secondary | ICD-10-CM | POA: Diagnosis not present

## 2023-09-24 DIAGNOSIS — G8929 Other chronic pain: Secondary | ICD-10-CM

## 2023-09-24 DIAGNOSIS — R2689 Other abnormalities of gait and mobility: Secondary | ICD-10-CM | POA: Diagnosis not present

## 2023-09-24 NOTE — Telephone Encounter (Signed)
 Patient came into office to schedule appointment with Dr. Karie Schwalbe for his  osteoarthritis of both knees, patient would like to know if he could get medication sent to his pharmacy until his appointment in [place]. Patient states that he would not like naproxen (being told that bad for kidneys) , but he would like CELEBREX, please advise, thanks.

## 2023-09-24 NOTE — Telephone Encounter (Signed)
 He told me at his last visit that Celebrex was not helping, how does he feel about tramadol?  This can be taken for breakthrough pain on top of arthritis Tylenol 650 mg 3 times daily.

## 2023-09-25 NOTE — Telephone Encounter (Signed)
 Patient calling back and states he would not like the tramadol at all. Patient requesting the celebrex to be sent in.   Please assist patient further.

## 2023-09-26 MED ORDER — CELECOXIB 200 MG PO CAPS: ORAL_CAPSULE | ORAL | 2 refills | Status: DC

## 2023-09-26 NOTE — Telephone Encounter (Signed)
 Okay, Celebrex sent

## 2023-11-04 ENCOUNTER — Encounter: Payer: Self-pay | Admitting: Sports Medicine

## 2023-11-04 ENCOUNTER — Other Ambulatory Visit (INDEPENDENT_AMBULATORY_CARE_PROVIDER_SITE_OTHER)

## 2023-11-04 ENCOUNTER — Ambulatory Visit (INDEPENDENT_AMBULATORY_CARE_PROVIDER_SITE_OTHER): Payer: 59 | Admitting: Sports Medicine

## 2023-11-04 DIAGNOSIS — M170 Bilateral primary osteoarthritis of knee: Secondary | ICD-10-CM | POA: Diagnosis not present

## 2023-11-04 MED ORDER — TRIAMCINOLONE ACETONIDE 40 MG/ML IJ SUSP
40.0000 mg | Freq: Once | INTRAMUSCULAR | Status: AC
  Administered 2023-11-04: 40 mg via INTRAMUSCULAR

## 2023-11-04 NOTE — Progress Notes (Signed)
    Procedures performed today:    Procedure: Real-time Ultrasound Guided injection of the left knee Device: Samsung HS60  Verbal informed consent obtained.  Time-out conducted.  Noted no overlying erythema, induration, or other signs of local infection.  Skin prepped in a sterile fashion.  Local anesthesia: Topical Ethyl chloride.  With sterile technique and under real time ultrasound guidance: Trace effusion noted, 1 cc Kenalog 40, 2 cc lidocaine, 2 cc bupivacaine injected easily Completed without difficulty  Advised to call if fevers/chills, erythema, induration, drainage, or persistent bleeding.  Images permanently stored and available for review in PACS.  Impression: Technically successful ultrasound guided injection.  Independent interpretation of notes and tests performed by another provider:   None.  Brief History, Exam, Impression, and Recommendations:    Primary osteoarthritis of both knees Pleasant 73 year old male with bilateral knee osteoarthritis, last right knee injection March 2024, last left knee injection November 2024, now with recurrence of left knee pain, repeat left knee injection today. He will continue arthritis Tylenol and naproxen.    ____________________________________________ Ihor Austin. Benjamin Stain, M.D., ABFM., CAQSM., AME. Primary Care and Sports Medicine [facility]  Adjunct Professor of Family Medicine  [place] [employer]  Restaurant manager, fast food

## 2023-11-04 NOTE — Assessment & Plan Note (Signed)
 Pleasant 73 year old male with bilateral knee osteoarthritis, last right knee injection March 2024, last left knee injection November 2024, now with recurrence of left knee pain, repeat left knee injection today. He will continue arthritis Tylenol and naproxen.

## 2023-11-05 ENCOUNTER — Other Ambulatory Visit: Payer: Self-pay | Admitting: Family Medicine

## 2023-11-05 DIAGNOSIS — I10 Essential (primary) hypertension: Secondary | ICD-10-CM

## 2023-11-08 ENCOUNTER — Other Ambulatory Visit: Payer: Self-pay | Admitting: Sports Medicine

## 2023-11-08 DIAGNOSIS — M170 Bilateral primary osteoarthritis of knee: Secondary | ICD-10-CM

## 2023-11-11 NOTE — Telephone Encounter (Signed)
 Emmitte needs to pick either the Celebrex or the naproxen, not both, let me know which 1 he would prefer.

## 2023-11-11 NOTE — Telephone Encounter (Signed)
 He states he doesn't need the naproxen.

## 2023-11-12 ENCOUNTER — Other Ambulatory Visit: Payer: Self-pay | Admitting: Family Medicine

## 2023-11-12 DIAGNOSIS — I10 Essential (primary) hypertension: Secondary | ICD-10-CM

## 2023-12-21 ENCOUNTER — Other Ambulatory Visit: Payer: Self-pay | Admitting: Sports Medicine

## 2023-12-27 ENCOUNTER — Other Ambulatory Visit: Payer: Self-pay | Admitting: Family Medicine

## 2023-12-27 DIAGNOSIS — I10 Essential (primary) hypertension: Secondary | ICD-10-CM

## 2023-12-29 ENCOUNTER — Other Ambulatory Visit: Payer: Self-pay | Admitting: Family Medicine

## 2023-12-29 DIAGNOSIS — I10 Essential (primary) hypertension: Secondary | ICD-10-CM

## 2023-12-29 NOTE — Telephone Encounter (Signed)
 Patient scheduled for 02/03/24 w/ Dr. Greer Leak, thanks.

## 2023-12-29 NOTE — Telephone Encounter (Signed)
 Pls contact the pt to schedule appt with Dr. Greer Leak.  Return in about 6 months (around 10/26/2023) for bp/ifg.  Last OV: 04/28/23

## 2024-01-23 ENCOUNTER — Other Ambulatory Visit: Payer: Self-pay | Admitting: Sports Medicine

## 2024-01-27 ENCOUNTER — Other Ambulatory Visit: Payer: Self-pay | Admitting: Family Medicine

## 2024-01-27 DIAGNOSIS — I10 Essential (primary) hypertension: Secondary | ICD-10-CM

## 2024-02-02 ENCOUNTER — Other Ambulatory Visit: Payer: Self-pay | Admitting: Family Medicine

## 2024-02-02 DIAGNOSIS — I10 Essential (primary) hypertension: Secondary | ICD-10-CM

## 2024-02-03 ENCOUNTER — Encounter: Payer: Self-pay | Admitting: Family Medicine

## 2024-02-03 ENCOUNTER — Ambulatory Visit (INDEPENDENT_AMBULATORY_CARE_PROVIDER_SITE_OTHER): Admitting: Family Medicine

## 2024-02-03 VITALS — BP 128/84 | HR 87 | Ht 66.0 in | Wt 211.1 lb

## 2024-02-03 DIAGNOSIS — I69328 Other speech and language deficits following cerebral infarction: Secondary | ICD-10-CM | POA: Insufficient documentation

## 2024-02-03 DIAGNOSIS — R3915 Urgency of urination: Secondary | ICD-10-CM | POA: Diagnosis not present

## 2024-02-03 DIAGNOSIS — I10 Essential (primary) hypertension: Secondary | ICD-10-CM

## 2024-02-03 DIAGNOSIS — R7301 Impaired fasting glucose: Secondary | ICD-10-CM

## 2024-02-03 DIAGNOSIS — N1831 Chronic kidney disease, stage 3a: Secondary | ICD-10-CM | POA: Diagnosis not present

## 2024-02-03 HISTORY — DX: Other speech and language deficits following cerebral infarction: I69.328

## 2024-02-03 MED ORDER — AMLODIPINE BESYLATE 10 MG PO TABS: 10.0000 mg | ORAL_TABLET | Freq: Every day | ORAL | 1 refills | Status: AC

## 2024-02-03 NOTE — Progress Notes (Signed)
   Established Patient Office Visit  Subjective  Patient ID: Jose Waller, male    DOB: 1951/03/04  Age: 73 y.o. MRN: [id number]  Chief Complaint  Patient presents with   Hypertension   ifg    HPI  Impaired fasting glucose-no increased thirst or urination. No symptoms consistent with hypoglycemia.  Hypertension- Pt denies chest pain, SOB, dizziness, or heart palpitations.  Taking meds as directed w/o problems.  Denies medication side effects.  Currently on norvasc  and Diovan .    He declines the pneumonia and shingles vaccines today.     ROS    Objective:     BP 128/84   Pulse 87   Ht 5' 6 (1.676 m)   Wt 211 lb 1.3 oz (95.7 kg)   SpO2 98%   BMI 34.07 kg/m    Physical Exam Vitals and nursing note reviewed.  Constitutional:      Appearance: Normal appearance.  HENT:     Head: Normocephalic and atraumatic.  Eyes:     Conjunctiva/sclera: Conjunctivae normal.  Cardiovascular:     Rate and Rhythm: Normal rate and regular rhythm.  Pulmonary:     Effort: Pulmonary effort is normal.     Breath sounds: Normal breath sounds.  Skin:    General: Skin is warm and dry.  Neurological:     Mental Status: He is alert.  Psychiatric:        Mood and Affect: Mood normal.      No results found for any visits on 02/03/24.    The 10-year ASCVD risk score (Arnett DK, et al., 2019) is: 21.6%    Assessment & Plan:   Problem List Items Addressed This Visit       Cardiovascular and Mediastinum   HYPERTENSION - Primary   Blood pressure was quite elevated but repeat looks fantastic so we will continue current regimen with amlodipine  and valsartan .  Refill sent to pharmacy recently.      Relevant Medications   amLODipine  (NORVASC ) 10 MG tablet   Other Relevant Orders   CMP14+EGFR   Urine Microalbumin w/creat. ratio   Hemoglobin A1c   Ambulatory referral to Ophthalmology     Endocrine   IFG (impaired fasting glucose)   Will check A1C on labs today.  Last 2 A1c's  have looked great so please continue to work on healthy diet and regular exercise.      Relevant Orders   CMP14+EGFR   Urine Microalbumin w/creat. ratio   Hemoglobin A1c   Ambulatory referral to Ophthalmology     Genitourinary   CKD stage G3a/A1, GFR 45-59 and albumin creatinine ratio <30 mg/g (HCC)   Due to recheck renal function and check for protein.        Relevant Orders   CMP14+EGFR   Urine Microalbumin w/creat. ratio   Hemoglobin A1c   Ambulatory referral to Ophthalmology     Other   Urinary urgency   Has tried oxybutynin , Enablex , Vesicare , and Myrbetriq  all are ineffectiv  Doing well with Gemtessa.       Speech and language deficit as late effect of stroke   Stable.      Relevant Orders   CMP14+EGFR   Urine Microalbumin w/creat. ratio   Hemoglobin A1c    Return in about 6 months (around 08/05/2024) for Hypertension, Pre-diabetes.    Dorothyann Byars, MD

## 2024-02-03 NOTE — Assessment & Plan Note (Addendum)
 Will check A1C on labs today.  Last 2 A1c's have looked great so please continue to work on healthy diet and regular exercise.

## 2024-02-03 NOTE — Assessment & Plan Note (Addendum)
 Blood pressure was quite elevated but repeat looks fantastic so we will continue current regimen with amlodipine  and valsartan .  Refill sent to pharmacy recently.

## 2024-02-03 NOTE — Patient Instructions (Signed)
 Encourage you to get your Shingles vaccine at the pharmacy.

## 2024-02-03 NOTE — Assessment & Plan Note (Signed)
 Has tried oxybutynin , Enablex , Vesicare , and Myrbetriq  all are ineffectiv  Doing well with Gemtessa.

## 2024-02-03 NOTE — Assessment & Plan Note (Signed)
 Due to recheck renal function and check for protein.

## 2024-02-03 NOTE — Assessment & Plan Note (Signed)
 Stable

## 2024-02-04 ENCOUNTER — Ambulatory Visit: Payer: Self-pay | Admitting: Family Medicine

## 2024-02-04 LAB — HEMOGLOBIN A1C
Est. average glucose Bld gHb Est-mCnc: 128 mg/dL
Hgb A1c MFr Bld: 6.1 % — ABNORMAL HIGH (ref 4.8–5.6)

## 2024-02-04 LAB — CMP14+EGFR
ALT: 16 IU/L (ref 0–44)
AST: 17 IU/L (ref 0–40)
Albumin: 4.7 g/dL (ref 3.8–4.8)
Alkaline Phosphatase: 92 IU/L (ref 44–121)
BUN/Creatinine Ratio: 8 — ABNORMAL LOW (ref 10–24)
BUN: 10 mg/dL (ref 8–27)
Bilirubin Total: 0.4 mg/dL (ref 0.0–1.2)
CO2: 19 mmol/L — ABNORMAL LOW (ref 20–29)
Calcium: 9.8 mg/dL (ref 8.6–10.2)
Chloride: 103 mmol/L (ref 96–106)
Creatinine, Ser: 1.31 mg/dL — ABNORMAL HIGH (ref 0.76–1.27)
Globulin, Total: 2.8 g/dL (ref 1.5–4.5)
Glucose: 88 mg/dL (ref 70–99)
Potassium: 4.5 mmol/L (ref 3.5–5.2)
Sodium: 141 mmol/L (ref 134–144)
Total Protein: 7.5 g/dL (ref 6.0–8.5)
eGFR: 57 mL/min/1.73 — ABNORMAL LOW (ref >59)

## 2024-02-04 LAB — MICROALBUMIN / CREATININE URINE RATIO
Creatinine, Urine: 71.7 mg/dL
Microalb/Creat Ratio: 20 mg/g{creat} (ref 0–29)
Microalbumin, Urine: 14.5 ug/mL

## 2024-02-04 NOTE — Progress Notes (Signed)
 HI Jose Waller, your kidney function is stable.  No excess protein in the urine which is good.  A1C is up alittle this itme. Cut back on sugars and carbs like bread, rice, pasta, potatoes.

## 2024-03-15 DIAGNOSIS — H02834 Dermatochalasis of left upper eyelid: Secondary | ICD-10-CM | POA: Diagnosis not present

## 2024-03-15 DIAGNOSIS — H40013 Open angle with borderline findings, low risk, bilateral: Secondary | ICD-10-CM | POA: Diagnosis not present

## 2024-03-15 DIAGNOSIS — H26493 Other secondary cataract, bilateral: Secondary | ICD-10-CM | POA: Diagnosis not present

## 2024-03-15 DIAGNOSIS — H11001 Unspecified pterygium of right eye: Secondary | ICD-10-CM | POA: Diagnosis not present

## 2024-03-15 DIAGNOSIS — H02831 Dermatochalasis of right upper eyelid: Secondary | ICD-10-CM | POA: Diagnosis not present

## 2024-03-15 DIAGNOSIS — Z961 Presence of intraocular lens: Secondary | ICD-10-CM | POA: Diagnosis not present

## 2024-03-15 DIAGNOSIS — H526 Other disorders of refraction: Secondary | ICD-10-CM | POA: Diagnosis not present

## 2024-03-17 ENCOUNTER — Other Ambulatory Visit: Payer: Self-pay | Admitting: Sports Medicine

## 2024-03-30 ENCOUNTER — Encounter: Payer: Self-pay | Admitting: Sports Medicine

## 2024-04-18 ENCOUNTER — Other Ambulatory Visit: Payer: Self-pay | Admitting: Family Medicine

## 2024-04-18 DIAGNOSIS — K219 Gastro-esophageal reflux disease without esophagitis: Secondary | ICD-10-CM

## 2024-04-27 DIAGNOSIS — M1712 Unilateral primary osteoarthritis, left knee: Secondary | ICD-10-CM | POA: Diagnosis not present

## 2024-04-27 DIAGNOSIS — M25662 Stiffness of left knee, not elsewhere classified: Secondary | ICD-10-CM | POA: Diagnosis not present

## 2024-04-27 DIAGNOSIS — M25562 Pain in left knee: Secondary | ICD-10-CM | POA: Diagnosis not present

## 2024-04-27 DIAGNOSIS — R262 Difficulty in walking, not elsewhere classified: Secondary | ICD-10-CM | POA: Diagnosis not present

## 2024-05-11 ENCOUNTER — Other Ambulatory Visit: Payer: Self-pay | Admitting: *Deleted

## 2024-05-11 MED ORDER — CELECOXIB 200 MG PO CAPS: ORAL_CAPSULE | ORAL | 1 refills | Status: DC

## 2024-05-12 ENCOUNTER — Other Ambulatory Visit: Payer: Self-pay | Admitting: Family Medicine

## 2024-05-12 DIAGNOSIS — I10 Essential (primary) hypertension: Secondary | ICD-10-CM

## 2024-05-13 ENCOUNTER — Encounter

## 2024-05-31 ENCOUNTER — Telehealth: Payer: Self-pay | Admitting: Family Medicine

## 2024-06-01 ENCOUNTER — Ambulatory Visit: Admitting: Family Medicine

## 2024-06-02 ENCOUNTER — Ambulatory Visit (INDEPENDENT_AMBULATORY_CARE_PROVIDER_SITE_OTHER): Admitting: Family Medicine

## 2024-06-02 ENCOUNTER — Ambulatory Visit

## 2024-06-02 ENCOUNTER — Encounter: Payer: Self-pay | Admitting: Family Medicine

## 2024-06-02 VITALS — BP 121/81 | HR 97 | Ht 66.0 in | Wt 208.0 lb

## 2024-06-02 DIAGNOSIS — I10 Essential (primary) hypertension: Secondary | ICD-10-CM

## 2024-06-02 DIAGNOSIS — N1831 Chronic kidney disease, stage 3a: Secondary | ICD-10-CM

## 2024-06-02 DIAGNOSIS — R079 Chest pain, unspecified: Secondary | ICD-10-CM

## 2024-06-02 DIAGNOSIS — R7301 Impaired fasting glucose: Secondary | ICD-10-CM

## 2024-06-02 DIAGNOSIS — R0789 Other chest pain: Secondary | ICD-10-CM | POA: Diagnosis not present

## 2024-06-02 DIAGNOSIS — E785 Hyperlipidemia, unspecified: Secondary | ICD-10-CM

## 2024-06-02 NOTE — Progress Notes (Signed)
 Acute Office Visit  Patient ID: Jose Waller, male    DOB: [birth year], 73 y.o.   MRN: [id number]  PCP: Alvan Dorothyann BIRCH, MD  Chief Complaint  Patient presents with   Shortness of Breath    Subjective:     HPI  Discussed the use of AI scribe software for clinical note transcription with the patient, who gave verbal consent to proceed.  History of Present Illness Jose Waller is a 73 year old male who presents with a burning sensation in the chest.  Chest discomfort - Burning sensation in the middle of the chest for the past 2-3 weeks - Primarily occurs in the morning when going to his truck - Short-lived episodes, resolving after deep breaths or stretching the chest - No associated cough, congestion, recent cold, or heavy lifting - No heartburn, excessive burping, or gastrointestinal symptoms - No prolonged pain or pressure in the chest  Hypertension - Takes a daily tablet for high blood pressure  Pulmonary history - Last chest x-ray five years ago showed scar tissue at the base of the lungs  Cerebrovascular disease - History of stroke approximately five years ago   ROS     Objective:    BP 121/81   Pulse 97   Ht 5' 6 (1.676 m)   Wt 208 lb (94.3 kg)   SpO2 96%   BMI 33.57 kg/m     Physical Exam Vitals and nursing note reviewed.  Constitutional:      Appearance: Normal appearance.  HENT:     Head: Normocephalic and atraumatic.  Eyes:     Conjunctiva/sclera: Conjunctivae normal.  Cardiovascular:     Rate and Rhythm: Normal rate and regular rhythm.  Pulmonary:     Effort: Pulmonary effort is normal.     Breath sounds: Normal breath sounds.  Skin:    General: Skin is warm and dry.  Neurological:     Mental Status: He is alert.  Psychiatric:        Mood and Affect: Mood normal.       No results found for any visits on 06/02/24.     Assessment & Plan:   Problem List Items Addressed This Visit       Cardiovascular and Mediastinum    HYPERTENSION - Primary   Relevant Orders   DG Chest 2 View   CMP14+EGFR   CBC with Differential/Platelet   Lipid panel   Hemoglobin A1c     Endocrine   IFG (impaired fasting glucose)   Relevant Orders   Hemoglobin A1c     Genitourinary   CKD stage G3a/A1, GFR 45-59 and albumin creatinine ratio <30 mg/g (HCC)   Relevant Orders   Urine Microalbumin w/creat. ratio     Other   Hyperlipidemia   Due for updated lipoid panel.       Other Visit Diagnoses       Chest pain, unspecified type       Relevant Orders   DG Chest 2 View   CMP14+EGFR   CBC with Differential/Platelet   Lipid panel   Hemoglobin A1c       Assessment and Plan Assessment & Plan Chest discomfort (burning sensation) - atypical  Intermittent chest burning for three weeks, primarily in the morning. Differential includes anxiety, lung issues, and cardiac etiology. Previous x-ray showed lung base scar tissue. Symptoms may be stress-related. - no recent GERD sxs or heavy lifting.  - Ordered chest x-ray to evaluate for changes since last imaging. -  Advised to report changes in symptoms, such as increased duration, pressure, or squeezing sensation, indicating possible cardiac issue. - will get labs to eval for anemia, etc.      No orders of the defined types were placed in this encounter.   Return in about 6 months (around 11/30/2024) for Hypertension.  Dorothyann Byars, MD [facility] Primary Care & Sports Medicine at [facility]

## 2024-06-02 NOTE — Assessment & Plan Note (Signed)
 Due for updated lipoid panel.

## 2024-06-03 LAB — CBC WITH DIFFERENTIAL/PLATELET
Basophils Absolute: 0.1 x10E3/uL (ref 0.0–0.2)
Basos: 1 %
EOS (ABSOLUTE): 0.3 x10E3/uL (ref 0.0–0.4)
Eos: 2 %
Hematocrit: 48 % (ref 37.5–51.0)
Hemoglobin: 15.9 g/dL (ref 13.0–17.7)
Immature Grans (Abs): 0 x10E3/uL (ref 0.0–0.1)
Immature Granulocytes: 0 %
Lymphocytes Absolute: 2.6 x10E3/uL (ref 0.7–3.1)
Lymphs: 25 %
MCH: 29.3 pg (ref 26.6–33.0)
MCHC: 33.1 g/dL (ref 31.5–35.7)
MCV: 89 fL (ref 79–97)
Monocytes Absolute: 0.8 x10E3/uL (ref 0.1–0.9)
Monocytes: 8 %
Neutrophils Absolute: 6.8 x10E3/uL (ref 1.4–7.0)
Neutrophils: 64 %
Platelets: 376 x10E3/uL (ref 150–450)
RBC: 5.42 x10E6/uL (ref 4.14–5.80)
RDW: 14.1 % (ref 11.6–15.4)
WBC: 10.6 x10E3/uL (ref 3.4–10.8)

## 2024-06-03 LAB — LIPID PANEL
Chol/HDL Ratio: 3.6 ratio (ref 0.0–5.0)
Cholesterol, Total: 139 mg/dL (ref 100–199)
HDL: 39 mg/dL — ABNORMAL LOW (ref >39)
LDL Chol Calc (NIH): 82 mg/dL (ref 0–99)
Triglycerides: 95 mg/dL (ref 0–149)
VLDL Cholesterol Cal: 18 mg/dL (ref 5–40)

## 2024-06-03 LAB — CMP14+EGFR
ALT: 11 IU/L (ref 0–44)
AST: 15 IU/L (ref 0–40)
Albumin: 4.6 g/dL (ref 3.8–4.8)
Alkaline Phosphatase: 100 IU/L (ref 47–123)
BUN/Creatinine Ratio: 11 (ref 10–24)
BUN: 16 mg/dL (ref 8–27)
Bilirubin Total: 0.6 mg/dL (ref 0.0–1.2)
CO2: 20 mmol/L (ref 20–29)
Calcium: 9.8 mg/dL (ref 8.6–10.2)
Chloride: 106 mmol/L (ref 96–106)
Creatinine, Ser: 1.51 mg/dL — ABNORMAL HIGH (ref 0.76–1.27)
Globulin, Total: 2.9 g/dL (ref 1.5–4.5)
Glucose: 92 mg/dL (ref 70–99)
Potassium: 4.4 mmol/L (ref 3.5–5.2)
Sodium: 140 mmol/L (ref 134–144)
Total Protein: 7.5 g/dL (ref 6.0–8.5)
eGFR: 48 mL/min/1.73 — ABNORMAL LOW (ref >59)

## 2024-06-03 LAB — HEMOGLOBIN A1C
Est. average glucose Bld gHb Est-mCnc: 128 mg/dL
Hgb A1c MFr Bld: 6.1 % — ABNORMAL HIGH (ref 4.8–5.6)

## 2024-06-03 LAB — MICROALBUMIN / CREATININE URINE RATIO
Creatinine, Urine: 150 mg/dL
Microalb/Creat Ratio: 11 mg/g{creat} (ref 0–29)
Microalbumin, Urine: 16 ug/mL

## 2024-06-07 ENCOUNTER — Ambulatory Visit: Payer: Self-pay | Admitting: Family Medicine

## 2024-06-07 DIAGNOSIS — R7989 Other specified abnormal findings of blood chemistry: Secondary | ICD-10-CM

## 2024-06-07 DIAGNOSIS — R079 Chest pain, unspecified: Secondary | ICD-10-CM

## 2024-06-07 NOTE — Progress Notes (Signed)
 Hi Jose Waller, your chest xray looks good I do not see anything concerning in the lungs.  I think and like to get you in cardiology to get your heart checked out would you be okay with that?

## 2024-06-07 NOTE — Progress Notes (Signed)
 Hi Barclay, kidney function went up slightly normally you are around 1.3-1.4 and it was up to 1.5 this time I want to recheck that in 3 to 4 weeks.  Liver enzymes look good.  Cholesterol looks better this time compared to last year so great work.  Continue to work on the [employer].  A1c is 6.1 so stable.  No excess protein in the urine which is great.  And blood count looks normal.

## 2024-06-29 ENCOUNTER — Ambulatory Visit: Admitting: Family Medicine

## 2024-07-04 ENCOUNTER — Other Ambulatory Visit: Payer: Self-pay | Admitting: Family Medicine

## 2024-07-10 ENCOUNTER — Other Ambulatory Visit: Payer: Self-pay | Admitting: Family Medicine

## 2024-07-10 DIAGNOSIS — E785 Hyperlipidemia, unspecified: Secondary | ICD-10-CM

## 2024-07-16 ENCOUNTER — Other Ambulatory Visit: Payer: Self-pay | Admitting: Family Medicine

## 2024-07-16 DIAGNOSIS — K219 Gastro-esophageal reflux disease without esophagitis: Secondary | ICD-10-CM

## 2024-07-30 DIAGNOSIS — T7840XA Allergy, unspecified, initial encounter: Secondary | ICD-10-CM | POA: Insufficient documentation

## 2024-07-30 DIAGNOSIS — H409 Unspecified glaucoma: Secondary | ICD-10-CM | POA: Insufficient documentation

## 2024-07-30 DIAGNOSIS — M1990 Unspecified osteoarthritis, unspecified site: Secondary | ICD-10-CM | POA: Insufficient documentation

## 2024-07-30 DIAGNOSIS — I10 Essential (primary) hypertension: Secondary | ICD-10-CM | POA: Insufficient documentation

## 2024-07-30 DIAGNOSIS — F1911 Other psychoactive substance abuse, in remission: Secondary | ICD-10-CM | POA: Insufficient documentation

## 2024-07-30 DIAGNOSIS — G44009 Cluster headache syndrome, unspecified, not intractable: Secondary | ICD-10-CM | POA: Insufficient documentation

## 2024-07-30 DIAGNOSIS — F1910 Other psychoactive substance abuse, uncomplicated: Secondary | ICD-10-CM | POA: Insufficient documentation

## 2024-07-30 DIAGNOSIS — H269 Unspecified cataract: Secondary | ICD-10-CM | POA: Insufficient documentation

## 2024-08-04 ENCOUNTER — Ambulatory Visit: Attending: Cardiology | Admitting: Cardiology

## 2024-08-04 ENCOUNTER — Encounter: Payer: Self-pay | Admitting: Cardiology

## 2024-08-04 VITALS — BP 120/82 | HR 96 | Ht 66.0 in | Wt 209.0 lb

## 2024-08-04 DIAGNOSIS — R0609 Other forms of dyspnea: Secondary | ICD-10-CM

## 2024-08-04 DIAGNOSIS — N289 Disorder of kidney and ureter, unspecified: Secondary | ICD-10-CM | POA: Diagnosis not present

## 2024-08-04 DIAGNOSIS — I10 Essential (primary) hypertension: Secondary | ICD-10-CM | POA: Insufficient documentation

## 2024-08-04 DIAGNOSIS — E66811 Obesity, class 1: Secondary | ICD-10-CM | POA: Diagnosis not present

## 2024-08-04 DIAGNOSIS — I209 Angina pectoris, unspecified: Secondary | ICD-10-CM | POA: Diagnosis not present

## 2024-08-04 DIAGNOSIS — I259 Chronic ischemic heart disease, unspecified: Secondary | ICD-10-CM | POA: Diagnosis not present

## 2024-08-04 DIAGNOSIS — E785 Hyperlipidemia, unspecified: Secondary | ICD-10-CM | POA: Diagnosis not present

## 2024-08-04 MED ORDER — ASPIRIN 81 MG PO TBEC: 81.0000 mg | DELAYED_RELEASE_TABLET | Freq: Every day | ORAL | 3 refills | Status: AC

## 2024-08-04 MED ORDER — NITROGLYCERIN 0.4 MG SL SUBL: 0.4000 mg | SUBLINGUAL_TABLET | SUBLINGUAL | 6 refills | Status: AC | PRN

## 2024-08-04 MED ORDER — ASPIRIN 81 MG PO TBEC: 81.0000 mg | DELAYED_RELEASE_TABLET | Freq: Every day | ORAL | Status: DC

## 2024-08-04 NOTE — Patient Instructions (Signed)
 Medication Instructions:  Your physician has recommended you make the following change in your medication:   Start taking 81 mg coated aspirin  daily  Use nitroglycerin  1 tablet placed under the tongue at the first sign of chest pain or an angina attack. 1 tablet may be used every 5 minutes as needed, for up to 15 minutes. Do not take more than 3 tablets in 15 minutes. If pain persist call 911 or go to the nearest ED.   *If you need a refill on your cardiac medications before your next appointment, please call your pharmacy*   Lab Work: None ordered If you have labs (blood work) drawn today and your tests are completely normal, you will receive your results only by: MyChart Message (if you have MyChart) OR A paper copy in the mail If you have any lab test that is abnormal or we need to change your treatment, we will call you to review the results.   Testing/Procedures:   [facility] at [facility] [address] [place], [place] [address] Phone: [phone]  August 04, 2024    Jose Waller DOB: 1950/10/01 MRN: [id number] [address] [place] [address]   Dear Jose Waller,  Please arrive 15 minutes prior to your appointment time for registration and insurance purposes.  The test will take approximately 3 to 4 hours to complete; you may bring reading material.  If someone comes with you to your appointment, they will need to remain in the main lobby due to limited space in the testing area.   How to prepare for your Myocardial Perfusion Test: Do not eat or drink 3 hours prior to your test, except you may have water. Do not consume products containing caffeine (regular or decaffeinated) 12 hours prior to your test. (ex: coffee, chocolate, sodas, tea). Do bring a list of your current medications with you.  If not listed below, you may take your medications as normal. Do wear comfortable clothes (no dresses or overalls) and walking  shoes, tennis shoes preferred (No heels or open toe shoes are allowed). Do NOT wear cologne, perfume, aftershave, or lotions (deodorant is allowed). If these instructions are not followed, your test will have to be rescheduled.  Please report to [address] ([facility]), 2nd Floor, for your test.  If you have questions or concerns about your appointment, you can call the Nuclear Lab at [phone].  If you cannot keep your appointment, please provide 24 hours notification to the Nuclear Lab, to avoid a possible $50 charge to your account.  Your physician has requested that you have an echocardiogram. Echocardiography is a painless test that uses sound waves to create images of your heart. It provides your doctor with information about the size and shape of your heart and how well your hearts chambers and valves are working. This procedure takes approximately one hour. There are no restrictions for this procedure. Please do NOT wear cologne, perfume, aftershave, or lotions (deodorant is allowed). Please arrive 15 minutes prior to your appointment time.  Please note: We ask at that you not bring children with you during ultrasound (echo/ vascular) testing. Due to room size and safety concerns, children are not allowed in the ultrasound rooms during exams. Our front office staff cannot provide observation of children in our lobby area while testing is being conducted. An adult accompanying a patient to their appointment will only be allowed in the ultrasound room  at the discretion of the ultrasound technician under special circumstances. We apologize for any inconvenience.    Follow-Up: At [facility], you and your health needs are our priority.  As part of our continuing mission to provide you with exceptional heart care, we have created designated Provider Care Teams.  These Care Teams include your primary Cardiologist (physician) and  Advanced Practice Providers (APPs -  Physician Assistants and Nurse Practitioners) who all work together to provide you with the care you need, when you need it.  We recommend signing up for the patient portal called MyChart.  Sign up information is provided on this After Visit Summary.  MyChart is used to connect with patients for Virtual Visits (Telemedicine).  Patients are able to view lab/test results, encounter notes, upcoming appointments, etc.  Non-urgent messages can be sent to your provider as well.   To learn more about what you can do with MyChart, go to [email or web address].    Your next appointment:   6 month(s)  Provider:   Jennifer Crape, MD   Other Instructions  Cardiac Nuclear Scan A cardiac nuclear scan is a test that is done to check the flow of blood to your heart. It is done when you are resting and when you are exercising. The test looks for problems such as: Not enough blood reaching a portion of the heart. The heart muscle not working as it should. You may need this test if you have: Heart disease. Lab results that are not normal. Had heart surgery or a balloon procedure to open up blocked arteries (angioplasty) or a small mesh tube (stent). Chest pain. Shortness of breath. Had a heart attack. In this test, a special dye (tracer) is put into your bloodstream. The tracer will travel to your heart. A camera will then take pictures of your heart to see how the tracer moves through your heart. This test is usually done at a hospital and takes 2-4 hours. Tell a doctor about: Any allergies you have. All medicines you are taking, including vitamins, herbs, eye drops, creams, and over-the-counter medicines. Any bleeding problems you have. Any surgeries you have had. Any medical conditions you have. Whether you are pregnant or may be pregnant. Any history of asthma or long-term (chronic) lung disease. Any history of heart rhythm disorders or heart valve  conditions. What are the risks? Your doctor will talk with you about risks. These may include: Serious chest pain and heart attack. This is only a risk if the stress portion of the test is done. Fast or uneven heartbeats (palpitations). A feeling of warmth in your chest. This feeling usually does not last long. Allergic reaction to the tracer. Shortness of breath or trouble breathing. What happens before the test? Ask your doctor about changing or stopping your normal medicines. Follow instructions from your doctor about what you cannot eat or drink. Remove your jewelry on the day of the test. Ask your doctor if you need to avoid nicotine or caffeine. What happens during the test? An IV tube will be inserted into one of your veins. Your doctor will give you a small amount of tracer through the IV tube. You will wait for 20-40 minutes while the tracer moves through your bloodstream. Your heart will be monitored with an electrocardiogram (ECG). You will lie down on an exam table. Pictures of your heart will be taken for about 15-20 minutes. You may also have a stress test. For this test, one of these things may be  done: You will be asked to exercise on a treadmill or a stationary bike. You will be given medicines that will make your heart work harder. This is done if you are unable to exercise. When blood flow to your heart has peaked, a tracer will again be given through the IV tube. After 20-40 minutes, you will get back on the exam table. More pictures will be taken of your heart. Depending on the tracer that is used, more pictures may need to be taken 3-4 hours later. Your IV tube will be removed when the test is over. The test may vary among doctors and hospitals. What happens after the test? Ask your doctor: Whether you can return to your normal schedule, including diet, activities, travel, and medicines. Whether you should drink more fluids. This will help to remove the tracer  from your body. Ask your doctor, or the department that is doing the test: When will my results be ready? How will I get my results? What are my treatment options? What other tests do I need? What are my next steps? This information is not intended to replace advice given to you by your health care provider. Make sure you discuss any questions you have with your health care provider. Document Revised: 12/11/2021 Document Reviewed: 12/11/2021 Elsevier Patient Education  2023 Elsevier Inc.  Echocardiogram An echocardiogram is a test that uses sound waves (ultrasound) to produce images of the heart. Images from an echocardiogram can provide important information about: Heart size and shape. The size and thickness and movement of your heart's walls. Heart muscle function and strength. Heart valve function or if you have stenosis. Stenosis is when the heart valves are too narrow. If blood is flowing backward through the heart valves (regurgitation). A tumor or infectious growth around the heart valves. Areas of heart muscle that are not working well because of poor blood flow or injury from a heart attack. Aneurysm detection. An aneurysm is a weak or damaged part of an artery wall. The wall bulges out from the normal force of blood pumping through the body. Tell a health care provider about: Any allergies you have. All medicines you are taking, including vitamins, herbs, eye drops, creams, and over-the-counter medicines. Any blood disorders you have. Any surgeries you have had. Any medical conditions you have. Whether you are pregnant or may be pregnant. What are the risks? Generally, this is a safe test. However, problems may occur, including an allergic reaction to dye (contrast) that may be used during the test. What happens before the test? No specific preparation is needed. You may eat and drink normally. What happens during the test?  You will take off your clothes from the waist  up and put on a hospital gown. Electrodes or electrocardiogram (ECG)patches may be placed on your chest. The electrodes or patches are then connected to a device that monitors your heart rate and rhythm. You will lie down on a table for an ultrasound exam. A gel will be applied to your chest to help sound waves pass through your skin. A handheld device, called a transducer, will be pressed against your chest and moved over your heart. The transducer produces sound waves that travel to your heart and bounce back (or echo back) to the transducer. These sound waves will be captured in real-time and changed into images of your heart that can be viewed on a video monitor. The images will be recorded on a computer and reviewed by your health care provider.  You may be asked to change positions or hold your breath for a short time. This makes it easier to get different views or better views of your heart. In some cases, you may receive contrast through an IV in one of your veins. This can improve the quality of the pictures from your heart. The procedure may vary among health care providers and hospitals. What can I expect after the test? You may return to your normal, everyday life, including diet, activities, and medicines, unless your health care provider tells you not to do that. Follow these instructions at home: It is up to you to get the results of your test. Ask your health care provider, or the department that is doing the test, when your results will be ready. Keep all follow-up visits. This is important. Summary An echocardiogram is a test that uses sound waves (ultrasound) to produce images of the heart. Images from an echocardiogram can provide important information about the size and shape of your heart, heart muscle function, heart valve function, and other possible heart problems. You do not need to do anything to prepare before this test. You may eat and drink normally. After the  echocardiogram is completed, you may return to your normal, everyday life, unless your health care provider tells you not to do that. This information is not intended to replace advice given to you by your health care provider. Make sure you discuss any questions you have with your health care provider. Document Revised: 03/28/2021 Document Reviewed: 03/07/2020 Elsevier Patient Education  2023 Elsevier Inc.    Important Information About Sugar

## 2024-08-04 NOTE — Progress Notes (Signed)
 " Cardiology Office Note:    Date:  08/04/2024   ID:  Jose Waller, DOB 1950-10-20, MRN [id number]  PCP:  Alvan Dorothyann BIRCH, MD  Cardiologist:  Jennifer JONELLE Crape, MD   Referring MD: Alvan Dorothyann BIRCH, MD    ASSESSMENT:    1. Hyperlipidemia, unspecified hyperlipidemia type   2. Essential hypertension   3. Renal insufficiency   4. Obesity (BMI 30.0-34.9)   5. Angina pectoris   6. DOE (dyspnea on exertion)    PLAN:    In order of problems listed above:  Primary prevention stressed with the patient.  Importance of compliance with diet medication stressed and patient verbalized standing. Essential hypertension: Blood pressure is stable and diet was emphasized.  Lifestyle modification urged.  Salt intake issues were discussed. Mixed dyslipidemia: On lipid-lowering medications followed by primary care.  Lipids reviewed and discussed. Dyspnea on exertion and chest pain: I offered modalities for evaluation such as stress testing, CT coronary angiography and conventional coronary angiography.  Benefits and potential risks of each were discussed and he opts for exercise stress nuclear testing. Cardiac murmur: Echocardiogram will be done to assess murmur heard on auscultation. Renal insufficiency: Stable at this time and patient is aware. Further recommendations will be made based on the findings of the aforementioned test. In the interim he was told to take a coated baby aspirin .  Sublingual nitroglycerin  prescription was sent, its protocol and 911 protocol explained and the patient vocalized understanding questions were answered to the patient's satisfaction Patient will be seen in follow-up appointment in 6 months or earlier if the patient has any concerns.    Medication Adjustments/Labs and Tests Ordered: Current medicines are reviewed at length with the patient today.  Concerns regarding medicines are outlined above.  Orders Placed This Encounter  Procedures   EKG 12-Lead   No  orders of the defined types were placed in this encounter.    History of Present Illness:    Jose Waller is a 74 y.o. male who is being seen today for the evaluation of dyspnea on exertion and chest pain at the request of Alvan Dorothyann BIRCH, MD. patient is a pleasant 74 year old male.  He has past medical history of essential hypertension, mixed dyslipidemia and renal insufficiency.  He mentions to me that he is having chest tightness on exertion and dyspnea.  No orthopnea or PND.  He is concerned about it.  So he is here for evaluation.  Overall he is active but does not exercise on a regular basis.  At the time of my evaluation, the patient is alert awake oriented and in no distress.  Past Medical History:  Diagnosis Date   Allergy    Anorexia 02/09/2020   Arthritis    Cataract    MD just watching   Chronic pain of left knee 02/21/2022   CKD (chronic kidney disease) stage 3, GFR 30-59 ml/min (HCC) 06/05/2018   CKD stage G3a/A1, GFR 45-59 and albumin creatinine ratio <30 mg/g (HCC) 06/05/2018   Cluster headache    resolved   Combined forms of age-related cataract of left eye 09/23/2019   Erectile dysfunction 04/23/2021   GERD 11/08/2009   Qualifier: Diagnosis of   By: Alvan MD, Catherine         Glaucoma    Hyperlipidemia 12/31/2010   HYPERTENSION 04/22/2008   Qualifier: Diagnosis of   By: Merrell CMA, (AAMA), Andrea         Hypertension    IFG (impaired fasting glucose)  10/23/2020   Low back pain 09/08/2013   Disability since 2005     Migraine 09/08/2013   OAB (overactive bladder) 10/20/2020   Tried Vesicare  andMyrbetriq.      Primary osteoarthritis of both knees 04/22/2019   Slurred speech 01/28/2019   Speech and language deficit as late effect of stroke 02/03/2024   Substance abuse (HCC)    cocaine - quit 1998   Urinary urgency 01/28/2019   Has tried oxybutynin , Enablex , Vesicare , and Myrbetriq  all are ineffective.      Past Surgical History:  Procedure  Laterality Date   back lumbar  2004   fusion    COLONOSCOPY  06/09/2009   Dr Norleen Law - normal   WISDOM TOOTH EXTRACTION      Current Medications: Active Medications[1]   Allergies:   Prednisone   Social History   Socioeconomic History   Marital status: Widowed    Spouse name: Not on file   Number of children: 6   Years of education: 69   Highest education level: 12th grade  Occupational History   Occupation: retired    Comment: technical sales engineer  Tobacco Use   Smoking status: Former    Current packs/day: 0.00    Types: Cigarettes    Quit date: 07/29/2001    Years since quitting: 23.0   Smokeless tobacco: Never  Vaping Use   Vaping status: Never Used  Substance and Sexual Activity   Alcohol use: No   Drug use: Not Currently    Comment: Former cocaine user, quit 1998   Sexual activity: Yes  Other Topics Concern   Not on file  Social History Narrative   On disability. Wife passed away in a motor vehicle accident. He has six children. He enjoys going to the flea market.   Social Drivers of Health   Tobacco Use: Medium Risk (08/04/2024)   Patient History    Smoking Tobacco Use: Former    Smokeless Tobacco Use: Never    Passive Exposure: Not on file  Financial Resource Strain: Low Risk (05/06/2023)   Overall Financial Resource Strain (CARDIA)    Difficulty of Paying Living Expenses: Not hard at all  Food Insecurity: No Food Insecurity (05/06/2023)   Hunger Vital Sign    Worried About Running Out of Food in the Last Year: Never true    Ran Out of Food in the Last Year: Never true  Transportation Needs: No Transportation Needs (05/06/2023)   PRAPARE - Administrator, Civil Service (Medical): No    Lack of Transportation (Non-Medical): No  Physical Activity: Inactive (05/06/2023)   Exercise Vital Sign    Days of Exercise per Week: 0 days    Minutes of Exercise per Session: 0 min  Stress: No Stress Concern Present (05/06/2023)   [employer] of  Occupational Health - Occupational Stress Questionnaire    Feeling of Stress : Not at all  Social Connections: Moderately Integrated (05/06/2023)   Social Connection and Isolation Panel    Frequency of Communication with Friends and Family: More than three times a week    Frequency of Social Gatherings with Friends and Family: Once a week    Attends Religious Services: More than 4 times per year    Active Member of [employer] or Organizations: Yes    Attends Banker Meetings: Never    Marital Status: Widowed  Depression (PHQ2-9): Low Risk (06/02/2024)   Depression (PHQ2-9)    PHQ-2 Score: 0  Alcohol Screen: Low Risk (  05/06/2023)   Alcohol Screen    Last Alcohol Screening Score (AUDIT): 0  Housing: Low Risk (05/06/2023)   Housing    Last Housing Risk Score: 0  Utilities: Not At Risk (05/06/2023)   AHC Utilities    Threatened with loss of utilities: No  Health Literacy: Adequate Health Literacy (05/06/2023)   B1300 Health Literacy    Frequency of need for help with medical instructions: Never     Family History: The patient's family history includes Alcohol abuse in an other family member; Coronary artery disease in his brother; Dementia in his mother; Hypertension in his brother; Lung cancer in his father. There is no history of Colon cancer, Stomach cancer, or Rectal cancer.  ROS:   Please see the history of present illness.    All other systems reviewed and are negative.  EKGs/Labs/Other Studies Reviewed:    The following studies were reviewed today: EKG reveals sinus rhythm inferior and anterior poor forces suggesting of possible old myocardial infarction         Recent Labs: 06/02/2024: ALT 11; BUN 16; Creatinine, Ser 1.51; Hemoglobin 15.9; Platelets 376; Potassium 4.4; Sodium 140  Recent Lipid Panel    Component Value Date/Time   CHOL 139 06/02/2024 1108   TRIG 95 06/02/2024 1108   HDL 39 (L) 06/02/2024 1108   CHOLHDL 3.6 06/02/2024 1108   CHOLHDL 3.7  04/25/2022 0000   VLDL 18 05/15/2016 0851   LDLCALC 82 06/02/2024 1108   LDLCALC 97 04/25/2022 0000    Physical Exam:    VS:  BP 120/82   Pulse 96   Ht 5' 6 (1.676 m)   Wt 209 lb (94.8 kg)   SpO2 94%   BMI 33.73 kg/m     Wt Readings from Last 3 Encounters:  08/04/24 209 lb (94.8 kg)  06/02/24 208 lb (94.3 kg)  02/03/24 211 lb 1.3 oz (95.7 kg)     GEN: Patient is in no acute distress HEENT: Normal NECK: No JVD; No carotid bruits LYMPHATICS: No lymphadenopathy CARDIAC: S1 S2 regular, 2/6 systolic murmur at the apex. RESPIRATORY:  Clear to auscultation without rales, wheezing or rhonchi  ABDOMEN: Soft, non-tender, non-distended MUSCULOSKELETAL:  No edema; No deformity  SKIN: Warm and dry NEUROLOGIC:  Alert and oriented x 3 PSYCHIATRIC:  Normal affect    Signed, Jennifer JONELLE Crape, MD  08/04/2024 9:17 AM    [facility]      [1]  Current Meds  Medication Sig   amLODipine  (NORVASC ) 10 MG tablet Take 1 tablet (10 mg total) by mouth daily.   atorvastatin  (LIPITOR) 20 MG tablet TAKE 1 TABLET BY MOUTH AT BEDTIME   celecoxib  (CELEBREX ) 200 MG capsule TAKE 1 TO 2 CAPSULES BY MOUTH ONCE DAILY AS NEEDED FOR PAIN   GEMTESA 75 MG TABS Take 1 tablet by mouth daily.   omeprazole  (PRILOSEC) 40 MG capsule Take 1 capsule by mouth once daily   sildenafil  (REVATIO ) 20 MG tablet Take 1-5 tablets (20-100 mg total) by mouth as needed.   [DISCONTINUED] valsartan  (DIOVAN ) 320 MG tablet Take 1 tablet by mouth once daily   "

## 2024-08-05 ENCOUNTER — Ambulatory Visit (INDEPENDENT_AMBULATORY_CARE_PROVIDER_SITE_OTHER)

## 2024-08-05 ENCOUNTER — Encounter: Payer: Self-pay | Admitting: Family Medicine

## 2024-08-05 ENCOUNTER — Ambulatory Visit (INDEPENDENT_AMBULATORY_CARE_PROVIDER_SITE_OTHER): Admitting: Family Medicine

## 2024-08-05 VITALS — BP 128/78 | HR 91 | Ht 66.0 in | Wt 208.0 lb

## 2024-08-05 DIAGNOSIS — R7301 Impaired fasting glucose: Secondary | ICD-10-CM | POA: Diagnosis not present

## 2024-08-05 DIAGNOSIS — N1831 Chronic kidney disease, stage 3a: Secondary | ICD-10-CM | POA: Diagnosis not present

## 2024-08-05 DIAGNOSIS — I69328 Other speech and language deficits following cerebral infarction: Secondary | ICD-10-CM | POA: Diagnosis not present

## 2024-08-05 DIAGNOSIS — N3281 Overactive bladder: Secondary | ICD-10-CM

## 2024-08-05 DIAGNOSIS — Z0000 Encounter for general adult medical examination without abnormal findings: Secondary | ICD-10-CM | POA: Diagnosis not present

## 2024-08-05 DIAGNOSIS — N529 Male erectile dysfunction, unspecified: Secondary | ICD-10-CM | POA: Diagnosis not present

## 2024-08-05 DIAGNOSIS — F1911 Other psychoactive substance abuse, in remission: Secondary | ICD-10-CM

## 2024-08-05 DIAGNOSIS — I10 Essential (primary) hypertension: Secondary | ICD-10-CM

## 2024-08-05 MED ORDER — SILDENAFIL CITRATE 100 MG PO TABS: 50.0000 mg | ORAL_TABLET | Freq: Every day | ORAL | 6 refills | Status: AC | PRN

## 2024-08-05 NOTE — Progress Notes (Signed)
 "  Established Patient Office Visit  Patient ID: Jose Waller, male    DOB: 01/10/1951  Age: 74 y.o. MRN: [id number] PCP: Alvan Dorothyann BIRCH, MD  Chief Complaint  Patient presents with   Hypertension    Subjective:     HPI  Discussed the use of AI scribe software for clinical note transcription with the patient, who gave verbal consent to proceed.  History of Present Illness Jose Waller is a 74 year old male who presents for medication management and follow-up on previous blood work.  Cardiovascular symptoms and blood pressure control - No chest pain recently - Blood pressure has remained stable since recent medication change - Overall improvement in well-being since medication adjustment  Erectile dysfunction - Erectile dysfunction persists despite previous trials of medication - Able to achieve arousal initially but loses ability when needed  Mental health and bereavement - Struggling with mental health, particularly following the loss of his wife - Finds the anniversary of her death challenging to cope with mentally - Continues with daily activities physically but has difficulty coping emotionally - Has tried counseling but remains uncertain about its effectiveness  Lower urinary tract symptoms - Emily Ropes for bladder issues, but not every day - Reduction in sweet drink intake and increased water consumption have decreased frequency of bathroom visits  Lifestyle and social activity - Maintains an active lifestyle, frequently goes out and interacts with others - Finds social interaction and outings beneficial for well-being - Enjoys spending time with a younger friend and visiting the beach, which he finds uplifting  Medication adherence - Consistent with medication regimen - [facility]bt[facility] from [facility] every three months     ROS    Objective:     BP 128/78   Pulse 91   Ht 5' 6 (1.676 m)   Wt 208 lb (94.3 kg)   SpO2 98%   BMI 33.57 kg/m     Physical Exam Vitals and nursing note reviewed.  Constitutional:      Appearance: Normal appearance.  HENT:     Head: Normocephalic and atraumatic.  Eyes:     Conjunctiva/sclera: Conjunctivae normal.  Cardiovascular:     Rate and Rhythm: Normal rate and regular rhythm.  Pulmonary:     Effort: Pulmonary effort is normal.     Breath sounds: Normal breath sounds.  Skin:    General: Skin is warm and dry.  Neurological:     Mental Status: He is alert.  Psychiatric:        Mood and Affect: Mood normal.      No results found for any visits on 08/05/24.    The 10-year ASCVD risk score (Arnett DK, et al., 2019) is: 21.5%    Assessment & Plan:   Problem List Items Addressed This Visit       Cardiovascular and Mediastinum   Essential hypertension - Primary   Essential hypertension Blood pressure well-controlled post-medication adjustment.       Relevant Medications   sildenafil  (VIAGRA ) 100 MG tablet   Other Relevant Orders   CMP14+EGFR     Endocrine   IFG (impaired fasting glucose)   Lat A1C looked great!!       Relevant Orders   CMP14+EGFR     Genitourinary   OAB (overactive bladder)   Overactive bladder Gemtesa effective in reducing urgency and frequency. Decreased sweet drink intake beneficial.      CKD stage G3a/A1, GFR 45-59 and albumin creatinine ratio <30 mg/g (HCC)  Chronic kidney disease, stage 3a Previous labs lacked CMP. Emphasized importance of monitoring kidney function due to medication interactions. - Ordered CMP to assess kidney and liver function.       Relevant Orders   CMP14+EGFR     Other   Speech and language deficit as late effect of stroke   History of substance abuse (HCC)   Quit in 1998      Erectile dysfunction   Relevant Medications   sildenafil  (VIAGRA ) 100 MG tablet    Assessment and Plan Assessment & Plan Erectile dysfunction Experiences loss of function during intercourse. Psychological factors  discussed.     Return in about 5 months (around 01/03/2025) for Pre-diabetes, Hypertension.    Dorothyann Byars, MD [facility] at [facility]   "

## 2024-08-05 NOTE — Assessment & Plan Note (Signed)
 Chronic kidney disease, stage 3a Previous labs lacked CMP. Emphasized importance of monitoring kidney function due to medication interactions. - Ordered CMP to assess kidney and liver function.

## 2024-08-05 NOTE — Assessment & Plan Note (Signed)
 Essential hypertension Blood pressure well-controlled post-medication adjustment.

## 2024-08-05 NOTE — Assessment & Plan Note (Signed)
 Lat A1C looked great!!

## 2024-08-05 NOTE — Patient Instructions (Signed)
 Jose Waller,  Thank you for taking the time for your Medicare Wellness Visit. I appreciate your continued commitment to your health goals. Please review the care plan we discussed, and feel free to reach out if I can assist you further.  Please note that Annual Wellness Visits do not include a physical exam. Some assessments may be limited, especially if the visit was conducted virtually. If needed, we may recommend an in-person follow-up with your provider.  Ongoing Care Seeing your primary care provider every 3 to 6 months helps us  monitor your health and provide consistent, personalized care.   Referrals If a referral was made during today's visit and you haven't received any updates within two weeks, please contact the referred provider directly to check on the status.  Recommended Screenings:  Health Maintenance  Topic Date Due   Pneumococcal Vaccine for age over 103 (1 of 2 - PCV) Never done   Zoster (Shingles) Vaccine (1 of 2) 12/14/1998   DTaP/Tdap/Td vaccine (2 - Tdap) 05/02/2018   COVID-19 Vaccine (4 - 2025-26 season) 03/29/2024   Medicare Annual Wellness Visit  05/05/2024   Colon Cancer Screening  08/29/2024   Flu Shot  10/26/2024*   Hepatitis C Screening  Completed   Meningitis B Vaccine  Aged Out  *Topic was postponed. The date shown is not the original due date.       08/05/2024    8:28 AM  Advanced Directives  Does Patient Have a Medical Advance Directive? No  Would patient like information on creating a medical advance directive? No - Patient declined    Vision: Annual vision screenings are recommended for early detection of glaucoma, cataracts, and diabetic retinopathy. These exams can also reveal signs of chronic conditions such as diabetes and high blood pressure.  Dental: Annual dental screenings help detect early signs of oral cancer, gum disease, and other conditions linked to overall health, including heart disease and diabetes.  Please see the attached  documents for additional preventive care recommendations.

## 2024-08-05 NOTE — Progress Notes (Signed)
 "  Chief Complaint  Patient presents with   Medicare Wellness     Subjective:   Jose Waller is a 74 y.o. male who presents for a Medicare Annual Wellness Visit.  Visit info / Clinical Intake: Medicare Wellness Visit Type:: Subsequent Annual Wellness Visit Persons participating in visit and providing information:: patient Medicare Wellness Visit Mode:: In-person (required for WTM) Interpreter Needed?: No Pre-visit prep was completed: yes AWV questionnaire completed by patient prior to visit?: no Living arrangements:: (!) lives alone Patient's Overall Health Status Rating: excellent Typical amount of pain: none Does pain affect daily life?: no Are you currently prescribed opioids?: no  Dietary Habits and Nutritional Risks How many meals a day?: (!) 1 Eats fruit and vegetables daily?: (!) no Most meals are obtained by: eating out In the last 2 weeks, have you had any of the following?: none Diabetic:: no  Functional Status Activities of Daily Living (to include ambulation/medication): Independent Ambulation: Independent Medication Administration: Independent Home Management (perform basic housework or laundry): Independent Manage your own finances?: yes Primary transportation is: driving Concerns about vision?: no *vision screening is required for WTM* Concerns about hearing?: no  Fall Screening Falls in the past year?: 0 Number of falls in past year: 0 Was there an injury with Fall?: 0 Fall Risk Category Calculator: 0 Patient Fall Risk Level: Low Fall Risk  Fall Risk Patient at Risk for Falls Due to: No Fall Risks Fall risk Follow up: Falls evaluation completed  Home and Transportation Safety: All rugs have non-skid backing?: yes All stairs or steps have railings?: yes Grab bars in the bathtub or shower?: yes Have non-skid surface in bathtub or shower?: yes Good home lighting?: yes Regular seat belt use?: yes Hospital stays in the last year:: no  Cognitive  Assessment Difficulty concentrating, remembering, or making decisions? : no Will 6CIT or Mini Cog be Completed: yes What year is it?: 0 points What month is it?: 0 points Give patient an address phrase to remember (5 components): [address] [place], [place] About what time is it?: 0 points Count backwards from 20 to 1: 0 points Say the months of the year in reverse: 2 points Repeat the address phrase from earlier: 0 points 6 CIT Score: 2 points  Advance Directives (For Healthcare) Does Patient Have a Medical Advance Directive?: No Would patient like information on creating a medical advance directive?: No - Patient declined  Reviewed/Updated  Reviewed/Updated: Medical History; Surgical History; Medications; Allergies; Care Teams; Patient Goals    Allergies (verified) Prednisone   Current Medications (verified) Outpatient Encounter Medications as of 08/05/2024  Medication Sig   amLODipine  (NORVASC ) 10 MG tablet Take 1 tablet (10 mg total) by mouth daily.   aspirin  EC 81 MG tablet Take 1 tablet (81 mg total) by mouth daily. Swallow whole.   atorvastatin  (LIPITOR) 20 MG tablet TAKE 1 TABLET BY MOUTH AT BEDTIME   celecoxib  (CELEBREX ) 200 MG capsule TAKE 1 TO 2 CAPSULES BY MOUTH ONCE DAILY AS NEEDED FOR PAIN   GEMTESA 75 MG TABS Take 1 tablet by mouth daily.   nitroGLYCERIN  (NITROSTAT ) 0.4 MG SL tablet Place 1 tablet (0.4 mg total) under the tongue every 5 (five) minutes as needed.   omeprazole  (PRILOSEC) 40 MG capsule Take 1 capsule by mouth once daily   sildenafil  (VIAGRA ) 100 MG tablet Take 0.5-1 tablets (50-100 mg total) by mouth daily as needed for erectile dysfunction.   No facility-administered encounter medications on file as of 08/05/2024.  History: Past Medical History:  Diagnosis Date   Allergy    Anorexia 02/09/2020   Arthritis    Cataract    MD just watching   Chronic pain of left knee 02/21/2022   CKD (chronic kidney disease) stage 3, GFR 30-59 ml/min (HCC)  06/05/2018   CKD stage G3a/A1, GFR 45-59 and albumin creatinine ratio <30 mg/g (HCC) 06/05/2018   Cluster headache    resolved   Combined forms of age-related cataract of left eye 09/23/2019   Erectile dysfunction 04/23/2021   GERD 11/08/2009   Qualifier: Diagnosis of   By: Alvan MD, Catherine         Glaucoma    Hyperlipidemia 12/31/2010   HYPERTENSION 04/22/2008   Qualifier: Diagnosis of   By: Merrell CMA, (AAMA), Alfonso         Hypertension    IFG (impaired fasting glucose) 10/23/2020   Low back pain 09/08/2013   Disability since 2005     Migraine 09/08/2013   OAB (overactive bladder) 10/20/2020   Tried Vesicare  andMyrbetriq.      Primary osteoarthritis of both knees 04/22/2019   Slurred speech 01/28/2019   Speech and language deficit as late effect of stroke 02/03/2024   Substance abuse (HCC)    cocaine - quit 1998   Urinary urgency 01/28/2019   Has tried oxybutynin , Enablex , Vesicare , and Myrbetriq  all are ineffective.     Past Surgical History:  Procedure Laterality Date   back lumbar  2004   fusion    COLONOSCOPY  06/09/2009   Dr Norleen Law - normal   WISDOM TOOTH EXTRACTION     Family History  Problem Relation Age of Onset   Lung cancer Father        lung/ was a hydrologist   Alcohol abuse Other    Hypertension Brother    Dementia Mother    Coronary artery disease Brother    Colon cancer Neg Hx    Stomach cancer Neg Hx    Rectal cancer Neg Hx    Social History   Occupational History   Occupation: retired    Comment: technical sales engineer  Tobacco Use   Smoking status: Former    Current packs/day: 0.00    Types: Cigarettes    Quit date: 07/29/2001    Years since quitting: 23.0   Smokeless tobacco: Never  Vaping Use   Vaping status: Never Used  Substance and Sexual Activity   Alcohol use: No   Drug use: Not Currently    Comment: Former cocaine user, quit 1998   Sexual activity: Yes   Tobacco Counseling Counseling given: Not Answered  SDOH  Screenings   Food Insecurity: No Food Insecurity (08/05/2024)  Housing: Low Risk (08/05/2024)  Transportation Needs: No Transportation Needs (08/05/2024)  Utilities: Not At Risk (08/05/2024)  Alcohol Screen: Low Risk (05/06/2023)  Depression (PHQ2-9): Low Risk (08/05/2024)  Financial Resource Strain: Low Risk (05/06/2023)  Physical Activity: Inactive (08/05/2024)  Social Connections: Moderately Integrated (08/05/2024)  Stress: No Stress Concern Present (08/05/2024)  Tobacco Use: Medium Risk (08/05/2024)  Health Literacy: Adequate Health Literacy (08/05/2024)   See flowsheets for full screening details  Depression Screen PHQ 2 & 9 Depression Scale- Over the past 2 weeks, how often have you been bothered by any of the following problems? Little interest or pleasure in doing things: 0 Feeling down, depressed, or hopeless (PHQ Adolescent also includes...irritable): 0 PHQ-2 Total Score: 0     Goals Addressed  This Visit's Progress    Patient Stated       Patient states he would like to speak better and stay healthy.              Objective:    Today's Vitals   08/05/24 0821  BP: 128/78  Pulse: 91  SpO2: 98%  Weight: 208 lb (94.3 kg)  Height: 5' 6 (1.676 m)   Body mass index is 33.57 kg/m.  Hearing/Vision screen No results found. Immunizations and Health Maintenance Health Maintenance  Topic Date Due   Pneumococcal Vaccine: 50+ Years (1 of 2 - PCV) Never done   Zoster Vaccines- Shingrix (1 of 2) 12/14/1998   DTaP/Tdap/Td (2 - Tdap) 05/02/2018   COVID-19 Vaccine (4 - 2025-26 season) 03/29/2024   Colonoscopy  08/29/2024   Influenza Vaccine  10/26/2024 (Originally 02/27/2024)   Medicare Annual Wellness (AWV)  08/05/2025   Hepatitis C Screening  Completed   Meningococcal B Vaccine  Aged Out        Assessment/Plan:  This is a routine wellness examination for Jose Waller.  Patient Care Team: Alvan Dorothyann BIRCH, MD as PCP - General Revankar, Jennifer SAUNDERS, MD as Consulting  Physician (Cardiology)  I have personally reviewed and noted the following in the patients chart:   Medical and social history Use of alcohol, tobacco or illicit drugs  Current medications and supplements including opioid prescriptions. Functional ability and status Nutritional status Physical activity Advanced directives List of other physicians Hospitalizations, surgeries, and ER visits in previous 12 months Vitals Screenings to include cognitive, depression, and falls Referrals and appointments  No orders of the defined types were placed in this encounter.  In addition, I have reviewed and discussed with patient certain preventive protocols, quality metrics, and best practice recommendations. A written personalized care plan for preventive services as well as general preventive health recommendations were provided to patient.   Jose Waller, CMA   08/05/2024   Return in 1 year (on 08/05/2025).  After Visit Summary: (In Person-Declined) Patient declined AVS at this time.  Nurse Notes:   Jose Waller is a 74 y.o. male patient of Metheney, Dorothyann BIRCH, MD who had a Medicare Annual Wellness Visit today. Jose Waller is Retired and lives alone. He has 6 children. He reports that he is socially active and does interact with friends/family regularly. He is minimally physically active and enjoys going to the flea market.    "

## 2024-08-05 NOTE — Assessment & Plan Note (Signed)
 Overactive bladder Gemtesa effective in reducing urgency and frequency. Decreased sweet drink intake beneficial.

## 2024-08-05 NOTE — Assessment & Plan Note (Signed)
 Quit in 1998

## 2024-08-06 ENCOUNTER — Other Ambulatory Visit: Payer: Self-pay | Admitting: Family Medicine

## 2024-08-06 ENCOUNTER — Ambulatory Visit: Payer: Self-pay | Admitting: Family Medicine

## 2024-08-06 DIAGNOSIS — I10 Essential (primary) hypertension: Secondary | ICD-10-CM

## 2024-08-06 LAB — CMP14+EGFR
ALT: 11 IU/L (ref 0–44)
AST: 20 IU/L (ref 0–40)
Albumin: 4.8 g/dL (ref 3.8–4.8)
Alkaline Phosphatase: 102 IU/L (ref 47–123)
BUN/Creatinine Ratio: 8 — ABNORMAL LOW (ref 10–24)
BUN: 11 mg/dL (ref 8–27)
Bilirubin Total: 0.5 mg/dL (ref 0.0–1.2)
CO2: 23 mmol/L (ref 20–29)
Calcium: 10.2 mg/dL (ref 8.6–10.2)
Chloride: 104 mmol/L (ref 96–106)
Creatinine, Ser: 1.36 mg/dL — ABNORMAL HIGH (ref 0.76–1.27)
Globulin, Total: 2.8 g/dL (ref 1.5–4.5)
Glucose: 94 mg/dL (ref 70–99)
Potassium: 4.5 mmol/L (ref 3.5–5.2)
Sodium: 141 mmol/L (ref 134–144)
Total Protein: 7.6 g/dL (ref 6.0–8.5)
eGFR: 54 mL/min/1.73 — ABNORMAL LOW

## 2024-08-06 NOTE — Progress Notes (Signed)
 [place], kidney function back to 1.3 which is your baseline that is great had jumped up 2 months ago which is had rechecked it a little sooner rather than later but it looks like it is back to baseline.

## 2024-08-08 ENCOUNTER — Other Ambulatory Visit: Payer: Self-pay | Admitting: Family Medicine

## 2024-08-13 ENCOUNTER — Telehealth (HOSPITAL_COMMUNITY): Payer: Self-pay | Admitting: *Deleted

## 2024-08-13 NOTE — Telephone Encounter (Signed)
 Spoke to patient as a reminder about his STRESS TEST on 08/20/24 at 7:45. Patient stated that he has a bad cold and will give us  a call if he is not feeling any better to keep his appointment.

## 2024-08-17 ENCOUNTER — Other Ambulatory Visit: Payer: Self-pay | Admitting: Cardiology

## 2024-08-17 DIAGNOSIS — I259 Chronic ischemic heart disease, unspecified: Secondary | ICD-10-CM

## 2024-08-18 ENCOUNTER — Ambulatory Visit (HOSPITAL_BASED_OUTPATIENT_CLINIC_OR_DEPARTMENT_OTHER)
Admission: RE | Admit: 2024-08-18 | Discharge: 2024-08-18 | Disposition: A | Discharge status: Home / Self Care | Source: Ambulatory Visit | Attending: Cardiology | Admitting: Cardiology

## 2024-08-18 DIAGNOSIS — I209 Angina pectoris, unspecified: Secondary | ICD-10-CM | POA: Diagnosis not present

## 2024-08-18 DIAGNOSIS — R0609 Other forms of dyspnea: Secondary | ICD-10-CM | POA: Diagnosis not present

## 2024-08-18 LAB — ECHOCARDIOGRAM COMPLETE
AR max vel: 1.41 cm2
AV Area VTI: 1.6 cm2
AV Area mean vel: 1.55 cm2
AV Mean grad: 5.5 mmHg
AV Peak grad: 10.4 mmHg
Ao pk vel: 1.61 m/s
Area-P 1/2: 5.62 cm2
Calc EF: 66.6 %
MV M vel: 4.64 m/s
MV Peak grad: 86.1 mmHg
S' Lateral: 2.4 cm
Single Plane A2C EF: 66.6 %
Single Plane A4C EF: 66.4 %

## 2024-08-20 ENCOUNTER — Ambulatory Visit (HOSPITAL_COMMUNITY)
Admission: RE | Admit: 2024-08-20 | Discharge: 2024-08-20 | Disposition: A | Source: Ambulatory Visit | Attending: Cardiology | Admitting: Cardiology

## 2024-08-20 ENCOUNTER — Ambulatory Visit: Payer: Self-pay | Admitting: Cardiology

## 2024-08-20 DIAGNOSIS — I259 Chronic ischemic heart disease, unspecified: Secondary | ICD-10-CM | POA: Insufficient documentation

## 2024-08-20 LAB — MYOCARDIAL PERFUSION IMAGING
LV dias vol: 65 mL (ref 62–150)
LV sys vol: 14 mL
Nuc Stress EF: 78 %
Peak HR: 113 {beats}/min
Rest HR: 100 {beats}/min
Rest Nuclear Isotope Dose: 10.7 mCi
SDS: 1
SRS: 14
SSS: 13
ST Depression (mm): 0 mm
Stress Nuclear Isotope Dose: 32.6 mCi
TID: 1.41

## 2024-08-20 MED ORDER — TECHNETIUM TC 99M TETROFOSMIN IV KIT
32.6000 | PACK | Freq: Once | INTRAVENOUS | Status: AC | PRN
  Administered 2024-08-20: 32.6 via INTRAVENOUS

## 2024-08-20 MED ORDER — REGADENOSON 0.4 MG/5ML IV SOLN
INTRAVENOUS | Status: AC
  Filled 2024-08-20: qty 5

## 2024-08-20 MED ORDER — REGADENOSON 0.4 MG/5ML IV SOLN
0.4000 mg | Freq: Once | INTRAVENOUS | Status: AC
  Administered 2024-08-20: 0.4 mg via INTRAVENOUS

## 2024-08-20 MED ORDER — TECHNETIUM TC 99M TETROFOSMIN IV KIT
10.7000 | PACK | Freq: Once | INTRAVENOUS | Status: AC | PRN
  Administered 2024-08-20: 10.7 via INTRAVENOUS

## 2024-08-24 ENCOUNTER — Ambulatory Visit: Payer: Self-pay | Admitting: Cardiology

## 2024-12-01 ENCOUNTER — Ambulatory Visit: Admitting: Family Medicine

## 2025-08-10 ENCOUNTER — Ambulatory Visit
